# Patient Record
Sex: Male | Born: 1960 | Race: White | Hispanic: No | Marital: Married | Smoking: Former smoker
Health system: Southern US, Community
[De-identification: ages and names within clinical notes are randomized; demographics above are authoritative.]

## PROBLEM LIST (undated history)

## (undated) DIAGNOSIS — E785 Hyperlipidemia, unspecified: Secondary | ICD-10-CM

## (undated) DIAGNOSIS — Z86718 Personal history of other venous thrombosis and embolism: Secondary | ICD-10-CM

## (undated) DIAGNOSIS — I2699 Other pulmonary embolism without acute cor pulmonale: Secondary | ICD-10-CM

## (undated) DIAGNOSIS — R0600 Dyspnea, unspecified: Secondary | ICD-10-CM

## (undated) DIAGNOSIS — R0609 Other forms of dyspnea: Secondary | ICD-10-CM

## (undated) HISTORY — DX: Other forms of dyspnea: R06.09

## (undated) HISTORY — PX: TONSILLECTOMY AND ADENOIDECTOMY: SUR1326

## (undated) HISTORY — DX: Dyspnea, unspecified: R06.00

## (undated) HISTORY — PX: APPENDECTOMY: SHX54

---

## 2009-03-28 DIAGNOSIS — I2699 Other pulmonary embolism without acute cor pulmonale: Secondary | ICD-10-CM

## 2009-03-28 HISTORY — DX: Other pulmonary embolism without acute cor pulmonale: I26.99

## 2011-09-12 ENCOUNTER — Emergency Department: Payer: Self-pay | Admitting: *Deleted

## 2011-09-12 LAB — HCG, QUANTITATIVE, PREGNANCY: Beta Hcg, Quant.: <1 m[IU]/mL — ABNORMAL LOW

## 2012-02-28 DIAGNOSIS — F524 Premature ejaculation: Secondary | ICD-10-CM | POA: Insufficient documentation

## 2012-02-28 DIAGNOSIS — N529 Male erectile dysfunction, unspecified: Secondary | ICD-10-CM | POA: Insufficient documentation

## 2012-02-28 DIAGNOSIS — E7800 Pure hypercholesterolemia, unspecified: Secondary | ICD-10-CM | POA: Insufficient documentation

## 2012-02-28 DIAGNOSIS — Z86711 Personal history of pulmonary embolism: Secondary | ICD-10-CM | POA: Insufficient documentation

## 2012-03-07 DIAGNOSIS — Z720 Tobacco use: Secondary | ICD-10-CM | POA: Insufficient documentation

## 2012-03-07 DIAGNOSIS — M549 Dorsalgia, unspecified: Secondary | ICD-10-CM | POA: Insufficient documentation

## 2012-03-07 DIAGNOSIS — M79673 Pain in unspecified foot: Secondary | ICD-10-CM | POA: Insufficient documentation

## 2013-01-23 DIAGNOSIS — K635 Polyp of colon: Secondary | ICD-10-CM | POA: Insufficient documentation

## 2015-11-01 ENCOUNTER — Encounter: Payer: Self-pay | Admitting: Emergency Medicine

## 2015-11-01 ENCOUNTER — Emergency Department: Payer: BLUE CROSS/BLUE SHIELD

## 2015-11-01 ENCOUNTER — Emergency Department
Admission: EM | Admit: 2015-11-01 | Discharge: 2015-11-01 | Disposition: A | Discharge status: Home / Self Care | Payer: BLUE CROSS/BLUE SHIELD | Attending: Emergency Medicine | Admitting: Emergency Medicine

## 2015-11-01 DIAGNOSIS — W010XXA Fall on same level from slipping, tripping and stumbling without subsequent striking against object, initial encounter: Secondary | ICD-10-CM | POA: Diagnosis not present

## 2015-11-01 DIAGNOSIS — Y9259 Other trade areas as the place of occurrence of the external cause: Secondary | ICD-10-CM | POA: Insufficient documentation

## 2015-11-01 DIAGNOSIS — Y9301 Activity, walking, marching and hiking: Secondary | ICD-10-CM | POA: Diagnosis not present

## 2015-11-01 DIAGNOSIS — S99912A Unspecified injury of left ankle, initial encounter: Secondary | ICD-10-CM | POA: Diagnosis present

## 2015-11-01 DIAGNOSIS — S8265XA Nondisplaced fracture of lateral malleolus of left fibula, initial encounter for closed fracture: Secondary | ICD-10-CM | POA: Diagnosis not present

## 2015-11-01 DIAGNOSIS — S8262XA Displaced fracture of lateral malleolus of left fibula, initial encounter for closed fracture: Secondary | ICD-10-CM

## 2015-11-01 DIAGNOSIS — Y999 Unspecified external cause status: Secondary | ICD-10-CM | POA: Diagnosis not present

## 2015-11-01 MED ORDER — OXYCODONE-ACETAMINOPHEN 5-325 MG PO TABS
1.0000 | ORAL_TABLET | Freq: Once | ORAL | Status: AC
  Administered 2015-11-01: 1 via ORAL
  Filled 2015-11-01: qty 1

## 2015-11-01 MED ORDER — OXYCODONE-ACETAMINOPHEN 5-325 MG PO TABS: 1.0000 | ORAL_TABLET | ORAL | 0 refills | Status: DC | PRN

## 2015-11-01 NOTE — Discharge Instructions (Signed)
Ice and elevate your ankle. Use crutches for walking.  Leave splint on until seen by specialist.   Percocet as needed for pain as directed. Call Dr. Poggi/Dr. Troxler's office for an appointment.

## 2015-11-01 NOTE — ED Provider Notes (Signed)
[facility] Emergency Department Provider Note  ____________________________________________   First MD Initiated Contact with Patient 11/01/15 1633     (approximate)  I have reviewed the triage vital signs and the nursing notes.   HISTORY  Chief Complaint Ankle Injury   HPI Frank Rivera is a 55 y.o. male is here with complaint of left ankle pain. Patient states that he was out in his crotch when his right foot twisted on some cardboard causing him to fall injuring his left ankle. Patient denies any previous ankle fractures. Patient has not taken any over-the-counter medication prior to his arrival in the emergency room. He denies any head injury or loss of consciousness. There is been no nausea or vomiting.Range of motion increases patient's pain. Currently he rates his pain as 10 over 10. He denies any other injuries.   History reviewed. No pertinent past medical history.  There are no active problems to display for this patient.   History reviewed. No pertinent surgical history.  Prior to Admission medications   Medication Sig Start Date End Date Taking? Authorizing Provider  oxyCODONE-acetaminophen (PERCOCET) 5-325 MG tablet Take 1-2 tablets by mouth every 4 (four) hours as needed for severe pain. 11/01/15   Tommi Rumpshonda L Doreatha Offer, PA-C    Allergies Review of patient's allergies indicates no known allergies.  No family history on file.  Social History Social History  Substance Use Topics  . Smoking status: Never Smoker  . Smokeless tobacco: Never Used  . Alcohol use Not on file    Review of Systems Constitutional: No fever/chills Cardiovascular: Denies chest pain. Respiratory: Denies shortness of breath. Gastrointestinal:   No nausea, no vomiting.  Musculoskeletal: Negative for back pain. Positive left ankle pain. Skin: Negative for rash. Neurological: Negative for headaches, focal weakness or numbness.  10-point ROS otherwise  negative.  ____________________________________________   PHYSICAL EXAM:  VITAL SIGNS: ED Triage Vitals  Enc Vitals Group     BP 11/01/15 1626 (!) 150/63     Pulse --      Resp 11/01/15 1626 16     Temp 11/01/15 1626 97.8 F (36.6 C)     Temp Source 11/01/15 1626 Oral     SpO2 11/01/15 1626 97 %     Weight 11/01/15 1626 240 lb (108.9 kg)     Height 11/01/15 1626 5\' 9"  (1.753 m)     Head Circumference --      Peak Flow --      Pain Score 11/01/15 1624 10     Pain Loc --      Pain Edu? --      Excl. in GC? --     Constitutional: Alert and oriented. Well appearing and in no acute distress. Eyes: Conjunctivae are normal. PERRL. EOMI. Head: Atraumatic. Nose: No congestion/rhinnorhea. Neck: No stridor.   Cardiovascular: Normal rate, regular rhythm. Grossly normal heart sounds.  Good peripheral circulation. Respiratory: Normal respiratory effort.  No retractions. Lungs CTAB. Musculoskeletal: Examination of the left ankle there is moderate tenderness on palpation of the lateral aspect with soft tissue edema present. Range of motion is restricted secondary to discomfort. There is no injury to the skin, no ecchymosis, no abrasions, no erythema. Skin is warm and dry and pulses positive. Motor sensory function intact. Neurologic:  Normal speech and language. No gross focal neurologic deficits are appreciated. No gait instability. Skin:  Skin is warm, dry and intact. As above with muscle skeletal. Psychiatric: Mood and affect are normal. Speech and  behavior are normal.  ____________________________________________   LABS (all labs ordered are listed, but only abnormal results are displayed)  Labs Reviewed - No data to display RADIOLOGY  Left ankle x-ray per radiologist Nondisplaced lateral malleolus fracture. I, Tommi Rumps, personally viewed and evaluated these images (plain radiographs) as part of my medical decision making, as well as reviewing the written report by the  radiologist. ____________________________________________   PROCEDURES  Procedure(s) performed: None  Procedures  Critical Care performed: No  ____________________________________________   INITIAL IMPRESSION / ASSESSMENT AND PLAN / ED COURSE  Pertinent labs & imaging results that were available during my care of the patient were reviewed by me and considered in my medical decision making (see chart for details).    Clinical Course   Patient was given Percocet for pain while in the emergency room. Patient was placed in a posterior OCL splint along with a stirrup splint and crutches. Patient is to keep the extremity elevated to decrease swelling. He will continue his Percocet as needed for pain. He also has seen Dr. Orland Jarred in the past for various reasons and will call him/Poggi for further treatment of his fractured ankle.  ____________________________________________   FINAL CLINICAL IMPRESSION(S) / ED DIAGNOSES  Final diagnoses:  Ankle fracture, lateral malleolus, closed, left, initial encounter      NEW MEDICATIONS STARTED DURING THIS VISIT:  New Prescriptions   OXYCODONE-ACETAMINOPHEN (PERCOCET) 5-325 MG TABLET    Take 1-2 tablets by mouth every 4 (four) hours as needed for severe pain.     Note:  This document was prepared using Dragon voice recognition software and may include unintentional dictation errors.    Tommi Rumps, PA-C 11/01/15 1810    Phineas Semen, MD 11/09/15 [phone]

## 2015-11-01 NOTE — ED Triage Notes (Signed)
Slipped while walking in garage, injured left ankle

## 2015-11-17 ENCOUNTER — Encounter: Payer: Self-pay | Admitting: Emergency Medicine

## 2015-11-17 ENCOUNTER — Emergency Department: Payer: BLUE CROSS/BLUE SHIELD

## 2015-11-17 ENCOUNTER — Inpatient Hospital Stay
Admission: EM | Admit: 2015-11-17 | Discharge: 2015-11-23 | DRG: 175 | Disposition: A | Discharge status: Home / Self Care | Payer: BLUE CROSS/BLUE SHIELD | Attending: Internal Medicine | Admitting: Internal Medicine

## 2015-11-17 DIAGNOSIS — Z79899 Other long term (current) drug therapy: Secondary | ICD-10-CM | POA: Diagnosis not present

## 2015-11-17 DIAGNOSIS — S82892A Other fracture of left lower leg, initial encounter for closed fracture: Secondary | ICD-10-CM | POA: Diagnosis present

## 2015-11-17 DIAGNOSIS — X58XXXA Exposure to other specified factors, initial encounter: Secondary | ICD-10-CM | POA: Diagnosis present

## 2015-11-17 DIAGNOSIS — E785 Hyperlipidemia, unspecified: Secondary | ICD-10-CM | POA: Diagnosis present

## 2015-11-17 DIAGNOSIS — I248 Other forms of acute ischemic heart disease: Secondary | ICD-10-CM | POA: Diagnosis present

## 2015-11-17 DIAGNOSIS — Z86711 Personal history of pulmonary embolism: Secondary | ICD-10-CM

## 2015-11-17 DIAGNOSIS — R000 Tachycardia, unspecified: Secondary | ICD-10-CM | POA: Diagnosis present

## 2015-11-17 DIAGNOSIS — Z882 Allergy status to sulfonamides status: Secondary | ICD-10-CM | POA: Diagnosis not present

## 2015-11-17 DIAGNOSIS — I82402 Acute embolism and thrombosis of unspecified deep veins of left lower extremity: Secondary | ICD-10-CM | POA: Diagnosis present

## 2015-11-17 DIAGNOSIS — R0602 Shortness of breath: Secondary | ICD-10-CM | POA: Diagnosis present

## 2015-11-17 DIAGNOSIS — J9601 Acute respiratory failure with hypoxia: Secondary | ICD-10-CM | POA: Diagnosis present

## 2015-11-17 DIAGNOSIS — D72829 Elevated white blood cell count, unspecified: Secondary | ICD-10-CM | POA: Diagnosis present

## 2015-11-17 DIAGNOSIS — M7989 Other specified soft tissue disorders: Secondary | ICD-10-CM | POA: Diagnosis present

## 2015-11-17 DIAGNOSIS — F17210 Nicotine dependence, cigarettes, uncomplicated: Secondary | ICD-10-CM | POA: Diagnosis present

## 2015-11-17 DIAGNOSIS — I2699 Other pulmonary embolism without acute cor pulmonale: Principal | ICD-10-CM | POA: Diagnosis present

## 2015-11-17 DIAGNOSIS — R609 Edema, unspecified: Secondary | ICD-10-CM

## 2015-11-17 DIAGNOSIS — R9389 Abnormal findings on diagnostic imaging of other specified body structures: Secondary | ICD-10-CM

## 2015-11-17 HISTORY — DX: Other pulmonary embolism without acute cor pulmonale: I26.99

## 2015-11-17 LAB — PROTIME-INR
INR: 1.01
Prothrombin Time: 13.3 seconds (ref 11.4–15.2)

## 2015-11-17 LAB — BRAIN NATRIURETIC PEPTIDE: B Natriuretic Peptide: 27 pg/mL (ref 0.0–100.0)

## 2015-11-17 LAB — CBC
HCT: 39.5 % — ABNORMAL LOW (ref 40.0–52.0)
Hemoglobin: 14 g/dL (ref 13.0–18.0)
MCH: 33.5 pg (ref 26.0–34.0)
MCHC: 35.5 g/dL (ref 32.0–36.0)
MCV: 94.4 fL (ref 80.0–100.0)
PLATELETS: 270 10*3/uL (ref 150–440)
RBC: 4.18 MIL/uL — ABNORMAL LOW (ref 4.40–5.90)
RDW: 12.2 % (ref 11.5–14.5)
WBC: 8.6 10*3/uL (ref 3.8–10.6)

## 2015-11-17 LAB — BASIC METABOLIC PANEL
Anion gap: 10 (ref 5–15)
BUN: 13 mg/dL (ref 6–20)
CALCIUM: 9.3 mg/dL (ref 8.9–10.3)
CO2: 27 mmol/L (ref 22–32)
CREATININE: 0.73 mg/dL (ref 0.61–1.24)
Chloride: 103 mmol/L (ref 101–111)
GFR calc Af Amer: >60 mL/min (ref >60)
GFR calc non Af Amer: >60 mL/min (ref >60)
GLUCOSE: 122 mg/dL — AB (ref 65–99)
Potassium: 3.5 mmol/L (ref 3.5–5.1)
Sodium: 140 mmol/L (ref 135–145)

## 2015-11-17 LAB — TROPONIN I

## 2015-11-17 MED ORDER — ACETAMINOPHEN 325 MG PO TABS
650.0000 mg | ORAL_TABLET | Freq: Four times a day (QID) | ORAL | Status: DC | PRN
Start: 2015-11-17 — End: 2015-11-23
  Administered 2015-11-18: 650 mg via ORAL
  Filled 2015-11-17: qty 2

## 2015-11-17 MED ORDER — ACETAMINOPHEN 650 MG RE SUPP: 650.0000 mg | Freq: Four times a day (QID) | RECTAL | Status: DC | PRN

## 2015-11-17 MED ORDER — ONDANSETRON HCL 4 MG PO TABS: 4.0000 mg | ORAL_TABLET | Freq: Four times a day (QID) | ORAL | Status: DC | PRN

## 2015-11-17 MED ORDER — SODIUM CHLORIDE 0.9% FLUSH
3.0000 mL | Freq: Two times a day (BID) | INTRAVENOUS | Status: DC
  Administered 2015-11-17 – 2015-11-23 (×10): 3 mL via INTRAVENOUS

## 2015-11-17 MED ORDER — IOPAMIDOL (ISOVUE-370) INJECTION 76%
75.0000 mL | Freq: Once | INTRAVENOUS | Status: AC | PRN
  Administered 2015-11-17: 75 mL via INTRAVENOUS

## 2015-11-17 MED ORDER — ONDANSETRON HCL 4 MG/2ML IJ SOLN: 4.0000 mg | Freq: Four times a day (QID) | INTRAMUSCULAR | Status: DC | PRN

## 2015-11-17 MED ORDER — IPRATROPIUM-ALBUTEROL 0.5-2.5 (3) MG/3ML IN SOLN
3.0000 mL | Freq: Once | RESPIRATORY_TRACT | Status: AC
  Administered 2015-11-17: 3 mL via RESPIRATORY_TRACT
  Filled 2015-11-17: qty 3

## 2015-11-17 MED ORDER — FENTANYL CITRATE (PF) 100 MCG/2ML IJ SOLN
100.0000 ug | INTRAMUSCULAR | Status: DC | PRN
  Administered 2015-11-17 (×3): 100 ug via INTRAVENOUS
  Filled 2015-11-17 (×3): qty 2

## 2015-11-17 MED ORDER — HEPARIN (PORCINE) IN NACL 100-0.45 UNIT/ML-% IJ SOLN
2500.0000 [IU]/h | INTRAMUSCULAR | Status: DC
  Administered 2015-11-17: 1600 [IU]/h via INTRAVENOUS
  Administered 2015-11-18: 1950 [IU]/h via INTRAVENOUS
  Administered 2015-11-19: 2150 [IU]/h via INTRAVENOUS
  Administered 2015-11-19 – 2015-11-23 (×9): 2500 [IU]/h via INTRAVENOUS
  Filled 2015-11-17 (×18): qty 250

## 2015-11-17 MED ORDER — HEPARIN SODIUM (PORCINE) 5000 UNIT/ML IJ SOLN: 4000.0000 [IU] | Freq: Once | INTRAMUSCULAR | Status: DC

## 2015-11-17 MED ORDER — SIMVASTATIN 40 MG PO TABS
40.0000 mg | ORAL_TABLET | Freq: Every day | ORAL | Status: DC
  Administered 2015-11-17 – 2015-11-23 (×7): 40 mg via ORAL
  Filled 2015-11-17 (×7): qty 1

## 2015-11-17 MED ORDER — HEPARIN BOLUS VIA INFUSION
5000.0000 [IU] | Freq: Once | INTRAVENOUS | Status: AC
  Administered 2015-11-17: 5000 [IU] via INTRAVENOUS
  Filled 2015-11-17: qty 5000

## 2015-11-17 MED ORDER — SODIUM CHLORIDE 0.9 % IV BOLUS (SEPSIS)
1000.0000 mL | Freq: Once | INTRAVENOUS | Status: AC
  Administered 2015-11-17: 1000 mL via INTRAVENOUS

## 2015-11-17 MED ORDER — OXYCODONE HCL 5 MG PO TABS
5.0000 mg | ORAL_TABLET | ORAL | Status: DC | PRN
  Administered 2015-11-17 – 2015-11-23 (×29): 5 mg via ORAL
  Filled 2015-11-17 (×30): qty 1

## 2015-11-17 NOTE — ED Notes (Signed)
Patient transported to CT 

## 2015-11-17 NOTE — Progress Notes (Addendum)
ANTICOAGULATION CONSULT NOTE - Initial Consult  Pharmacy Consult for heparin  Indication: pulmonary embolus  Allergies  Allergen Reactions  . Sulfa Antibiotics Other (See Comments)    Patient Measurements: Height: 5\' 9"  (175.3 cm) Weight: 240 lb (108.9 kg) IBW/kg (Calculated) : 70.7 Heparin Dosing Weight: 94.5 kg  Vital Signs: Temp: 98.2 F (36.8 C) (08/22 1655) Temp Source: Oral (08/22 1655) BP: 130/71 (08/22 1727) Pulse Rate: 101 (08/22 1815)  Labs:  Recent Labs  11/17/15 1657 11/17/15 1736  HGB 14.0  --   HCT 39.5*  --   PLT 270  --   LABPROT  --  13.3  INR  --  1.01  CREATININE 0.73  --   TROPONINI <0.03  --     Estimated Creatinine Clearance: 129.9 mL/min (by C-G formula based on SCr of 0.8 mg/dL).   Medical History: Past Medical History:  Diagnosis Date  . Pulmonary embolism (HCC) 2011    Medications:  Scheduled:  . heparin  5,000 Units Intravenous Once  . sodium chloride flush  3 mL Intravenous Q12H    Assessment: 55 yo male who presents to the ED with chest pain and SOB. Not any any previous anticoagulants prior to admission.   Goal of Therapy:  Heparin level 0.3-0.7 units/ml Monitor platelets by anticoagulation protocol: Yes   Plan:  Give 5000 units bolus x 1 Start heparin infusion at 1600 units/hr Check anti-Xa level in 6 hours and daily while on heparin Continue to monitor H&H and platelets  Delsa BernKelly m Fuhrmann 11/17/2015,7:06 PM

## 2015-11-17 NOTE — H&P (Signed)
[facility]   PATIENT NAME: Frank Rivera    MR#:  [id number]  DATE OF BIRTH:  11/11/1960   DATE OF ADMISSION:  11/17/2015  PRIMARY CARE PHYSICIAN: No PCP Per Patient   REQUESTING/REFERRING PHYSICIAN: Roxan Hockeyobinson  CHIEF COMPLAINT:   Chief Complaint  Patient presents with  . Chest Pain  . Shortness of Breath    HISTORY OF PRESENT ILLNESS:  Frank BoastGary Rivera  is a 55 y.o. male with a known history of Prior provoked pulmonary embolism back in 2001 who is presenting with chest pain shortness of breath. Recently suffered an ankle fracture a few weeks ago he has essentially been immobile since mainly using a wheelchair. He is now complaining of 2-3 day duration of chest pain shortness of breath. Chest pain originally right sided pleuritic in nature also worse with movement sharp in quality and intensity 7/10. Associated shortness of breath mainly with activity. Present Hospital further workup and evaluation.  PAST MEDICAL HISTORY:   Past Medical History:  Diagnosis Date  . Pulmonary emboli (HCC)   . Pulmonary embolism (HCC) 2011    PAST SURGICAL HISTORY:  History reviewed. No pertinent surgical history.  SOCIAL HISTORY:   Social History  Substance Use Topics  . Smoking status: Current Every Day Smoker    Types: Cigarettes  . Smokeless tobacco: Never Used  . Alcohol use No    FAMILY HISTORY:   Family History  Problem Relation Age of Onset  . Diabetes Neg Hx     DRUG ALLERGIES:   Allergies  Allergen Reactions  . Sulfa Antibiotics Other (See Comments)    REVIEW OF SYSTEMS:  REVIEW OF SYSTEMS:  CONSTITUTIONAL: Denies fevers, chills, fatigue, weakness.  EYES: Denies blurred vision, double vision, or eye pain.  EARS, NOSE, THROAT: Denies tinnitus, ear pain, hearing loss.  RESPIRATORY: denies cough, Positive shortness of breath, denies wheezing  CARDIOVASCULAR: Positive chest pain, denies palpitations, edema.  GASTROINTESTINAL: Denies  nausea, vomiting, diarrhea, abdominal pain.  GENITOURINARY: Denies dysuria, hematuria.  ENDOCRINE: Denies nocturia or thyroid problems. HEMATOLOGIC AND LYMPHATIC: Denies easy bruising or bleeding.  SKIN: Denies rash or lesions.  MUSCULOSKELETAL: Denies pain in neck, back, shoulder, knees, hips, or further arthritic symptoms.  NEUROLOGIC: Denies paralysis, paresthesias.  PSYCHIATRIC: Denies anxiety or depressive symptoms. Otherwise full review of systems performed by me is negative.   MEDICATIONS AT HOME:   Prior to Admission medications   Medication Sig Start Date End Date Taking? Authorizing Provider  ibuprofen (ADVIL,MOTRIN) 200 MG tablet Take 200 mg by mouth every 6 (six) hours as needed.   Yes Historical Provider, MD  simvastatin (ZOCOR) 40 MG tablet Take 40 mg by mouth daily.   Yes Historical Provider, MD  oxyCODONE-acetaminophen (PERCOCET) 5-325 MG tablet Take 1-2 tablets by mouth every 4 (four) hours as needed for severe pain. Patient not taking: Reported on 11/17/2015 11/01/15   Tommi Rumpshonda L Summers, PA-C      VITAL SIGNS:  Blood pressure (!) 155/77, pulse (!) 117, temperature 98.2 F (36.8 C), temperature source Oral, resp. rate (!) 32, height 5\' 9"  (1.753 m), weight 108.9 kg (240 lb), SpO2 92 %.  PHYSICAL EXAMINATION:  VITAL SIGNS: Vitals:   11/17/15 1912 11/17/15 1913  BP: (!) 155/77   Pulse: (!) 116 (!) 117  Resp: (!) 32 (!) 32  Temp:     GENERAL:55 y.o.male currently in no acute distress.  HEAD: Normocephalic, atraumatic.  EYES: Pupils equal, round, reactive to light. Extraocular muscles intact. No  scleral icterus.  MOUTH: Moist mucosal membrane. Dentition intact. No abscess noted.  EAR, NOSE, THROAT: Clear without exudates. No external lesions.  NECK: Supple. No thyromegaly. No nodules. No JVD.  PULMONARY: Clear to ascultation, without wheeze rails or rhonci. No use of accessory muscles, Good respiratory effort. good air entry bilaterally CHEST: Nontender to  palpation.  CARDIOVASCULAR: S1 and S2. Tachycardic No murmurs, rubs, or gallops. No edema. Pedal pulses 2+ bilaterally.  GASTROINTESTINAL: Soft, nontender, nondistended. No masses. Positive bowel sounds. No hepatosplenomegaly.  MUSCULOSKELETAL: No swelling, clubbing, or edema. Range of motion full in all extremities.  NEUROLOGIC: Cranial nerves II through XII are intact. No gross focal neurological deficits. Sensation intact. Reflexes intact.  SKIN: No ulceration, lesions, rashes, or cyanosis. Skin warm and dry. Turgor intact.  PSYCHIATRIC: Mood, affect within normal limits. The patient is awake, alert and oriented x 3. Insight, judgment intact.    LABORATORY PANEL:   CBC  Recent Labs Lab 11/17/15 1657  WBC 8.6  HGB 14.0  HCT 39.5*  PLT 270   ------------------------------------------------------------------------------------------------------------------  Chemistries   Recent Labs Lab 11/17/15 1657  NA 140  K 3.5  CL 103  CO2 27  GLUCOSE 122*  BUN 13  CREATININE 0.73  CALCIUM 9.3   ------------------------------------------------------------------------------------------------------------------  Cardiac Enzymes  Recent Labs Lab 11/17/15 1657  TROPONINI <0.03   ------------------------------------------------------------------------------------------------------------------  RADIOLOGY:  Dg Chest 2 View  Result Date: 11/17/2015 CLINICAL DATA:  Worsening shortness of breath and left-sided chest pain. EXAM: CHEST  2 VIEW COMPARISON:  None. FINDINGS: Cardiomediastinal silhouette is normal. Mediastinal contours appear intact. There is no evidence of pneumothorax. Ill-defined focal asymmetry seen in the right lung apex. Linear opacities are also noted in the left mid lung field. Osseous structures are without acute abnormality. Soft tissues are grossly normal. IMPRESSION: Ill-defined opacity in the right lung apex, which may represent pleural parenchymal scarring, focal  consolidation, loculated pleural effusion or pulmonary mass. Linear opacities in the left midlung may represent scarring versus atelectasis. If patient presents with findings suggestive of acute pneumonia, follow-up after empiric treatment is recommended. Electronically Signed   By: Ted Mcalpineobrinka  Dimitrova M.D.   On: 11/17/2015 17:52   Ct Angio Chest Pe W And/or Wo Contrast  Result Date: 11/17/2015 CLINICAL DATA:  Chest pain starting 3 hours ago. EXAM: CT ANGIOGRAPHY CHEST WITH CONTRAST TECHNIQUE: Multidetector CT imaging of the chest was performed using the standard protocol during bolus administration of intravenous contrast. Multiplanar CT image reconstructions and MIPs were obtained to evaluate the vascular anatomy. CONTRAST:  75 cc Isovue 370 intravenously. COMPARISON:  Chest radiograph 11/17/2015 FINDINGS: Mediastinum/Lymph Nodes: There are bilateral central pulmonary emboli with heavy clot burden. A near occlusive large pulmonary embolus is seen in the left main pulmonary artery extending to all lobar branches with occlusive pulmonary emboli within the left lower and lingular lobar and segmental branches. There are slightly more peripheral nonocclusive pulmonary emboli on the right involving the takeoff of all lobar branches. There is near occlusive pulmonary embolus within a segmental branch in the right lower lobe. No evidence of right heart strain. Lungs/Pleura: There is a cavitary subpleural mass in the lateral aspect of the right upper lobe measuring 4.2 x 2.2 x 2.1 cm. Peripheral pleural based patchy areas of airspace consolidation are seen in the left lower lobe and lingula, and in the lateral aspect of the left upper lobe. Upper abdomen: No acute findings. Musculoskeletal: No chest wall mass or suspicious bone lesions identified. Review of the MIP images confirms  the above findings. IMPRESSION: Bilateral pulmonary emboli with having clot burden. No evidence of right heart strain. Cavitary subpleural  mass in the lateral aspect of the right upper lobe which may represent area of lung necrosis due to hypoperfusion, lung abscess or cavitary primary lung malignancy. Areas of patchy airspace consolidation in the periphery of the left lung may represent sequela of hypoperfusion. Critical Value/emergent results were called by telephone at the time of interpretation on 11/17/2015 at 7:02 pm to Dr. Willy Eddy , who verbally acknowledged these results. Electronically Signed   By: Ted Mcalpine M.D.   On: 11/17/2015 19:06    EKG:   Orders placed or performed during the hospital encounter of 11/17/15  . EKG 12-Lead  . EKG 12-Lead  . ED EKG within 10 minutes  . ED EKG within 10 minutes    IMPRESSION AND PLAN:   55 year old Caucasian gentleman recent ankle fracture presenting with chest pain shortness of breath  1. Provoked pulmonary embolism: Started on therapeutic heparin patient is symptomatic min both breathing and heart rate, plan on transitioning to oral anticoagulation the morning if he is asymptomatic then discharge excellent   2. Hyperlipidemia unspeficied: zocor  All the records are reviewed and case discussed with ED provider. Management plans discussed with the patient, family and they are in agreement.  CODE STATUS: full  TOTAL TIME TAKING CARE OF THIS PATIENT: 33 minutes.    Cheryl Stabenow,  Mardi Mainland.D on 11/17/2015 at 7:33 PM  Between 7am to 6pm - Pager - [phone]  After 6pm: House Pager: - [phone]  [facility]  Office  [phone]  CC: Primary care physician; No PCP Per Patient

## 2015-11-17 NOTE — ED Provider Notes (Signed)
[facility] Emergency Department Provider Note    First MD Initiated Contact with Patient 11/17/15 1726     (approximate)  I have reviewed the triage vital signs and the nursing notes.   HISTORY  Chief Complaint Chest Pain and Shortness of Breath    HPI Frank Rivera is a 55 y.o. male who presents with diffuse chest pain that is pleuritic in nature. Rates it currently as a 10 out of 10 and patient is unable to take a deep breath. Describes the pain as pleuritic and radiating from the right side to the left when taking a deep breath. Denies any cough. States he's had a night fevers for the past several nights. Patient was recently seen at beginning of this month for a left ankle fracture and has been in a immobilizer. Since then. Patient also has a history of pulmonary embolism in 2011 is not on any anticoagulation at this time. Denies any abdominal pain, nausea or vomiting. He is a one pack per day smoker.   Past Medical History:  Diagnosis Date  . Pulmonary embolism [facility]) 2011    Patient Active Problem List   Diagnosis Date Noted  . Acute pulmonary embolism (HCC) 11/17/2015    History reviewed. No pertinent surgical history.  Prior to Admission medications   Medication Sig Start Date End Date Taking? Authorizing Provider  oxyCODONE-acetaminophen (PERCOCET) 5-325 MG tablet Take 1-2 tablets by mouth every 4 (four) hours as needed for severe pain. 11/01/15   Tommi Rumps, PA-C    Allergies Sulfa antibiotics  No family h/o bleeding disorders  Social History Social History  Substance Use Topics  . Smoking status: Current Every Day Smoker    Types: Cigarettes  . Smokeless tobacco: Never Used  . Alcohol use No    Review of Systems Patient denies headaches, rhinorrhea, blurry vision, numbness, shortness of breath, chest pain, edema, cough, abdominal pain, nausea, vomiting, diarrhea, dysuria, fevers, rashes or hallucinations unless otherwise  stated above in HPI. ____________________________________________   PHYSICAL EXAM:  VITAL SIGNS: Vitals:   11/17/15 1740 11/17/15 1815  BP:    Pulse:  (!) 101  Resp: (!) 23   Temp:      Constitutional: Alert and oriented. Mild respiratory distress Eyes: Conjunctivae are normal. PERRL. EOMI. Head: Atraumatic. Nose: No congestion/rhinnorhea. Mouth/Throat: Mucous membranes are moist.  Oropharynx non-erythematous. Neck: No stridor. Painless ROM. No cervical spine tenderness to palpation Hematological/Lymphatic/Immunilogical: No cervical lymphadenopathy. Cardiovascular: Normal rate, regular rhythm. Grossly normal heart sounds.  Good peripheral circulation. Respiratory: Normal respiratory effort.  No retractions. Coarse bibasilar breath sounds. Gastrointestinal: Soft and nontender. No distention. No abdominal bruits. No CVA tenderness. Genitourinary:  Musculoskeletal: LLE in ortho boot, SILT distally, brisk cap refill.  No joint effusions. Neurologic:  Normal speech and language. No gross focal neurologic deficits are appreciated. No gait instability. Skin:  Skin is warm, dry and intact. No rash noted. Psychiatric: Mood and affect are normal. Speech and behavior are normal.  ____________________________________________   LABS (all labs ordered are listed, but only abnormal results are displayed)  Results for orders placed or performed during the hospital encounter of 11/17/15 (from the past 24 hour(s))  Basic metabolic panel     Status: Abnormal   Collection Time: 11/17/15  4:57 PM  Result Value Ref Range   Sodium 140 135 - 145 mmol/L   Potassium 3.5 3.5 - 5.1 mmol/L   Chloride 103 101 - 111 mmol/L   CO2 27 22 - 32  mmol/L   Glucose, Bld 122 (H) 65 - 99 mg/dL   BUN 13 6 - 20 mg/dL   Creatinine, Ser 0.73 0.61 - 1.24 mg/dL   Calcium 9.3 8.9 - 81.110.34.09 mg/dL   GFR calc non Af Amer >60 >60 mL/min   GFR calc Af Amer >60 >60 mL/min   Anion gap 10 5 - 15  CBC     Status: Abnormal    Collection Time: 11/17/15  4:57 PM  Result Value Ref Range   WBC 8.6 3.8 - 10.6 K/uL   RBC 4.18 (L) 4.40 - 5.90 MIL/uL   Hemoglobin 14.0 13.0 - 18.0 g/dL   HCT 91.439.5 (L) 78.240.0 - 95.652.0 %   MCV 94.4 80.0 - 100.0 fL   MCH 33.5 26.0 - 34.0 pg   MCHC 35.5 32.0 - 36.0 g/dL   RDW 21.312.2 08.611.5 - 57.814.5 %   Platelets 270 150 - 440 K/uL  Troponin I     Status: None   Collection Time: 11/17/15  4:57 PM  Result Value Ref Range   Troponin I <0.03 <0.03 ng/mL  Brain natriuretic peptide     Status: None   Collection Time: 11/17/15  4:57 PM  Result Value Ref Range   B Natriuretic Peptide 27.0 0.0 - 100.0 pg/mL  Protime-INR     Status: None   Collection Time: 11/17/15  5:36 PM  Result Value Ref Range   Prothrombin Time 13.3 11.4 - 15.2 seconds   INR 1.01    ____________________________________________  EKG My review and personal interpretation at Time: 16:54   Indication: SOB  Rate: 120  Rhythm: SInus Axis: normal Other: no acute ischemic changes. ____________________________________________  RADIOLOGY CXR my read shows no evidence of cardiomegaly, patchy infiltrate in RLL. CT chest with evidence of acute PE. ____________________________________________   PROCEDURES  Procedure(s) performed: none    Critical Care performed: yes CRITICAL CARE Performed by: Willy EddyPatrick Arnice Vanepps   Total critical care time: 45 minutes  Critical care time was exclusive of separately billable procedures and treating other patients.  Critical care was necessary to treat or prevent imminent or life-threatening deterioration.  Critical care was time spent personally by me on the following activities: development of treatment plan with patient and/or surrogate as well as nursing, discussions with consultants, evaluation of patient's response to treatment, examination of patient, obtaining history from patient or surrogate, ordering and performing treatments and interventions, ordering and review of laboratory  studies, ordering and review of radiographic studies, pulse oximetry and re-evaluation of patient's condition.  ____________________________________________   INITIAL IMPRESSION / ASSESSMENT AND PLAN / ED COURSE  Pertinent labs & imaging results that were available during my care of the patient were reviewed by me and considered in my medical decision making (see chart for details).  DDX: Pe, acs, pna, bronchitis, anemia, costochondritis  Gatha MayerGary A Navarrete is a 55 y.o. who presents to the ED with chief complaint of shortness of breath as well as pleuritic chest pain. Patient arrives afebrile but is tachycardic into The ER. No overt hypoxia. Patient does have a history of PE place him at high rate recurrent PE particularly given his recent immobilization of his left lower extremity not on anticoagulation. No infectious process is considered patient will need CTA of his chest to further characterize any pulmonary embolism. We'll provide patient with albuterol inhalers as well as IV pain medication and IV fluids for resuscitation.  Clinical Course  Comment By Time  I reviewed the CT imaging and does show  that he has an acute pulmonary embolism with extensive clot burden but is not a saddle embolism. We will start patient on heparin bolus and infusion. There is also evidence of a loculated infiltrate in the right apical lung possibly mass versus pneumonia. I favor mass based on his extensive smoking history and recurrent pulmonary embolism. Willy EddyPatrick Madalin Hughart, MD 08/22 1851  There is also evidence of probable cavitary lesion in the right apical lung concerning for pulmonary infarct versus a cavitary lung mass.  I spoke with Dr. Clint GuyHower who agrees to admit patient for further eval and management.  Patient acute hypoxic respiratory failure requiring supplemental O2.  No hypotension at this time.  Trop and BNP normal.  Have discussed with the patient and available family all diagnostics and treatments performed  thus far and all questions were answered to the best of my ability. The patient demonstrates understanding and agreement with plan.  Willy EddyPatrick Geena Weinhold, MD 08/22 1903     ____________________________________________   FINAL CLINICAL IMPRESSION(S) / ED DIAGNOSES  Final diagnoses:  Other acute pulmonary embolism without acute cor pulmonale (HCC)  Shortness of breath  Tachycardia  Abnormal CT scan, chest  Acute respiratory failure with hypoxia (HCC)      NEW MEDICATIONS STARTED DURING THIS VISIT:  New Prescriptions   No medications on file     Note:  This document was prepared using Dragon voice recognition software and may include unintentional dictation errors.    Willy EddyPatrick Izora Benn, MD 11/17/15 Windell [id number]

## 2015-11-17 NOTE — ED Triage Notes (Signed)
Pt reports chest pain started about 3 hours PTA, reports left sided and states "it just hurts". Pt also short of breath, hyperventilating in triage.

## 2015-11-18 ENCOUNTER — Inpatient Hospital Stay
Admit: 2015-11-18 | Discharge: 2015-11-18 | Disposition: A | Discharge status: Home / Self Care | Payer: BLUE CROSS/BLUE SHIELD | Attending: Internal Medicine | Admitting: Internal Medicine

## 2015-11-18 ENCOUNTER — Inpatient Hospital Stay: Payer: BLUE CROSS/BLUE SHIELD

## 2015-11-18 LAB — HEPARIN LEVEL (UNFRACTIONATED)
Heparin Unfractionated: 0.15 IU/mL — ABNORMAL LOW (ref 0.30–0.70)
Heparin Unfractionated: 0.39 IU/mL (ref 0.30–0.70)
Heparin Unfractionated: 0.32 IU/mL (ref 0.30–0.70)

## 2015-11-18 LAB — ECHOCARDIOGRAM COMPLETE
HEIGHTINCHES: 69 in
WEIGHTICAEL: 3840 [oz_av]

## 2015-11-18 LAB — COMPREHENSIVE METABOLIC PANEL
ALBUMIN: 3.5 g/dL (ref 3.5–5.0)
ALT: 27 U/L (ref 17–63)
ANION GAP: 9 (ref 5–15)
AST: 17 U/L (ref 15–41)
Alkaline Phosphatase: 90 U/L (ref 38–126)
BUN: 11 mg/dL (ref 6–20)
CHLORIDE: 100 mmol/L — AB (ref 101–111)
CO2: 23 mmol/L (ref 22–32)
Calcium: 9 mg/dL (ref 8.9–10.3)
Creatinine, Ser: 0.78 mg/dL (ref 0.61–1.24)
GFR calc Af Amer: >60 mL/min (ref >60)
GFR calc non Af Amer: >60 mL/min (ref >60)
GLUCOSE: 107 mg/dL — AB (ref 65–99)
POTASSIUM: 4.2 mmol/L (ref 3.5–5.1)
SODIUM: 132 mmol/L — AB (ref 135–145)
TOTAL PROTEIN: 7.4 g/dL (ref 6.5–8.1)
Total Bilirubin: 1.7 mg/dL — ABNORMAL HIGH (ref 0.3–1.2)

## 2015-11-18 LAB — CBC
HEMATOCRIT: 36.6 % — AB (ref 40.0–52.0)
Hemoglobin: 12.9 g/dL — ABNORMAL LOW (ref 13.0–18.0)
MCH: 33.1 pg (ref 26.0–34.0)
MCHC: 35.1 g/dL (ref 32.0–36.0)
MCV: 94.2 fL (ref 80.0–100.0)
Platelets: 257 10*3/uL (ref 150–440)
RBC: 3.89 MIL/uL — ABNORMAL LOW (ref 4.40–5.90)
RDW: 12.1 % (ref 11.5–14.5)
WBC: 11.6 10*3/uL — AB (ref 3.8–10.6)

## 2015-11-18 LAB — SEDIMENTATION RATE: Sed Rate: 74 mm/hr — ABNORMAL HIGH (ref 0–20)

## 2015-11-18 LAB — TROPONIN I
TROPONIN I: 0.05 ng/mL — AB (ref <0.03)
Troponin I: 0.06 ng/mL (ref <0.03)

## 2015-11-18 LAB — APTT: aPTT: 42 seconds — ABNORMAL HIGH (ref 24–36)

## 2015-11-18 LAB — MRSA PCR SCREENING: MRSA by PCR: NEGATIVE

## 2015-11-18 MED ORDER — VANCOMYCIN HCL IN DEXTROSE 1-5 GM/200ML-% IV SOLN
1000.0000 mg | Freq: Once | INTRAVENOUS | Status: AC
  Administered 2015-11-18: 1000 mg via INTRAVENOUS
  Filled 2015-11-18: qty 200

## 2015-11-18 MED ORDER — TUBERCULIN PPD 5 UNIT/0.1ML ID SOLN
5.0000 [IU] | Freq: Once | INTRADERMAL | Status: AC
  Administered 2015-11-18: 5 [IU] via INTRADERMAL
  Filled 2015-11-18: qty 0.1

## 2015-11-18 MED ORDER — VANCOMYCIN HCL IN DEXTROSE 1-5 GM/200ML-% IV SOLN
1000.0000 mg | Freq: Three times a day (TID) | INTRAVENOUS | Status: DC
  Administered 2015-11-18 – 2015-11-19 (×3): 1000 mg via INTRAVENOUS
  Filled 2015-11-18 (×5): qty 200

## 2015-11-18 MED ORDER — PIPERACILLIN-TAZOBACTAM 3.375 G IVPB
3.3750 g | Freq: Three times a day (TID) | INTRAVENOUS | Status: DC
  Administered 2015-11-18 – 2015-11-23 (×15): 3.375 g via INTRAVENOUS
  Filled 2015-11-18 (×17): qty 50

## 2015-11-18 MED ORDER — HEPARIN BOLUS VIA INFUSION
2900.0000 [IU] | Freq: Once | INTRAVENOUS | Status: AC
  Administered 2015-11-18: 2900 [IU] via INTRAVENOUS
  Filled 2015-11-18: qty 2900

## 2015-11-18 NOTE — Progress Notes (Addendum)
[facility] [place] [facility]   PATIENT NAME: Frank BoastGary Rivera    MR#:  [id number]  DATE OF BIRTH:  12/09/1960  SUBJECTIVE:  CHIEF COMPLAINT:   Chief Complaint  Patient presents with  . Chest Pain  . Shortness of Breath  Patient is a 55 year old Caucasian male with past medical history significant for history of pulmonary embolism in 2001, who presents to the hospital with complaints of chest pain and shortness of breath. In emergency room, he underwent CT scan of the chest which revealed cavitating lesion in the right upper lobe, pulmonary emboli. Patient admits of fevers, few days ago, chills and sweating, no significant weight loss. Recent ankle fracture on the left with swelling of left lower extremity, no ultrasound was performed yet.  Review of Systems  Constitutional: Positive for chills, fever and malaise/fatigue. Negative for weight loss.  HENT: Negative for congestion.   Eyes: Negative for blurred vision and double vision.  Respiratory: Positive for cough and shortness of breath. Negative for sputum production and wheezing.   Cardiovascular: Positive for chest pain and leg swelling. Negative for palpitations, orthopnea and PND.  Gastrointestinal: Negative for abdominal pain, blood in stool, constipation, diarrhea, nausea and vomiting.  Genitourinary: Negative for dysuria, frequency, hematuria and urgency.  Musculoskeletal: Negative for falls.  Neurological: Negative for dizziness, tremors, focal weakness and headaches.  Endo/Heme/Allergies: Does not bruise/bleed easily.  Psychiatric/Behavioral: Negative for depression. The patient does not have insomnia.     VITAL SIGNS: Blood pressure 140/77, pulse (!) 112, temperature 99.7 F (37.6 C), temperature source Oral, resp. rate (!) 22, height 5\' 9"  (1.753 m), weight 108.9 kg (240 lb), SpO2 93 %.  PHYSICAL EXAMINATION:   GENERAL:  55 y.o.-year-old patient lying in the bed in mild distress, dyspneic, but  able to speak long sentences.  EYES: Pupils equal, round, reactive to light and accommodation. No scleral icterus. Extraocular muscles intact.  HEENT: Head atraumatic, normocephalic. Oropharynx and nasopharynx clear.  NECK:  Supple, no jugular venous distention. No thyroid enlargement, no tenderness.  LUNGS: Normal breath sounds on the left, decreased on the right, extensive crackles on the right side of the lungs, some dullness to percussion, no wheezing, few scattered rales, rhonchi and crepitations on the right posteriorly and anteriorly . Intermittent use of accessory muscles of respiration.  CARDIOVASCULAR: S1, S2 normal. No murmurs, rubs, or gallops.  ABDOMEN: Soft, nontender, nondistended. Bowel sounds present. No organomegaly or mass.  EXTREMITIES: No pedal edema, cyanosis, or clubbing.  NEUROLOGIC: Cranial nerves II through XII are intact. Muscle strength 5/5 in all extremities. Sensation intact. Gait not checked.  PSYCHIATRIC: The patient is alert and oriented x 3.  SKIN: No obvious rash, lesion, or ulcer.   ORDERS/RESULTS REVIEWED:   CBC  Recent Labs Lab 11/17/15 1657 11/18/15 0325  WBC 8.6 11.6*  HGB 14.0 12.9*  HCT 39.5* 36.6*  PLT 270 257  MCV 94.4 94.2  MCH 33.5 33.1  MCHC 35.5 35.1  RDW 12.2 12.1   ------------------------------------------------------------------------------------------------------------------  Chemistries   Recent Labs Lab 11/17/15 1657  NA 140  K 3.5  CL 103  CO2 27  GLUCOSE 122*  BUN 13  CREATININE 0.73  CALCIUM 9.3   ------------------------------------------------------------------------------------------------------------------ estimated creatinine clearance is 129.9 mL/min (by C-G formula based on SCr of 0.8 mg/dL). ------------------------------------------------------------------------------------------------------------------ No results for input(s): TSH, T4TOTAL, T3FREE, THYROIDAB in the last 72 hours.  Invalid input(s):  FREET3  Cardiac Enzymes  Recent Labs Lab 11/17/15 1657 11/18/15 0149 11/18/15 0732  TROPONINI <0.03 0.05* 0.06*   ------------------------------------------------------------------------------------------------------------------ Invalid input(s): POCBNP ---------------------------------------------------------------------------------------------------------------  RADIOLOGY: Dg Chest 2 View  Result Date: 11/17/2015 CLINICAL DATA:  Worsening shortness of breath and left-sided chest pain. EXAM: CHEST  2 VIEW COMPARISON:  None. FINDINGS: Cardiomediastinal silhouette is normal. Mediastinal contours appear intact. There is no evidence of pneumothorax. Ill-defined focal asymmetry seen in the right lung apex. Linear opacities are also noted in the left mid lung field. Osseous structures are without acute abnormality. Soft tissues are grossly normal. IMPRESSION: Ill-defined opacity in the right lung apex, which may represent pleural parenchymal scarring, focal consolidation, loculated pleural effusion or pulmonary mass. Linear opacities in the left midlung may represent scarring versus atelectasis. If patient presents with findings suggestive of acute pneumonia, follow-up after empiric treatment is recommended. Electronically Signed   By: Ted Mcalpine M.D.   On: 11/17/2015 17:52   Ct Angio Chest Pe W And/or Wo Contrast  Result Date: 11/17/2015 CLINICAL DATA:  Chest pain starting 3 hours ago. EXAM: CT ANGIOGRAPHY CHEST WITH CONTRAST TECHNIQUE: Multidetector CT imaging of the chest was performed using the standard protocol during bolus administration of intravenous contrast. Multiplanar CT image reconstructions and MIPs were obtained to evaluate the vascular anatomy. CONTRAST:  75 cc Isovue 370 intravenously. COMPARISON:  Chest radiograph 11/17/2015 FINDINGS: Mediastinum/Lymph Nodes: There are bilateral central pulmonary emboli with heavy clot burden. A near occlusive large pulmonary embolus is  seen in the left main pulmonary artery extending to all lobar branches with occlusive pulmonary emboli within the left lower and lingular lobar and segmental branches. There are slightly more peripheral nonocclusive pulmonary emboli on the right involving the takeoff of all lobar branches. There is near occlusive pulmonary embolus within a segmental branch in the right lower lobe. No evidence of right heart strain. Lungs/Pleura: There is a cavitary subpleural mass in the lateral aspect of the right upper lobe measuring 4.2 x 2.2 x 2.1 cm. Peripheral pleural based patchy areas of airspace consolidation are seen in the left lower lobe and lingula, and in the lateral aspect of the left upper lobe. Upper abdomen: No acute findings. Musculoskeletal: No chest wall mass or suspicious bone lesions identified. Review of the MIP images confirms the above findings. IMPRESSION: Bilateral pulmonary emboli with having clot burden. No evidence of right heart strain. Cavitary subpleural mass in the lateral aspect of the right upper lobe which may represent area of lung necrosis due to hypoperfusion, lung abscess or cavitary primary lung malignancy. Areas of patchy airspace consolidation in the periphery of the left lung may represent sequela of hypoperfusion. Critical Value/emergent results were called by telephone at the time of interpretation on 11/17/2015 at 7:02 pm to Dr. Willy Eddy , who verbally acknowledged these results. Electronically Signed   By: Ted Mcalpine M.D.   On: 11/17/2015 19:06    EKG:  Orders placed or performed during the hospital encounter of 11/17/15  . EKG 12-Lead  . EKG 12-Lead  . ED EKG within 10 minutes  . ED EKG within 10 minutes    ASSESSMENT AND PLAN:  Active Problems:   Acute pulmonary embolism (HCC) #1. Acute respiratory failure with hypoxia due to pulmonary embolism and cavitating lesion, questionable lung abscess, continue oxygen, wean off as tolerated #2. Pulmonary  embolism, continue heparin for now, change to Coumadin/or liquids/Xarelto when pulmonary evaluations completed #3 cavitating lesion of unclear etiology at this time, suspected pneumonia, abscess versus cancer, pulmonary is consulted, recommendations are pending. Isolated patient, get AFBs, PPD, initiate antibiotic therapy  with vancomycin and Zosyn, get sputum culture #4. Leukocytosis, follow with antibiotic therapy #5. Elevated troponin, likely demand ischemia, get echocardiogram #6 Left lower extremity swelling due to ankle fracture, get ultrasound of left lower extremity to rule out DVT, may need to have vascular surgery involved   Management plans discussed with the patient, family and they are in agreement.   DRUG ALLERGIES:  Allergies  Allergen Reactions  . Sulfa Antibiotics Other (See Comments)    CODE STATUS:     Code Status Orders        Start     Ordered   11/17/15 1907  Full code  Continuous     11/17/15 1907    Code Status History    Date Active Date Inactive Code Status Order ID Comments User Context   This patient has a current code status but no historical code status.      TOTAL CRITICAL CARE TIME DUE TO COMPLEXITY OF PROBLEMS TAKING CARE OF THIS PATIENT: 40 minutes.  Discussed with patient's family and patient himself extensively. All questions were answered  Patti Shorb M.D on 11/18/2015 at 12:33 PM  Between 7am to 6pm - Pager - [phone]  After 6pm go to [email or web address] - password [email or web address] [facility]  [facility]  Office  [phone]  CC: Primary care physician; No PCP Per Patient

## 2015-11-18 NOTE — Progress Notes (Signed)
Pt heart rate sustaining in the 140's-150's pt symptomatic with BP elevated at 150/80 which was an increase from 138/82 at 1923, pt diaphoretic, fever that won't break even with tylenol on board. Pt does have heparin infusing for bilateral PE's and a DVT. MD paged, Dr. Anne HahnWillis to put in orders for a stat EKG. Will continue to monitor and await results of EKG. Shirley FriarAlexis Miller, RN

## 2015-11-18 NOTE — Progress Notes (Signed)
*  PRELIMINARY RESULTS* Echocardiogram 2D Echocardiogram has been performed.  Frank Rivera, Frank Rivera 11/18/2015, 2:32 PM

## 2015-11-18 NOTE — Progress Notes (Signed)
ANTICOAGULATION CONSULT NOTE  Pharmacy Consult for heparin  Indication: pulmonary embolus  Allergies  Allergen Reactions  . Sulfa Antibiotics Other (See Comments)    Patient Measurements: Height: 5\' 9"  (175.3 cm) Weight: 240 lb (108.9 kg) IBW/kg (Calculated) : 70.7 Heparin Dosing Weight: 94.5 kg  Vital Signs: BP: 140/77 (08/23 0920) Pulse Rate: 112 (08/23 0920)  Labs:  Recent Labs  11/17/15 1657 11/17/15 1736 11/18/15 0149 11/18/15 0325 11/18/15 0732 11/18/15 1103 11/18/15 1804  HGB 14.0  --   --  12.9*  --   --   --   HCT 39.5*  --   --  36.6*  --   --   --   PLT 270  --   --  257  --   --   --   APTT  --   --  42*  --   --   --   --   LABPROT  --  13.3  --   --   --   --   --   INR  --  1.01  --   --   --   --   --   HEPARINUNFRC  --   --   --  0.15*  --  0.39 0.32  CREATININE 0.73  --   --   --   --   --   --   TROPONINI <0.03  --  0.05*  --  0.06*  --   --     Estimated Creatinine Clearance: 129.9 mL/min (by C-G formula based on SCr of 0.8 mg/dL).   Medical History: Past Medical History:  Diagnosis Date  . Pulmonary emboli (HCC)   . Pulmonary embolism (HCC) 2011    Medications:  Scheduled:  . piperacillin-tazobactam (ZOSYN)  IV  3.375 g Intravenous Q8H  . simvastatin  40 mg Oral Daily  . sodium chloride flush  3 mL Intravenous Q12H  . tuberculin  5 Units Intradermal Once  . vancomycin  1,000 mg Intravenous Q8H    Assessment: 55 yo male who presents to the ED with chest pain and SOB. Not any any previous anticoagulants prior to admission.   Goal of Therapy:  Heparin level 0.3-0.7 units/ml Monitor platelets by anticoagulation protocol: Yes   Plan:  Heparin in range. Will continue at current rate of 1950 units/hr and obtain daily heparin level/CBC.   Pharmacy will continue to monitor and adjust per consult.   Delsa BernKelly m Fuhrmann 11/18/2015,6:44 PM

## 2015-11-18 NOTE — Consult Note (Signed)
Pulmonary Critical Care  Initial Consult Note   Frank MayerGary A Mcneff WUJ:[id number] DOB: 05/12/1960 DOA: 11/17/2015  Referring physician: Percell Locusima Vaikute, MD PCP: No PCP Per Patient   Chief Complaint: Shortness of Breath  HPI: Frank Rivera is a 55 y.o. male with prior history of PE in 2001 HLD recurring boils presented to the ED with SOB and chest pain. Apparently patient was diagnosed with PE back in 2001 and was treated with anticoagulation. He has been doing well with only routine office visits the last of which was in 2016 for PCP and also was seen in early August for a fracture of his left fibula. He states that he has been having increased shortness of breath noted and also has had pleuritic chest pain. On presentation to the ED he had a CT scan which shows presence of PE and also now he has RUL cavitary lesion of concern for a necrotic pneumonia or possibly neoplasm. Patient has a LE DVT noted also on US doppler. Patient has been having fevers noted. He has had no hemoptysis noted. He has chest pain as already indicated. There is a history of smoking. He has no travel history he states he mainly stays at home and works out of his house. No foreign or domestic travel.   Review of Systems:  Constitutional:  No weight loss, night sweats, +Fevers, +chills, +fatigue.  HEENT:  No headaches, nasal congestion, post nasal drip,  Cardio-vascular:  +chest pain,no Orthopnea, PND, swelling in lower extremities  GI:  No heartburn, indigestion, abdominal pain, nausea, vomiting, diarrhea  Resp:  +shortness of breath with exertion and at rest. No coughing up of blood.No wheezing Skin:  no rash or lesions.  Musculoskeletal:  No joint pain or swelling.   Remainder ROS performed and is unremarkable other than noted in HPI  Past Medical History:  Diagnosis Date  . Pulmonary emboli (HCC)   . Pulmonary embolism (HCC) 2011   History reviewed. No pertinent surgical history. Social History:  reports that he has  been smoking Cigarettes.  He has never used smokeless tobacco. He reports that he does not drink alcohol. His drug history is not on file.  Allergies  Allergen Reactions  . Sulfa Antibiotics Other (See Comments)    Family History  Problem Relation Age of Onset  . Diabetes Neg Hx     Prior to Admission medications   Medication Sig Start Date End Date Taking? Authorizing Provider  ibuprofen (ADVIL,MOTRIN) 200 MG tablet Take 200 mg by mouth every 6 (six) hours as needed.   Yes Historical Provider, MD  simvastatin (ZOCOR) 40 MG tablet Take 40 mg by mouth daily.   Yes Historical Provider, MD  oxyCODONE-acetaminophen (PERCOCET) 5-325 MG tablet Take 1-2 tablets by mouth every 4 (four) hours as needed for severe pain. Patient not taking: Reported on 11/17/2015 11/01/15   Tommi Rumpshonda L Summers, PA-C   Physical Exam: Vitals:   11/17/15 2000 11/17/15 2049 11/18/15 0435 11/18/15 0920  BP: (!) 144/76 (!) 174/95 130/76 140/77  Pulse: (!) 111 (!) 116 (!) 105 (!) 112  Resp: (!) 32 (!) 40 20 (!) 22  Temp:  99.7 F (37.6 C) 99.7 F (37.6 C)   TempSrc:  Oral Oral   SpO2: 92% 100% 94% 93%  Weight:      Height:        Wt Readings from Last 3 Encounters:  11/17/15 108.9 kg (240 lb)  11/01/15 108.9 kg (240 lb)    General:  Appears calm and  comfortable Eyes: PERRL, normal lids, irises & conjunctiva ENT: grossly normal hearing, lips & tongue Neck: no LAD, masses or thyromegaly Cardiovascular: RRR, no m/r/g. No LE edema. Respiratory: CTA bilaterally, no w/r/r. shallow respiratory effort. Abdomen: soft, nontender Skin: no rash or induration seen on limited exam Musculoskeletal: grossly normal tone BUE/BLE Psychiatric: grossly normal mood and affect Neurologic: grossly non-focal.          Labs on Admission:  Basic Metabolic Panel:  Recent Labs Lab 11/17/15 1657  NA 140  K 3.5  CL 103  CO2 27  GLUCOSE 122*  BUN 13  CREATININE 0.73  CALCIUM 9.3   Liver Function Tests: No results for  input(s): AST, ALT, ALKPHOS, BILITOT, PROT, ALBUMIN in the last 168 hours. No results for input(s): LIPASE, AMYLASE in the last 168 hours. No results for input(s): AMMONIA in the last 168 hours. CBC:  Recent Labs Lab 11/17/15 1657 11/18/15 0325  WBC 8.6 11.6*  HGB 14.0 12.9*  HCT 39.5* 36.6*  MCV 94.4 94.2  PLT 270 257   Cardiac Enzymes:  Recent Labs Lab 11/17/15 1657 11/18/15 0149 11/18/15 0732  TROPONINI <0.03 0.05* 0.06*    BNP (last 3 results)  Recent Labs  11/17/15 1657  BNP 27.0    ProBNP (last 3 results) No results for input(s): PROBNP in the last 8760 hours.  CBG: No results for input(s): GLUCAP in the last 168 hours.  Radiological Exams on Admission: Dg Chest 2 View  Result Date: 11/17/2015 CLINICAL DATA:  Worsening shortness of breath and left-sided chest pain. EXAM: CHEST  2 VIEW COMPARISON:  None. FINDINGS: Cardiomediastinal silhouette is normal. Mediastinal contours appear intact. There is no evidence of pneumothorax. Ill-defined focal asymmetry seen in the right lung apex. Linear opacities are also noted in the left mid lung field. Osseous structures are without acute abnormality. Soft tissues are grossly normal. IMPRESSION: Ill-defined opacity in the right lung apex, which may represent pleural parenchymal scarring, focal consolidation, loculated pleural effusion or pulmonary mass. Linear opacities in the left midlung may represent scarring versus atelectasis. If patient presents with findings suggestive of acute pneumonia, follow-up after empiric treatment is recommended. Electronically Signed   By: Ted Mcalpine M.D.   On: 11/17/2015 17:52   Ct Angio Chest Pe W And/or Wo Contrast  Result Date: 11/17/2015 CLINICAL DATA:  Chest pain starting 3 hours ago. EXAM: CT ANGIOGRAPHY CHEST WITH CONTRAST TECHNIQUE: Multidetector CT imaging of the chest was performed using the standard protocol during bolus administration of intravenous contrast. Multiplanar CT  image reconstructions and MIPs were obtained to evaluate the vascular anatomy. CONTRAST:  75 cc Isovue 370 intravenously. COMPARISON:  Chest radiograph 11/17/2015 FINDINGS: Mediastinum/Lymph Nodes: There are bilateral central pulmonary emboli with heavy clot burden. A near occlusive large pulmonary embolus is seen in the left main pulmonary artery extending to all lobar branches with occlusive pulmonary emboli within the left lower and lingular lobar and segmental branches. There are slightly more peripheral nonocclusive pulmonary emboli on the right involving the takeoff of all lobar branches. There is near occlusive pulmonary embolus within a segmental branch in the right lower lobe. No evidence of right heart strain. Lungs/Pleura: There is a cavitary subpleural mass in the lateral aspect of the right upper lobe measuring 4.2 x 2.2 x 2.1 cm. Peripheral pleural based patchy areas of airspace consolidation are seen in the left lower lobe and lingula, and in the lateral aspect of the left upper lobe. Upper abdomen: No acute findings. Musculoskeletal: No chest  wall mass or suspicious bone lesions identified. Review of the MIP images confirms the above findings. IMPRESSION: Bilateral pulmonary emboli with having clot burden. No evidence of right heart strain. Cavitary subpleural mass in the lateral aspect of the right upper lobe which may represent area of lung necrosis due to hypoperfusion, lung abscess or cavitary primary lung malignancy. Areas of patchy airspace consolidation in the periphery of the left lung may represent sequela of hypoperfusion. Critical Value/emergent results were called by telephone at the time of interpretation on 11/17/2015 at 7:02 pm to Dr. Willy EddyPATRICK ROBINSON , who verbally acknowledged these results. Electronically Signed   By: Ted Mcalpineobrinka  Dimitrova M.D.   On: 11/17/2015 19:06       EKG: Independently reviewed.  Assessment/Plan Active Problems:   Acute pulmonary embolism  (HCC)   1. RUL Cavitary Lesion ?Mass vs Infectious process -I have no older to films to look and compare -at this time in the setting of fevers and elevated WBC I would treat as an infectious process however he will need a follow up in the office and imaging studies to demonstrate there is improvement or not -agree with current abx -I would emphasize the importance of this as there is a strong history of smoking -if there is no importance on follow up then he needs to have a tissue biopsy -agree with trying to obtain cultures -would also get a quantiferon  2. Pulmonary Embolism -related to trauma likely -continue with anticoagulation heparin at this time -would switch to oral therapy as planned  3. DVT -as above continue with anticoagulation  Code Status: full code DVT Prophylaxis:on heparin Family Communication: none Disposition Plan: .TBD  Time spent: 70min     I have personally obtained a history, examined the patient, evaluated laboratory and imaging results, formulated the assessment and plan and placed orders.  The Patient requires high complexity decision making for assessment and support.    Yevonne PaxSaadat A Nakia Koble, MD [facility]FCCP Pulmonary Critical Care Medicine Sleep Medicine

## 2015-11-18 NOTE — Care Management (Addendum)
Patient admitted with multiple bilateral pulmonary  embolus.  Patient has had a previous history of same.  patient states that he is going to be discharged home today.  Has pharmacy coverage on his insurance plan.  Patient fractured ankle about 2 weeks ago and says he was medically restricted non weight bearing and in a wheelchair the last 2 weeks.  Says he just received his boot to use with ambulation.  Presently he is on 02 and heparin drip.  There are concerns there may be a cavity/mass on lung.  he is being moved to a negative pressure room.  This CM will keep this patient on caseload.  Discussed during progression to anticipate the need to assess patient for home 02 at discharge.  At present he appears short of breath with conversation. Prior to this presentation, Independent in all adls, denies issues accessing medical care, obtaining medications or with transportation.  Pulmonary consult pending

## 2015-11-18 NOTE — Progress Notes (Signed)
Pharmacy Antibiotic Note  Gatha MayerGary A Dunlop is a 55 y.o. male admitted on 11/17/2015 with pneumonia.  Pharmacy has been consulted for Zosyn and Vancomycin  dosing.  Plan: T1/2: 6.7  Ke: 0.104  Vd: 60  Plan for Vancomcyin 1gm IV every 8 hours. Calculated trough at Css 14, but expect accumulation. Plan for trough prior to 5th dose.  Will start Zosyn 3.375 IV every 8 hours.   MRSA PCR negative as of  1559 on 8/23- Recommend DC of vancomycin .   Height: 5\' 9"  (175.3 cm) Weight: 240 lb (108.9 kg) IBW/kg (Calculated) : 70.7  Temp (24hrs), Avg:99.7 F (37.6 C), Min:99.7 F (37.6 C), Max:99.7 F (37.6 C)   Recent Labs Lab 11/17/15 1657 11/18/15 0325 11/18/15 1804  WBC 8.6 11.6*  --   CREATININE 0.73  --  0.78    Estimated Creatinine Clearance: 129.9 mL/min (by C-G formula based on SCr of 0.8 mg/dL).    Allergies  Allergen Reactions  . Sulfa Antibiotics Other (See Comments)    Antimicrobials this admission: 8/23 Vancomycin >>  8/23 Zosyn >>    Microbiology results: 8/23 MRSA PCR: negative   Thank you for allowing pharmacy to be a part of this patient's care.  Cher NakaiSheema Alayza Pieper, PharmD Clinical Pharmacist 11/18/2015 7:03 PM

## 2015-11-18 NOTE — Progress Notes (Signed)
Pt states he got really anxious because he could not get cool and was feeling really hot, accidentally pulled IV out, pt thinks he sweated fever out and believes that is why his heart rate got so high. Pt states he does not think he needs an EKG. Pt's heart rate is now back to 110/sinus tach, fever is down to 98.9, pt feels cooler and new IV started. Pt states he feels much better. MD paged again to see if he wants to continue with EKG. Dr. Anne HahnWillis states as long as pt feels better and is asymptomatic, then okay to cancel EKG, which Dr. Anne HahnWillis did cancel. But will continue to monitor pt. Shirley FriarAlexis Miller, RN

## 2015-11-18 NOTE — Progress Notes (Signed)
ANTICOAGULATION CONSULT NOTE - Initial Consult  Pharmacy Consult for heparin  Indication: pulmonary embolus  Allergies  Allergen Reactions  . Sulfa Antibiotics Other (See Comments)    Patient Measurements: Height: 5\' 9"  (175.3 cm) Weight: 240 lb (108.9 kg) IBW/kg (Calculated) : 70.7 Heparin Dosing Weight: 94.5 kg  Vital Signs: Temp: 99.7 F (37.6 C) (08/23 0435) Temp Source: Oral (08/23 0435) BP: 130/76 (08/23 0435) Pulse Rate: 105 (08/23 0435)  Labs:  Recent Labs  11/17/15 1657 11/17/15 1736 11/18/15 0149 11/18/15 0325  HGB 14.0  --   --  12.9*  HCT 39.5*  --   --  36.6*  PLT 270  --   --  257  APTT  --   --  42*  --   LABPROT  --  13.3  --   --   INR  --  1.01  --   --   HEPARINUNFRC  --   --   --  0.15*  CREATININE 0.73  --   --   --   TROPONINI <0.03  --  0.05*  --     Estimated Creatinine Clearance: 129.9 mL/min (by C-G formula based on SCr of 0.8 mg/dL).   Medical History: Past Medical History:  Diagnosis Date  . Pulmonary emboli (HCC)   . Pulmonary embolism (HCC) 2011    Medications:  Scheduled:  . simvastatin  40 mg Oral Daily  . sodium chloride flush  3 mL Intravenous Q12H    Assessment: 55 yo male who presents to the ED with chest pain and SOB. Not any any previous anticoagulants prior to admission.   Goal of Therapy:  Heparin level 0.3-0.7 units/ml Monitor platelets by anticoagulation protocol: Yes   Plan:  Give 5000 units bolus x 1 Start heparin infusion at 1600 units/hr Check anti-Xa level in 6 hours and daily while on heparin Continue to monitor H&H and platelets   8/23 03:30 heparin level 0.15. 2900 unit bolus and increase rate to 1950 units/hr. Recheck in 6 hours.  Deran Barro S 11/18/2015,5:11 AM

## 2015-11-18 NOTE — Progress Notes (Signed)
ANTICOAGULATION CONSULT NOTE  Pharmacy Consult for heparin  Indication: pulmonary embolus  Allergies  Allergen Reactions  . Sulfa Antibiotics Other (See Comments)    Patient Measurements: Height: 5\' 9"  (175.3 cm) Weight: 240 lb (108.9 kg) IBW/kg (Calculated) : 70.7 Heparin Dosing Weight: 94.5 kg  Vital Signs: Temp: 99.7 F (37.6 C) (08/23 0435) Temp Source: Oral (08/23 0435) BP: 140/77 (08/23 0920) Pulse Rate: 112 (08/23 0920)  Labs:  Recent Labs  11/17/15 1657 11/17/15 1736 11/18/15 0149 11/18/15 0325 11/18/15 0732 11/18/15 1103  HGB 14.0  --   --  12.9*  --   --   HCT 39.5*  --   --  36.6*  --   --   PLT 270  --   --  257  --   --   APTT  --   --  42*  --   --   --   LABPROT  --  13.3  --   --   --   --   INR  --  1.01  --   --   --   --   HEPARINUNFRC  --   --   --  0.15*  --  0.39  CREATININE 0.73  --   --   --   --   --   TROPONINI <0.03  --  0.05*  --  0.06*  --     Estimated Creatinine Clearance: 129.9 mL/min (by C-G formula based on SCr of 0.8 mg/dL).   Medical History: Past Medical History:  Diagnosis Date  . Pulmonary emboli (HCC)   . Pulmonary embolism (HCC) 2011    Medications:  Scheduled:  . piperacillin-tazobactam (ZOSYN)  IV  3.375 g Intravenous Q8H  . simvastatin  40 mg Oral Daily  . sodium chloride flush  3 mL Intravenous Q12H  . tuberculin  5 Units Intradermal Once  . vancomycin  1,000 mg Intravenous Q8H    Assessment: 55 yo male who presents to the ED with chest pain and SOB. Not any any previous anticoagulants prior to admission.   Goal of Therapy:  Heparin level 0.3-0.7 units/ml Monitor platelets by anticoagulation protocol: Yes   Plan:  Heparin in range. Will continue at current rate of 1950 units/hr and obtain confirmatory level at 1800.   Pharmacy will continue to monitor and adjust per consult.   Simpson,Michael L 11/18/2015,4:34 PM

## 2015-11-19 LAB — RHEUMATOID FACTOR: Rhuematoid fact SerPl-aCnc: 12.4 IU/mL (ref 0.0–13.9)

## 2015-11-19 LAB — VANCOMYCIN, TROUGH: VANCOMYCIN TR: 9 ug/mL — AB (ref 15–20)

## 2015-11-19 LAB — CBC
HCT: 37 % — ABNORMAL LOW (ref 40.0–52.0)
Hemoglobin: 12.9 g/dL — ABNORMAL LOW (ref 13.0–18.0)
MCH: 32.9 pg (ref 26.0–34.0)
MCHC: 34.9 g/dL (ref 32.0–36.0)
MCV: 94 fL (ref 80.0–100.0)
PLATELETS: 262 10*3/uL (ref 150–440)
RBC: 3.93 MIL/uL — ABNORMAL LOW (ref 4.40–5.90)
RDW: 12.1 % (ref 11.5–14.5)
WBC: 17.1 10*3/uL — ABNORMAL HIGH (ref 3.8–10.6)

## 2015-11-19 LAB — ANA COMPREHENSIVE PANEL
ENA SM Ab Ser-aCnc: <0.2 AI (ref 0.0–0.9)
Ribonucleic Protein: <0.2 AI (ref 0.0–0.9)
SSA (Ro) (ENA) Antibody, IgG: <0.2 AI (ref 0.0–0.9)
Scleroderma (Scl-70) (ENA) Antibody, IgG: <0.2 AI (ref 0.0–0.9)

## 2015-11-19 LAB — EXPECTORATED SPUTUM ASSESSMENT W REFEX TO RESP CULTURE: SPECIAL REQUESTS: NORMAL

## 2015-11-19 LAB — HEPARIN LEVEL (UNFRACTIONATED)
HEPARIN UNFRACTIONATED: 0.24 [IU]/mL — AB (ref 0.30–0.70)
HEPARIN UNFRACTIONATED: 0.3 [IU]/mL (ref 0.30–0.70)
Heparin Unfractionated: 0.14 IU/mL — ABNORMAL LOW (ref 0.30–0.70)

## 2015-11-19 LAB — ANCA TITERS: Atypical P-ANCA titer: <1:20 {titer}

## 2015-11-19 LAB — EXPECTORATED SPUTUM ASSESSMENT W GRAM STAIN, RFLX TO RESP C

## 2015-11-19 MED ORDER — ZOLPIDEM TARTRATE 5 MG PO TABS
5.0000 mg | ORAL_TABLET | Freq: Every evening | ORAL | Status: DC | PRN
  Administered 2015-11-19 – 2015-11-22 (×4): 5 mg via ORAL
  Filled 2015-11-19 (×4): qty 1

## 2015-11-19 MED ORDER — HEPARIN BOLUS VIA INFUSION
1500.0000 [IU] | Freq: Once | INTRAVENOUS | Status: AC
  Administered 2015-11-19: 1500 [IU] via INTRAVENOUS
  Filled 2015-11-19: qty 1500

## 2015-11-19 MED ORDER — VANCOMYCIN HCL IN DEXTROSE 1-5 GM/200ML-% IV SOLN
1000.0000 mg | Freq: Three times a day (TID) | INTRAVENOUS | Status: DC
  Filled 2015-11-19 (×2): qty 200

## 2015-11-19 MED ORDER — VANCOMYCIN HCL 10 G IV SOLR
1250.0000 mg | Freq: Three times a day (TID) | INTRAVENOUS | Status: DC
  Administered 2015-11-20 (×2): 1250 mg via INTRAVENOUS
  Filled 2015-11-19 (×6): qty 1250

## 2015-11-19 MED ORDER — VANCOMYCIN HCL IN DEXTROSE 1-5 GM/200ML-% IV SOLN
1000.0000 mg | Freq: Three times a day (TID) | INTRAVENOUS | Status: DC
Start: 2015-11-19 — End: 2015-11-19
  Administered 2015-11-19: 1000 mg via INTRAVENOUS
  Filled 2015-11-19 (×3): qty 200

## 2015-11-19 MED ORDER — HEPARIN NICU/PEDS BOLUS VIA INFUSION: 3000.0000 [IU] | Freq: Once | INTRAVENOUS | Status: DC

## 2015-11-19 MED ORDER — HEPARIN BOLUS VIA INFUSION
3000.0000 [IU] | Freq: Once | INTRAVENOUS | Status: AC
  Administered 2015-11-19: 3000 [IU] via INTRAVENOUS
  Filled 2015-11-19: qty 3000

## 2015-11-19 NOTE — Progress Notes (Signed)
Pharmacy Antibiotic Note  Frank Rivera is a 55 y.o. male admitted on 11/17/2015 with pneumonia.  Pharmacy has been consulted for vancomycin/Zosyn dosing. Patient admitted with PE.   Plan: Vancomycin 1g IV q8hr for goal trough of 15-20. Obtain trough prior 1700 dose on 8/24. MRSA screen negative; ID consult pending.   Zosyn EI 3.375g IV Q8hr.   Height: 5\' 9"  (175.3 cm) Weight: 240 lb (108.9 kg) IBW/kg (Calculated) : 70.7  Temp (24hrs), Avg:100 F (37.8 C), Min:98.8 F (37.1 C), Max:101.9 F (38.8 C)   Recent Labs Lab 11/17/15 1657 11/18/15 0325 11/18/15 1804 11/19/15 0540  WBC 8.6 11.6*  --  17.1*  CREATININE 0.73  --  0.78  --     Estimated Creatinine Clearance: 129.9 mL/min (by C-G formula based on SCr of 0.8 mg/dL).     Antimicrobials this admission: Vancomycin 8/24 >>  Zosyn 8/24 >>   Dose adjustments this admission: N/A  Microbiology results: 8/24 BCx: pending  8/24 Sputum: pending  8/23 MRSA PCR: negative   Pharmacy will continue to monitor and adjust per consult.    Juliano Mceachin L 11/19/2015 4:33 PM

## 2015-11-19 NOTE — Progress Notes (Signed)
ANTICOAGULATION CONSULT NOTE  Pharmacy Consult for heparin  Indication: pulmonary embolus  Allergies  Allergen Reactions  . Sulfa Antibiotics Other (See Comments)    Patient Measurements: Height: 5\' 9"  (175.3 cm) Weight: 240 lb (108.9 kg) IBW/kg (Calculated) : 70.7 Heparin Dosing Weight: 94.5 kg  Vital Signs: Temp: 99.3 F (37.4 C) (08/24 0544) Temp Source: Oral (08/24 0544) BP: 152/85 (08/24 0508) Pulse Rate: 116 (08/24 0508)  Labs:  Recent Labs  11/17/15 1657 11/17/15 1736 11/18/15 0149  11/18/15 0325 11/18/15 0732 11/18/15 1103 11/18/15 1804 11/19/15 0540  HGB 14.0  --   --   --  12.9*  --   --   --  12.9*  HCT 39.5*  --   --   --  36.6*  --   --   --  37.0*  PLT 270  --   --   --  257  --   --   --  262  APTT  --   --  42*  --   --   --   --   --   --   LABPROT  --  13.3  --   --   --   --   --   --   --   INR  --  1.01  --   --   --   --   --   --   --   HEPARINUNFRC  --   --   --   < > 0.15*  --  0.39 0.32 0.24*  CREATININE 0.73  --   --   --   --   --   --  0.78  --   TROPONINI <0.03  --  0.05*  --   --  0.06*  --   --   --   < > = values in this interval not displayed.  Estimated Creatinine Clearance: 129.9 mL/min (by C-G formula based on SCr of 0.8 mg/dL).   Medical History: Past Medical History:  Diagnosis Date  . Pulmonary emboli (HCC)   . Pulmonary embolism (HCC) 2011    Medications:  Scheduled:  . heparin  1,500 Units Intravenous Once  . piperacillin-tazobactam (ZOSYN)  IV  3.375 g Intravenous Q8H  . simvastatin  40 mg Oral Daily  . sodium chloride flush  3 mL Intravenous Q12H  . tuberculin  5 Units Intradermal Once  . vancomycin  1,000 mg Intravenous Q8H    Assessment: 55 yo male who presents to the ED with chest pain and SOB. Not any any previous anticoagulants prior to admission.   Goal of Therapy:  Heparin level 0.3-0.7 units/ml Monitor platelets by anticoagulation protocol: Yes   Plan:  Heparin in range. Will continue at  current rate of 1950 units/hr and obtain daily heparin level/CBC.   8/24 05:30 heparin level 0.24. 1500 unit bolus and increase rate to 2150 units/hr. Recheck in 6 hours.  Pharmacy will continue to monitor and adjust per consult.   Miasha Emmons S 11/19/2015,7:01 AM

## 2015-11-19 NOTE — Progress Notes (Signed)
Pharmacy Antibiotic Note  Frank Rivera is a 55 y.o. male admitted on 11/17/2015 with pneumonia.  Pharmacy has been consulted for vancomycin/Zosyn dosing. Patient admitted with PE.   Plan: Vancomycin trough 9, will transition patient to vancomycin 1250mg  IV q8hr for goal trough of 15-20. MRSA screen negative; ID consult pending.   Zosyn EI 3.375g IV Q8hr.   Height: 5\' 9"  (175.3 cm) Weight: 240 lb (108.9 kg) IBW/kg (Calculated) : 70.7  Temp (24hrs), Avg:99.8 F (37.7 C), Min:98.7 F (37.1 C), Max:101.9 F (38.8 C)   Recent Labs Lab 11/17/15 1657 11/18/15 0325 11/18/15 1804 11/19/15 0540 11/19/15 1634  WBC 8.6 11.6*  --  17.1*  --   CREATININE 0.73  --  0.78  --   --   VANCOTROUGH  --   --   --   --  9*    Estimated Creatinine Clearance: 129.9 mL/min (by C-G formula based on SCr of 0.8 mg/dL).     Antimicrobials this admission: Vancomycin 8/24 >>  Zosyn 8/24 >>   Dose adjustments this admission: 8/24 vancomycin transitioned from 1000mg  Q8hr to 1250mg  Q8hr.  Microbiology results: 8/24 BCx: pending  8/24 Sputum: pending  8/23 MRSA PCR: negative   Pharmacy will continue to monitor and adjust per consult.    Simpson,Michael L 11/19/2015 9:05 PM

## 2015-11-19 NOTE — Progress Notes (Signed)
ANTICOAGULATION CONSULT NOTE  Pharmacy Consult for heparin  Indication: pulmonary embolus  Allergies  Allergen Reactions  . Sulfa Antibiotics Other (See Comments)    Patient Measurements: Height: 5\' 9"  (175.3 cm) Weight: 240 lb (108.9 kg) IBW/kg (Calculated) : 70.7 Heparin Dosing Weight: 94.5 kg  Vital Signs: Temp: 98.8 F (37.1 C) (08/24 1049) Temp Source: Oral (08/24 1049) BP: 143/74 (08/24 1049) Pulse Rate: 111 (08/24 1049)  Labs:  Recent Labs  11/17/15 1657 11/17/15 1736 11/18/15 0149 11/18/15 0325 11/18/15 0732  11/18/15 1804 11/19/15 0540 11/19/15 1332  HGB 14.0  --   --  12.9*  --   --   --  12.9*  --   HCT 39.5*  --   --  36.6*  --   --   --  37.0*  --   PLT 270  --   --  257  --   --   --  262  --   APTT  --   --  42*  --   --   --   --   --   --   LABPROT  --  13.3  --   --   --   --   --   --   --   INR  --  1.01  --   --   --   --   --   --   --   HEPARINUNFRC  --   --   --  0.15*  --   < > 0.32 0.24* 0.14*  CREATININE 0.73  --   --   --   --   --  0.78  --   --   TROPONINI <0.03  --  0.05*  --  0.06*  --   --   --   --   < > = values in this interval not displayed.  Estimated Creatinine Clearance: 129.9 mL/min (by C-G formula based on SCr of 0.8 mg/dL).   Medical History: Past Medical History:  Diagnosis Date  . Pulmonary emboli (HCC)   . Pulmonary embolism (HCC) 2011    Medications:  Scheduled:  . heparin  3,000 Units Intravenous Once  . piperacillin-tazobactam (ZOSYN)  IV  3.375 g Intravenous Q8H  . simvastatin  40 mg Oral Daily  . sodium chloride flush  3 mL Intravenous Q12H  . tuberculin  5 Units Intradermal Once  . vancomycin  1,000 mg Intravenous Q8H    Assessment: Pharmacy consulted for heparin drip management for 55 yo male admitted with PE.   Goal of Therapy:  Heparin level 0.3-0.7 units/ml Monitor platelets by anticoagulation protocol: Yes   Plan:  Will bolus heparin 3000 units x 1 and increase rate to 2500units/hr. Will  obtain follow-up Anti-Xa level at 2030.    Pharmacy will continue to monitor and adjust per consult.   Simpson,Michael L 11/19/2015,4:31 PM

## 2015-11-19 NOTE — Consult Note (Signed)
[facility] Vascular Consult Note  MRN : [id number]  Frank Rivera is a 55 y.o. ([birth year]) male who presents with chief complaint of  Chief Complaint  Patient presents with  . Chest Pain  . Shortness of Breath   History of Present Illness:  Frank Rivera  is a 55 year old male with a known history of prior provoked pulmonary embolism back in 2001 who is presenting to [facility] complaining of  chest pain and shortness off breath. The patient endorses a recent history of an ankle fracture a few weeks ago. He states he has been essentially immobile since mainly using a wheelchair.   He presented to [facility] ED on 11/17/15 complaining of 2-3 days of chest pain and  shortness of breath. Chest pain originally right sided, pleuritic in nature worse with movement and describes it sharp in quality. Associated shortness of breath mainly with activity.   Workup notable for CT scan of the chest which revealed cavitation lesion in the right upper lobe and pulmonary emboli. Left lower extremity venous duplex Extensive left calf and femoral-popliteal DVT as above  Patient denies any left lower extremity pain. States his breathing has improved since his admission.  Vascular surgery consulted by Dr. Elisabeth Pigeon for possible IVC filter placement.   Current Facility-Administered Medications  Medication Dose Route Frequency Provider Last Rate Last Dose  . acetaminophen (TYLENOL) tablet 650 mg  650 mg Oral Q6H PRN Wyatt Haste, MD   650 mg at 11/18/15 2131   Or  . acetaminophen (TYLENOL) suppository 650 mg  650 mg Rectal Q6H PRN Wyatt Haste, MD      . fentaNYL (SUBLIMAZE) injection 100 mcg  100 mcg Intravenous Q1H PRN Willy Eddy, MD   100 mcg at 11/17/15 2005  . heparin ADULT infusion 100 units/mL (25000 units/222mL sodium chloride 0.45%)  2,150 Units/hr Intravenous Continuous Wyatt Haste, MD 19.5 mL/hr at 11/18/15 1708 1,950 Units/hr at 11/18/15 1708  . heparin bolus via infusion 1,500  Units  1,500 Units Intravenous Once Wyatt Haste, MD      . ondansetron [facility]) tablet 4 mg  4 mg Oral Q6H PRN Wyatt Haste, MD       Or  . ondansetron [facility]) injection 4 mg  4 mg Intravenous Q6H PRN Wyatt Haste, MD      . oxyCODONE (Oxy IR/ROXICODONE) immediate release tablet 5 mg  5 mg Oral Q4H PRN Wyatt Haste, MD   5 mg at 11/19/15 0725  . piperacillin-tazobactam (ZOSYN) IVPB 3.375 g  3.375 g Intravenous Q8H Katharina Caper, MD   3.375 g at 11/19/15 0541  . simvastatin (ZOCOR) tablet 40 mg  40 mg Oral Daily Wyatt Haste, MD   40 mg at 11/18/15 7829  . sodium chloride flush (NS) 0.9 % injection 3 mL  3 mL Intravenous Q12H Wyatt Haste, MD   3 mL at 11/18/15 2135  . tuberculin injection 5 Units  5 Units Intradermal Once Katharina Caper, MD   5 Units at 11/18/15 1220  . vancomycin (VANCOCIN) IVPB 1000 mg/200 mL premix  1,000 mg Intravenous Q8H Katharina Caper, MD   1,000 mg at 11/19/15 0158    Past Medical History:  Diagnosis Date  . Pulmonary emboli (HCC)   . Pulmonary embolism (HCC) 2011    History reviewed. No pertinent surgical history.  Social History Social History  Substance Use Topics  . Smoking status: Current Every Day Smoker    Types: Cigarettes  .  Smokeless tobacco: Never Used  . Alcohol use No    Family History Family History  Problem Relation Age of Onset  . Diabetes Neg Hx   Denies family history of clotting / bleeding disorder, PAD or renal disease.   Allergies  Allergen Reactions  . Sulfa Antibiotics Other (See Comments)   REVIEW OF SYSTEMS (Negative unless checked)  Constitutional: [] Weight loss  [] Fever  [] Chills Cardiac: [x] Chest pain   [x] Chest pressure   [] Palpitations   [] Shortness of breath when laying flat   [] Shortness of breath at rest   [x] Shortness of breath with exertion. Vascular:  [] Pain in legs with walking   [] Pain in legs at rest   [] Pain in legs when laying flat   [] Claudication   [] Pain in feet when walking  [] Pain in feet at rest   [] Pain in feet when laying flat   [x] History of DVT   [] Phlebitis   [x] Swelling in legs   [] Varicose veins   [] Non-healing ulcers Pulmonary:   [] Uses home oxygen   [] Productive cough   [] Hemoptysis   [] Wheeze  [] COPD   [] Asthma Neurologic:  [] Dizziness  [] Blackouts   [] Seizures   [] History of stroke   [] History of TIA  [] Aphasia   [] Temporary blindness   [] Dysphagia   [] Weakness or numbness in arms   [] Weakness or numbness in legs Musculoskeletal:  [] Arthritis   [] Joint swelling   [] Joint pain   [] Low back pain Hematologic:  [] Easy bruising  [] Easy bleeding   [] Hypercoagulable state   [] Anemic  [] Hepatitis Gastrointestinal:  [] Blood in stool   [] Vomiting blood  [] Gastroesophageal reflux/heartburn   [] Difficulty swallowing. Genitourinary:  [] Chronic kidney disease   [] Difficult urination  [] Frequent urination  [] Burning with urination   [] Blood in urine Skin:  [] Rashes   [] Ulcers   [] Wounds Psychological:  [] History of anxiety   []  History of major depression.  Physical Examination  Vitals:   11/18/15 2248 11/19/15 0508 11/19/15 0544 11/19/15 0800  BP:  (!) 152/85  (!) 165/77  Pulse:  (!) 116  (!) 128  Resp:  20  (!) 26  Temp: 98.9 F (37.2 C) (!) 101.9 F (38.8 C) 99.3 F (37.4 C) (!) 100.6 F (38.1 C)  TempSrc: Oral Oral Oral Oral  SpO2:  96%  94%  Weight:      Height:       Body mass index is 35.44 kg/m. Gen:  WD/WN, NAD Head: [place]/AT, No temporalis wasting. Prominent temp pulse not noted. Ear/Nose/Throat: Hearing grossly intact, nares w/o erythema or drainage, oropharynx w/o Erythema/Exudate Eyes: PERRLA, EOMI.  Neck: Supple, no nuchal rigidity.  No bruit or JVD.  Pulmonary:  Good air movement, clear to auscultation bilaterally.  Cardiac: RRR, normal S1, S2, no Murmurs, rubs or gallops. Vascular:  Vessel Right Left  Radial Palpable Palpable  Ulnar Palpable Palpable  Brachial Palpable Palpable  Carotid Palpable, without bruit Palpable, without bruit  Aorta Not palpable N/A   Femoral Palpable Palpable  Popliteal Palpable Palpable  PT Palpable Palpable  DP Palpable Palpable   Left Lower Extremity: Minimal swelling when compared to right lower extremity. Minimally tender to palpation. Minimal pain with dorsiflexion. No acute compromise to extremity.   Gastrointestinal: soft, non-tender/non-distended. No guarding/reflex. No masses, surgical incisions, or scars. Musculoskeletal: M/S 5/5 throughout.  Extremities without ischemic changes.  No deformity or atrophy. No edema. Neurologic: CN 2-12 intact. Pain and light touch intact in extremities.  Symmetrical.  Speech is fluent. Motor exam as listed above. Psychiatric: Judgment intact, Mood &  affect appropriate for pt's clinical situation. Dermatologic: No rashes or ulcers noted.  No cellulitis or open wounds. Lymph : No Cervical, Axillary, or Inguinal lymphadenopathy.  CBC Lab Results  Component Value Date   WBC 17.1 (H) 11/19/2015   HGB 12.9 (L) 11/19/2015   HCT 37.0 (L) 11/19/2015   MCV 94.0 11/19/2015   PLT 262 11/19/2015    BMET    Component Value Date/Time   NA 132 (L) 11/18/2015 1804   K 4.2 11/18/2015 1804   CL 100 (L) 11/18/2015 1804   CO2 23 11/18/2015 1804   GLUCOSE 107 (H) 11/18/2015 1804   BUN 11 11/18/2015 1804   CREATININE 0.78 11/18/2015 1804   CALCIUM 9.0 11/18/2015 1804   GFRNONAA >60 11/18/2015 1804   GFRAA >60 11/18/2015 1804   Estimated Creatinine Clearance: 129.9 mL/min (by C-G formula based on SCr of 0.8 mg/dL).  COAG Lab Results  Component Value Date   INR 1.01 11/17/2015    Radiology Dg Chest 2 View  Result Date: 11/17/2015 CLINICAL DATA:  Worsening shortness of breath and left-sided chest pain. EXAM: CHEST  2 VIEW COMPARISON:  None. FINDINGS: Cardiomediastinal silhouette is normal. Mediastinal contours appear intact. There is no evidence of pneumothorax. Ill-defined focal asymmetry seen in the right lung apex. Linear opacities are also noted in the left mid lung field.  Osseous structures are without acute abnormality. Soft tissues are grossly normal. IMPRESSION: Ill-defined opacity in the right lung apex, which may represent pleural parenchymal scarring, focal consolidation, loculated pleural effusion or pulmonary mass. Linear opacities in the left midlung may represent scarring versus atelectasis. If patient presents with findings suggestive of acute pneumonia, follow-up after empiric treatment is recommended. Electronically Signed   By: Ted Mcalpine M.D.   On: 11/17/2015 17:52   Dg Ankle Complete Left  Result Date: 11/01/2015 CLINICAL DATA:  Fall in the garage today. Pain and swelling located laterally. Initial encounter. EXAM: LEFT ANKLE COMPLETE - 3+ VIEW COMPARISON:  None FINDINGS: There is mild soft tissue swelling about the lateral aspect of the ankle. There is a nondisplaced transverse fracture through the lateral malleoli [name]. The distal tibia appears intact. There is no dislocation. Spurring is noted at the medial malleoli S and at the posterior and plantar aspects of the calcaneus. Bone mineralization appears normal. IMPRESSION: Nondisplaced lateral malleolus fracture. Electronically Signed   By: Sebastian Ache M.D.   On: 11/01/2015 17:00   Ct Angio Chest Pe W And/or Wo Contrast  Result Date: 11/17/2015 CLINICAL DATA:  Chest pain starting 3 hours ago. EXAM: CT ANGIOGRAPHY CHEST WITH CONTRAST TECHNIQUE: Multidetector CT imaging of the chest was performed using the standard protocol during bolus administration of intravenous contrast. Multiplanar CT image reconstructions and MIPs were obtained to evaluate the vascular anatomy. CONTRAST:  75 cc Isovue 370 intravenously. COMPARISON:  Chest radiograph 11/17/2015 FINDINGS: Mediastinum/Lymph Nodes: There are bilateral central pulmonary emboli with heavy clot burden. A near occlusive large pulmonary embolus is seen in the left main pulmonary artery extending to all lobar branches with occlusive pulmonary emboli  within the left lower and lingular lobar and segmental branches. There are slightly more peripheral nonocclusive pulmonary emboli on the right involving the takeoff of all lobar branches. There is near occlusive pulmonary embolus within a segmental branch in the right lower lobe. No evidence of right heart strain. Lungs/Pleura: There is a cavitary subpleural mass in the lateral aspect of the right upper lobe measuring 4.2 x 2.2 x 2.1 cm. Peripheral pleural based patchy  areas of airspace consolidation are seen in the left lower lobe and lingula, and in the lateral aspect of the left upper lobe. Upper abdomen: No acute findings. Musculoskeletal: No chest wall mass or suspicious bone lesions identified. Review of the MIP images confirms the above findings. IMPRESSION: Bilateral pulmonary emboli with having clot burden. No evidence of right heart strain. Cavitary subpleural mass in the lateral aspect of the right upper lobe which may represent area of lung necrosis due to hypoperfusion, lung abscess or cavitary primary lung malignancy. Areas of patchy airspace consolidation in the periphery of the left lung may represent sequela of hypoperfusion. Critical Value/emergent results were called by telephone at the time of interpretation on 11/17/2015 at 7:02 pm to Dr. Willy Eddy , who verbally acknowledged these results. Electronically Signed   By: Ted Mcalpine M.D.   On: 11/17/2015 19:06   US Venous Img Lower Unilateral Left  Result Date: 11/18/2015 CLINICAL DATA:  Swelling x2 weeks post ankle fracture. EXAM: LEFT LOWER EXTREMITY VENOUS DOPPLER ULTRASOUND TECHNIQUE: Gray-scale sonography with compression, as well as color and duplex ultrasound, were performed to evaluate the deep venous system from the level of the common femoral vein through the popliteal and proximal calf veins. COMPARISON:  None FINDINGS: Occlusive thrombus in posterior tibial and peroneal veins extending through the popliteal vein, with  resultant incompressibility. No significant color Doppler signal is evident. There is incompletely occlusive noncompressible thrombus in the femoral and common femoral veins, with some persistent color signal noted on color Doppler. Survey views of the contralateral common femoral vein are unremarkable. IMPRESSION: 1. Extensive left calf and femoral-popliteal DVT as above. These results will be called to the ordering clinician or representative by the Radiologist Assistant, and communication documented in the PACS or zVision Dashboard. Electronically Signed   By: Corlis Leak M.D.   On: 11/18/2015 16:33   Assessment/Plan 55 year old male with no significant medical history aside from hyperlipidemia and tobacco use with left lower extremity DVT and PE - stable 1) DVT / PE: Patient was not on anticoagulation as an outpatient - he did not fail oral therapy. Recommend Eliquis when transitioned to PO anticoagulation. DVT with subsequent PE most likely from trauma and immobility of ankle fracture. Asymptomatic left lower extremity DVT with improved breathing this AM - no indication for IVC filter placement as patient is not in respiratory distress.  2) Tobacco Cessation: spent >10 minutes the absolute need to stop using tobacco and its detrimental effect on the body especially relating to his respiratory status and PAD.  3) Cavity workup as per primary team. 4) Discussed with Dr. Weldon Inches, PA-C  11/19/2015 8:30 AM

## 2015-11-19 NOTE — Progress Notes (Signed)
Sputum induction with half normal saline,productive cough,sample sent to lab

## 2015-11-19 NOTE — Progress Notes (Addendum)
ANTICOAGULATION CONSULT NOTE  Pharmacy Consult for heparin  Indication: pulmonary embolus  Allergies  Allergen Reactions  . Sulfa Antibiotics Other (See Comments)    Patient Measurements: Height: 5\' 9"  (175.3 cm) Weight: 240 lb (108.9 kg) IBW/kg (Calculated) : 70.7 Heparin Dosing Weight: 94.5 kg  Vital Signs: Temp: 98.7 F (37.1 C) (08/24 1921) Temp Source: Oral (08/24 1921) BP: 125/66 (08/24 1921) Pulse Rate: 114 (08/24 1921)  Labs:  Recent Labs  11/17/15 1657 11/17/15 1736 11/18/15 0149 11/18/15 0325 11/18/15 0732  11/18/15 1804 11/19/15 0540 11/19/15 1332 11/19/15 2051  HGB 14.0  --   --  12.9*  --   --   --  12.9*  --   --   HCT 39.5*  --   --  36.6*  --   --   --  37.0*  --   --   PLT 270  --   --  257  --   --   --  262  --   --   APTT  --   --  42*  --   --   --   --   --   --   --   LABPROT  --  13.3  --   --   --   --   --   --   --   --   INR  --  1.01  --   --   --   --   --   --   --   --   HEPARINUNFRC  --   --   --  0.15*  --   < > 0.32 0.24* 0.14* 0.30  CREATININE 0.73  --   --   --   --   --  0.78  --   --   --   TROPONINI <0.03  --  0.05*  --  0.06*  --   --   --   --   --   < > = values in this interval not displayed.  Estimated Creatinine Clearance: 129.9 mL/min (by C-G formula based on SCr of 0.8 mg/dL).   Medical History: Past Medical History:  Diagnosis Date  . Pulmonary emboli (HCC)   . Pulmonary embolism (HCC) 2011    Medications:  Scheduled:  . piperacillin-tazobactam (ZOSYN)  IV  3.375 g Intravenous Q8H  . simvastatin  40 mg Oral Daily  . sodium chloride flush  3 mL Intravenous Q12H  . tuberculin  5 Units Intradermal Once  . [START ON 11/20/2015] vancomycin  1,250 mg Intravenous Q8H    Assessment: Pharmacy consulted for heparin drip management for 55 yo male admitted with PE.   Goal of Therapy:  Heparin level 0.3-0.7 units/ml Monitor platelets by anticoagulation protocol: Yes   Plan:  Will bolus heparin 3000 units x 1  and increase rate to 2500units/hr. Will obtain follow-up Anti-Xa level at 2030.    8/24 21:00 heparin level 0.30. Recheck level in 6 hours to confirm.  8/25 03:00 heparin level 0.35. Continue current regimen and recheck with tomorrow AM labs.  Pharmacy will continue to monitor and adjust per consult.   Jailon Schaible S 11/19/2015,11:00 PM

## 2015-11-19 NOTE — Care Management (Signed)
Vascular has consulted and at present, there is no indication for IVC filter.  There is mention of discharge on Eliquis- currently on heparin drip. Patient moved to negative pressure room due to a cavity appearing lesion on chest CT.  Being treated for infectious process but ruling out for TB.   Sputum cultures/AFB pending.  Quaniferon drawn.  It will take several days for this to result.

## 2015-11-20 LAB — BASIC METABOLIC PANEL
ANION GAP: 10 (ref 5–15)
BUN: 13 mg/dL (ref 6–20)
CHLORIDE: 99 mmol/L — AB (ref 101–111)
CO2: 25 mmol/L (ref 22–32)
Calcium: 8.7 mg/dL — ABNORMAL LOW (ref 8.9–10.3)
Creatinine, Ser: 0.69 mg/dL (ref 0.61–1.24)
GFR calc Af Amer: >60 mL/min (ref >60)
GLUCOSE: 121 mg/dL — AB (ref 65–99)
POTASSIUM: 4 mmol/L (ref 3.5–5.1)
SODIUM: 134 mmol/L — AB (ref 135–145)

## 2015-11-20 LAB — ACID FAST SMEAR (AFB): ACID FAST SMEAR - AFSCU2: NEGATIVE

## 2015-11-20 LAB — QUANTIFERON IN TUBE
QFT TB AG MINUS NIL VALUE: 0 IU/mL
QUANTIFERON MITOGEN VALUE: 0.54 IU/mL
QUANTIFERON NIL VALUE: 0.03 [IU]/mL
QUANTIFERON TB AG VALUE: 0.03 IU/mL
QUANTIFERON TB GOLD: NEGATIVE

## 2015-11-20 LAB — QUANTIFERON TB GOLD ASSAY (BLOOD)

## 2015-11-20 LAB — RAPID HIV SCREEN (HIV 1/2 AB+AG)
HIV 1/2 ANTIBODIES: NONREACTIVE
HIV-1 P24 Antigen - HIV24: NONREACTIVE

## 2015-11-20 LAB — ACID FAST SMEAR (AFB, MYCOBACTERIA)

## 2015-11-20 LAB — CBC
HCT: 33.8 % — ABNORMAL LOW (ref 40.0–52.0)
HEMOGLOBIN: 11.9 g/dL — AB (ref 13.0–18.0)
MCH: 33.2 pg (ref 26.0–34.0)
MCHC: 35.2 g/dL (ref 32.0–36.0)
MCV: 94.2 fL (ref 80.0–100.0)
Platelets: 277 10*3/uL (ref 150–440)
RBC: 3.59 MIL/uL — AB (ref 4.40–5.90)
RDW: 12.3 % (ref 11.5–14.5)
WBC: 15.1 10*3/uL — AB (ref 3.8–10.6)

## 2015-11-20 LAB — HEPARIN LEVEL (UNFRACTIONATED): HEPARIN UNFRACTIONATED: 0.35 [IU]/mL (ref 0.30–0.70)

## 2015-11-20 MED ORDER — IPRATROPIUM-ALBUTEROL 0.5-2.5 (3) MG/3ML IN SOLN
3.0000 mL | RESPIRATORY_TRACT | Status: DC
  Administered 2015-11-20 – 2015-11-21 (×3): 3 mL via RESPIRATORY_TRACT
  Filled 2015-11-20 (×4): qty 3

## 2015-11-20 NOTE — Progress Notes (Signed)
[facility] [place] [facility]   PATIENT NAME: Frank Rivera    MR#:  [id number]  DATE OF BIRTH:  1960-10-31  SUBJECTIVE:  CHIEF COMPLAINT:   Chief Complaint  Patient presents with  . Chest Pain  . Shortness of Breath  Patient is a 55 year old Caucasian male with past medical history significant for history of pulmonary embolism in 2001, who presents to the hospital with complaints of chest pain and shortness of breath. In emergency room, he underwent CT scan of the chest which revealed cavitating lesion in the right upper lobe, pulmonary emboli. Patient admits of fevers, few days ago, chills and sweating, no significant weight loss. Recent ankle fracture on the left with swelling of left lower extremity, showing a DVT on Doppler. Pt having fever 11/18/15 night. And WBC count is also going up. Continue to have c/o chest pain.  Review of Systems  Constitutional: Positive for chills, fever and malaise/fatigue. Negative for weight loss.  HENT: Negative for congestion.   Eyes: Negative for blurred vision and double vision.  Respiratory: Positive for cough and shortness of breath. Negative for sputum production and wheezing.   Cardiovascular: Positive for chest pain and leg swelling. Negative for palpitations, orthopnea and PND.  Gastrointestinal: Negative for abdominal pain, blood in stool, constipation, diarrhea, nausea and vomiting.  Genitourinary: Negative for dysuria, frequency, hematuria and urgency.  Musculoskeletal: Negative for falls.  Neurological: Negative for dizziness, tremors, focal weakness and headaches.  Endo/Heme/Allergies: Does not bruise/bleed easily.  Psychiatric/Behavioral: Negative for depression. The patient does not have insomnia.     VITAL SIGNS: Blood pressure (!) 146/85, pulse (!) 110, temperature 98.7 F (37.1 C), temperature source Oral, resp. rate 20, height 5\' 9"  (1.753 m), weight 108.9 kg (240 lb), SpO2 98 %.  PHYSICAL  EXAMINATION:   GENERAL:  55 y.o.-year-old patient lying in the bed in mild distress, dyspneic, but able to speak long sentences.  EYES: Pupils equal, round, reactive to light and accommodation. No scleral icterus. Extraocular muscles intact.  HEENT: Head atraumatic, normocephalic. Oropharynx and nasopharynx clear.  NECK:  Supple, no jugular venous distention. No thyroid enlargement, no tenderness.  LUNGS: Normal breath sounds on the left, decreased on the right, extensive crackles on the right side of the lungs, some dullness to percussion, no wheezing, few scattered rales, rhonchi and crepitations on the right posteriorly and anteriorly . Intermittent use of accessory muscles of respiration.  CARDIOVASCULAR: S1, S2 normal. No murmurs, rubs, or gallops.  ABDOMEN: Soft, nontender, nondistended. Bowel sounds present. No organomegaly or mass.  EXTREMITIES: No pedal edema, cyanosis, or clubbing.  NEUROLOGIC: Cranial nerves II through XII are intact. Muscle strength 5/5 in all extremities. Sensation intact. Gait not checked.  PSYCHIATRIC: The patient is alert and oriented x 3.  SKIN: No obvious rash, lesion, or ulcer.   ORDERS/RESULTS REVIEWED:   CBC  Recent Labs Lab 11/17/15 1657 11/18/15 0325 11/19/15 0540 11/20/15 0334  WBC 8.6 11.6* 17.1* 15.1*  HGB 14.0 12.9* 12.9* 11.9*  HCT 39.5* 36.6* 37.0* 33.8*  PLT 270 257 262 277  MCV 94.4 94.2 94.0 94.2  MCH 33.5 33.1 32.9 33.2  MCHC 35.5 35.1 34.9 35.2  RDW 12.2 12.1 12.1 12.3   ------------------------------------------------------------------------------------------------------------------  Chemistries   Recent Labs Lab 11/17/15 1657 11/18/15 1804 11/20/15 0334  NA 140 132* 134*  K 3.5 4.2 4.0  CL 103 100* 99*  CO2 27 23 25   GLUCOSE 122* 107* 121*  BUN 13 11 13   CREATININE 0.73  0.78 0.69  CALCIUM 9.3 9.0 8.7*  AST  --  17  --   ALT  --  27  --   ALKPHOS  --  90  --   BILITOT  --  1.7*  --     ------------------------------------------------------------------------------------------------------------------ estimated creatinine clearance is 129.9 mL/min (by C-G formula based on SCr of 0.8 mg/dL). ------------------------------------------------------------------------------------------------------------------ No results for input(s): TSH, T4TOTAL, T3FREE, THYROIDAB in the last 72 hours.  Invalid input(s): FREET3  Cardiac Enzymes  Recent Labs Lab 11/17/15 1657 11/18/15 0149 11/18/15 0732  TROPONINI <0.03 0.05* 0.06*   ------------------------------------------------------------------------------------------------------------------ Invalid input(s): POCBNP ---------------------------------------------------------------------------------------------------------------  RADIOLOGY: [place] Venous Img Lower Unilateral Left  Result Date: 11/18/2015 CLINICAL DATA:  Swelling x2 weeks post ankle fracture. EXAM: LEFT LOWER EXTREMITY VENOUS DOPPLER ULTRASOUND TECHNIQUE: Gray-scale sonography with compression, as well as color and duplex ultrasound, were performed to evaluate the deep venous system from the level of the common femoral vein through the popliteal and proximal calf veins. COMPARISON:  None FINDINGS: Occlusive thrombus in posterior tibial and peroneal veins extending through the popliteal vein, with resultant incompressibility. No significant color Doppler signal is evident. There is incompletely occlusive noncompressible thrombus in the femoral and common femoral veins, with some persistent color signal noted on color Doppler. Survey views of the contralateral common femoral vein are unremarkable. IMPRESSION: 1. Extensive left calf and femoral-popliteal DVT as above. These results will be called to the ordering clinician or representative by the Radiologist Assistant, and communication documented in the PACS or zVision Dashboard. Electronically Signed   By: Corlis Leak  Hassell M.D.   On:  11/18/2015 16:33    EKG:  Orders placed or performed during the hospital encounter of 11/17/15  . EKG 12-Lead  . EKG 12-Lead  . ED EKG within 10 minutes  . ED EKG within 10 minutes    ASSESSMENT AND PLAN:  Active Problems:   Acute pulmonary embolism (HCC) #1. Acute respiratory failure with hypoxia due to pulmonary embolism and cavitating lesion, questionable lung abscess, continue oxygen, wean off as tolerated   Appreciated Pulm consult- Advised to continue same plan and follow in office.  #2. Pulmonary embolism, continue heparin for now, change to Coumadin/or liquids/Xarelto when pulmonary evaluations completed   As pt still may need bronch- so will continue heparin drip in hospital.  #3 cavitating lesion of unclear etiology at this time, suspected pneumonia, abscess versus cancer, pulmonary is consulted, recommendations are reviewed. Isolated patient, get AFBs, PPD, on antibiotic therapy with vancomycin and Zosyn, get sputum culture, Will get ID also as pt continue to have fever and rising WBCs.  #4. Leukocytosis, follow with antibiotic therapy #5. Elevated troponin, likely demand ischemia, normal echocardiogram  #6 Left lower extremity swelling due to ankle fracture,  ultrasound of left lower extremity shows DVT,   Appreciated input of vascular surgery, as this is DVT and PE due to and accident and sedentary event, no further intervention needed.  Management plans discussed with the patient, family and they are in agreement.   DRUG ALLERGIES:  Allergies  Allergen Reactions  . Sulfa Antibiotics Other (See Comments)    CODE STATUS:  full    Code Status Orders        Start     Ordered   11/17/15 1907  Full code  Continuous     11/17/15 1907    Code Status History    Date Active Date Inactive Code Status Order ID Comments User Context   This patient has a current code status  but no historical code status.      TOTAL CRITICAL CARE TIME DUE TO COMPLEXITY OF  PROBLEMS TAKING CARE OF THIS PATIENT: 40 minutes.  Discussed with patient's family and patient himself extensively. All questions were answered  Altamese Dilling M.D on 11/20/2015 at 8:42 AM  Between 7am to 6pm - Pager - [phone]  After 6pm go to [email or web address] - password [email or web address] [facility]  [facility]  Office  [phone]  CC: Primary care physician; No PCP Per Patient

## 2015-11-20 NOTE — Progress Notes (Signed)
ANTICOAGULATION CONSULT NOTE  Pharmacy Consult for heparin  Indication: pulmonary embolus  Allergies  Allergen Reactions  . Sulfa Antibiotics Other (See Comments)    Patient Measurements: Height: 5\' 9"  (175.3 cm) Weight: 240 lb (108.9 kg) IBW/kg (Calculated) : 70.7 Heparin Dosing Weight: 94.5 kg  Vital Signs: Temp: 98.1 F (36.7 C) (08/25 1206) Temp Source: Oral (08/25 1206) BP: 111/67 (08/25 1206) Pulse Rate: 111 (08/25 1206)  Labs:  Recent Labs  11/17/15 1657 11/17/15 1736 11/18/15 0149 11/18/15 0325 11/18/15 0732  11/18/15 1804 11/19/15 0540 11/19/15 1332 11/19/15 2051 11/20/15 0334  HGB 14.0  --   --  12.9*  --   --   --  12.9*  --   --  11.9*  HCT 39.5*  --   --  36.6*  --   --   --  37.0*  --   --  33.8*  PLT 270  --   --  257  --   --   --  262  --   --  277  APTT  --   --  42*  --   --   --   --   --   --   --   --   LABPROT  --  13.3  --   --   --   --   --   --   --   --   --   INR  --  1.01  --   --   --   --   --   --   --   --   --   HEPARINUNFRC  --   --   --  0.15*  --   < > 0.32 0.24* 0.14* 0.30 0.35  CREATININE 0.73  --   --   --   --   --  0.78  --   --   --  0.69  TROPONINI <0.03  --  0.05*  --  0.06*  --   --   --   --   --   --   < > = values in this interval not displayed.  Estimated Creatinine Clearance: 129.9 mL/min (by C-G formula based on SCr of 0.8 mg/dL).   Medical History: Past Medical History:  Diagnosis Date  . Pulmonary emboli (HCC)   . Pulmonary embolism (HCC) 2011    Medications:  Scheduled:  . ipratropium-albuterol  3 mL Nebulization Q4H  . piperacillin-tazobactam (ZOSYN)  IV  3.375 g Intravenous Q8H  . simvastatin  40 mg Oral Daily  . sodium chloride flush  3 mL Intravenous Q12H  . vancomycin  1,250 mg Intravenous Q8H    Assessment: Pharmacy consulted for heparin drip management for 55 yo male admitted with PE. Patient currently ordered heparin 2500 units/hr.   Goal of Therapy:  Heparin level 0.3-0.7  units/ml Monitor platelets by anticoagulation protocol: Yes   Plan:  Will obtain follow-up Anti-Xa level with am labs.   Pharmacy will continue to monitor and adjust per consult.   Evetta Renner L 11/20/2015,2:06 PM

## 2015-11-20 NOTE — Progress Notes (Signed)
Patient alert and oriented, remains on heparin drip at 25 unit, iv abt infusing , contact precaution on place.

## 2015-11-20 NOTE — Progress Notes (Signed)
Pharmacy Antibiotic Note  Frank Rivera is a 55 y.o. male admitted on 11/17/2015 with pneumonia.  Pharmacy has been consulted for vancomycin/Zosyn dosing. Patient admitted with PE.   Plan: Will continue vancomycin 1250mg  IV q8hr for goal trough of 15-20. MRSA screen negative; ID consult pending. Will obtain follow-up vanc trough with afternoon dose on 8/25.    Zosyn EI 3.375g IV Q8hr.   Height: 5\' 9"  (175.3 cm) Weight: 240 lb (108.9 kg) IBW/kg (Calculated) : 70.7  Temp (24hrs), Avg:98.6 F (37 C), Min:98.1 F (36.7 C), Max:98.8 F (37.1 C)   Recent Labs Lab 11/17/15 1657 11/18/15 0325 11/18/15 1804 11/19/15 0540 11/19/15 1634 11/20/15 0334  WBC 8.6 11.6*  --  17.1*  --  15.1*  CREATININE 0.73  --  0.78  --   --  0.69  VANCOTROUGH  --   --   --   --  9*  --     Estimated Creatinine Clearance: 129.9 mL/min (by C-G formula based on SCr of 0.8 mg/dL).     Antimicrobials this admission: Vancomycin 8/24 >>  Zosyn 8/24 >>   Dose adjustments this admission: 8/24 vancomycin transitioned from 1000mg  Q8hr to 1250mg  Q8hr.  Microbiology results: 8/24 BCx: no growth < 24 hours 8/24 Sputum: pending  8/23 MRSA PCR: negative   Pharmacy will continue to monitor and adjust per consult.    Simpson,Michael L 11/20/2015 2:07 PM

## 2015-11-20 NOTE — Consult Note (Signed)
[facility] Infectious Disease     Reason for Consult: Cavitary lung lesion    Referring Physician: Carlynn Spry Date of Admission:  11/17/2015   Active Problems:   Acute pulmonary embolism ([place])   HPI: Frank Rivera is a 55 y.o. male admitted 8/22 with SOB and chest ain. He has hx prior PE.  He was noted on CT to have a PE and RUL cavitary lesion.  LE Doppler shows DVT.  He has had fevers to 101.9 and wbc was 11 at admission.  He had fractured tib fib 2 weeks ago and since then has been downhill.  He was given pain meds and thinks they may have been drowsy from then but does not recall any aspiration events. Currently still dyspneic but not much cough, no hemoptysis.  Past Medical History:  Diagnosis Date  . Pulmonary emboli ([place])   . Pulmonary embolism ([place]) 2011   History reviewed. No pertinent surgical history. Social History  Substance Use Topics  . Smoking status: Current Every Day Smoker    Types: Cigarettes  . Smokeless tobacco: Never Used  . Alcohol use No   Family History  Problem Relation Age of Onset  . Diabetes Neg Hx     Allergies:  Allergies  Allergen Reactions  . Sulfa Antibiotics Other (See Comments)    Current antibiotics: Antibiotics Given (last 72 hours)    Date/Time Action Medication Dose Rate   11/18/15 1233 Given   vancomycin (VANCOCIN) IVPB 1000 mg/200 mL premix 1,000 mg 200 mL/hr   11/18/15 1237 Given   piperacillin-tazobactam (ZOSYN) IVPB 3.375 g 3.375 g 12.5 mL/hr   11/18/15 1851 Given   vancomycin (VANCOCIN) IVPB 1000 mg/200 mL premix 1,000 mg 200 mL/hr   11/18/15 2131 Given   piperacillin-tazobactam (ZOSYN) IVPB 3.375 g 3.375 g 12.5 mL/hr   11/19/15 0158 Given   vancomycin (VANCOCIN) IVPB 1000 mg/200 mL premix 1,000 mg 200 mL/hr   11/19/15 0541 Given   piperacillin-tazobactam (ZOSYN) IVPB 3.375 g 3.375 g 12.5 mL/hr   11/19/15 1010 Given   vancomycin (VANCOCIN) IVPB 1000 mg/200 mL premix 1,000 mg 200 mL/hr   11/19/15 1500 Given    piperacillin-tazobactam (ZOSYN) IVPB 3.375 g 3.375 g 12.5 mL/hr   11/19/15 1848 Given   vancomycin (VANCOCIN) IVPB 1000 mg/200 mL premix 1,000 mg 200 mL/hr   11/19/15 2120 Given   piperacillin-tazobactam (ZOSYN) IVPB 3.375 g 3.375 g 12.5 mL/hr   11/20/15 0238 Given   vancomycin (VANCOCIN) 1,250 mg in sodium chloride 0.9 % 250 mL IVPB 1,250 mg 166.7 mL/hr   11/20/15 0545 Given   piperacillin-tazobactam (ZOSYN) IVPB 3.375 g 3.375 g 12.5 mL/hr   11/20/15 1100 Given   vancomycin (VANCOCIN) 1,250 mg in sodium chloride 0.9 % 250 mL IVPB 1,250 mg 166.7 mL/hr      MEDICATIONS: . piperacillin-tazobactam (ZOSYN)  IV  3.375 g Intravenous Q8H  . simvastatin  40 mg Oral Daily  . sodium chloride flush  3 mL Intravenous Q12H  . vancomycin  1,250 mg Intravenous Q8H    Review of Systems - 11 systems reviewed and negative per HPI   OBJECTIVE: Temp:  [98.1 F (36.7 C)-98.8 F (37.1 C)] 98.1 F (36.7 C) (08/25 1206) Pulse Rate:  [108-114] 111 (08/25 1206) Resp:  [18-24] 18 (08/25 1206) BP: (111-146)/(66-85) 111/67 (08/25 1206) SpO2:  [96 %-99 %] 99 % (08/25 1206) Physical Exam  Constitutional: He is oriented to person, place, and time. He appears well-developed and well-nourished. Mildly dyspneic HENT: anicteric, perrla  Mouth/Throat: Oropharynx is clear and moist. No oropharyngeal exudate.  Cardiovascular: Normal rate, regular rhythm and normal heart sounds. Pulmonary/Chest: mild rhonchi Abdominal: Soft. Bowel sounds are normal. He exhibits no distension. There is no tenderness.  Lymphadenopathy:  He has no cervical adenopathy.  Neurological: He is alert and oriented to person, place, and time.  Skin: Skin is warm and dry. No rash noted. No erythema.  Psychiatric: He has a normal mood and affect. His behavior is normal.   LABS: Results for orders placed or performed during the hospital encounter of 11/17/15 (from the past 48 hour(s))  Heparin level (unfractionated)     Status: None    Collection Time: 11/18/15  6:04 PM  Result Value Ref Range   Heparin Unfractionated 0.32 0.30 - 0.70 IU/mL    Comment:        IF HEPARIN RESULTS ARE BELOW EXPECTED VALUES, AND PATIENT DOSAGE HAS BEEN CONFIRMED, SUGGEST FOLLOW UP TESTING OF ANTITHROMBIN III LEVELS.   Comprehensive metabolic panel     Status: Abnormal   Collection Time: 11/18/15  6:04 PM  Result Value Ref Range   Sodium 132 (L) 135 - 145 mmol/L   Potassium 4.2 3.5 - 5.1 mmol/L   Chloride 100 (L) 101 - 111 mmol/L   CO2 23 22 - 32 mmol/L   Glucose, Bld 107 (H) 65 - 99 mg/dL   BUN 11 6 - 20 mg/dL   Creatinine, Ser 0.78 0.61 - 1.24 mg/dL   Calcium 9.0 8.9 - 10.3 mg/dL   Total Protein 7.4 6.5 - 8.1 g/dL   Albumin 3.5 3.5 - 5.0 g/dL   AST 17 15 - 41 U/L   ALT 27 17 - 63 U/L   Alkaline Phosphatase 90 38 - 126 U/L   Total Bilirubin 1.7 (H) 0.3 - 1.2 mg/dL   GFR calc non Af Amer >60 >60 mL/min   GFR calc Af Amer >60 >60 mL/min    Comment: (NOTE) The eGFR has been calculated using the CKD EPI equation. This calculation has not been validated in all clinical situations. eGFR's persistently <60 mL/min signify possible Chronic Kidney Disease.    Anion gap 9 5 - 15  ANA Comprehensive Panel     Status: None   Collection Time: 11/18/15  6:04 PM  Result Value Ref Range   ds DNA Ab <1 0 - 9 IU/mL    Comment: (NOTE)                                   Negative      <5                                   Equivocal  5 - 9                                   Positive      >9    Ribonucleic Protein <0.2 0.0 - 0.9 AI   ENA SM Ab Ser-aCnc <0.2 0.0 - 0.9 AI   Scleroderma (Scl-70) (ENA) Antibody, IgG <0.2 0.0 - 0.9 AI   SSA (Ro) (ENA) Antibody, IgG <0.2 0.0 - 0.9 AI   SSB (La) (ENA) Antibody, IgG <0.2 0.0 - 0.9 AI   Chromatin Ab SerPl-aCnc <0.2 0.0 - 0.9 AI  Anti JO-1 <0.2 0.0 - 0.9 AI   Centromere Ab Screen <0.2 0.0 - 0.9 AI   See below: Comment     Comment: (NOTE) Autoantibody                       Disease  Association ------------------------------------------------------------                        Condition                  Frequency ---------------------   ------------------------   --------- Antinuclear Antibody,    SLE, mixed connective Direct (ANA-D)           tissue diseases ---------------------   ------------------------   --------- dsDNA                    SLE                        40 - 60% ---------------------   ------------------------   --------- Chromatin                Drug induced SLE                90%                         SLE                        48 - 97% ---------------------   ------------------------   --------- SSA (Ro)                 SLE                        25 - 35%                         Sjogren's Syndrome         40 - 70%                         Neonatal Lupus                 100% ---------------------   ------------------------   --------- SSB (La)                 SLE                              10%                         Sjogren's Syndrome              30% ---------------------   -----------------------    --------- Sm (anti-Smith)          SLE                        15 - 30% ---------------------   -----------------------    --------- RNP                      Mixed Connective Tissue  Disease                         95% (U1 nRNP,                SLE                        30 - 50% anti-ribonucleoprotein)  Polymyositis and/or                         Dermatomyositis                 20% ---------------------   ------------------------   --------- Scl-70 (antiDNA          Scleroderma (diffuse)      20 - 35% topoisomerase)           Crest                           13% ---------------------   ------------------------   --------- Jo-1                     Polymyositis and/or                         Dermatomyositis            20 - 40% ---------------------   ------------------------   --------- Centromere B             Scleroderma  -  Crest                         variant                         80% Performed At: [facility] [place], [place] [id number] Lindon Romp MD OF:[id number]   ANCA titers     Status: None   Collection Time: 11/18/15  6:04 PM  Result Value Ref Range   C-ANCA <1:20 Neg:<1:20 titer   P-ANCA <1:20 Neg:<1:20 titer    Comment: (NOTE) The presence of positive fluorescence exhibiting P-ANCA or C-ANCA patterns alone is not specific for the diagnosis of Wegener's Granulomatosis (WG) or microscopic polyangiitis. Decisions about treatment should not be based solely on ANCA IFA results.  The International ANCA Group Consensus recommends follow up testing of positive sera with both PR-3 and MPO-ANCA enzyme immunoassays. As many as 5% serum samples are positive only by EIA. Ref. AM J Clin Pathol 1999;111:507-513.    Atypical P-ANCA titer <1:20 Neg:<1:20 titer    Comment: (NOTE) The atypical pANCA pattern has been observed in a significant percentage of patients with ulcerative colitis, primary sclerosing cholangitis and autoimmune hepatitis. Performed At: [facility] [place], [place] [id number] Lindon Romp MD [id number]   Sedimentation rate     Status: Abnormal   Collection Time: 11/18/15  6:04 PM  Result Value Ref Range   Sed Rate 74 (H) 0 - 20 mm/hr  Rheumatoid factor     Status: None   Collection Time: 11/18/15  6:04 PM  Result Value Ref Range   Rhuematoid fact SerPl-aCnc 12.4 0.0 - 13.9 IU/mL    Comment: (NOTE) Performed At: [facility] [place], [place] [id number] Lindon Romp MD [id number]   CBC  Status: Abnormal   Collection Time: 11/19/15  5:40 AM  Result Value Ref Range   WBC 17.1 (H) 3.8 - 10.6 K/uL   RBC 3.93 (L) 4.40 - 5.90 MIL/uL   Hemoglobin 12.9 (L) 13.0 - 18.0 g/dL   HCT 37.0 (L) 40.0 - 52.0 %   MCV 94.0 80.0 - 100.0 fL   MCH 32.9 26.0 - 34.0 pg   MCHC 34.9 32.0 - 36.0 g/dL    RDW 12.1 11.5 - 14.5 %   Platelets 262 150 - 440 K/uL  Heparin level (unfractionated)     Status: Abnormal   Collection Time: 11/19/15  5:40 AM  Result Value Ref Range   Heparin Unfractionated 0.24 (L) 0.30 - 0.70 IU/mL    Comment:        IF HEPARIN RESULTS ARE BELOW EXPECTED VALUES, AND PATIENT DOSAGE HAS BEEN CONFIRMED, SUGGEST FOLLOW UP TESTING OF ANTITHROMBIN III LEVELS.   Culture, expectorated sputum-assessment     Status: None (Preliminary result)   Collection Time: 11/19/15  8:53 AM  Result Value Ref Range   Specimen Description EXPECTORATED SPUTUM    Special Requests EXPECTORATED SPUTUM    Sputum evaluation      Sputum specimen not acceptable for testing.  Please recollect.   NOTIFIED SUSAN PRESTON ON 11/19/15 AT 1140 FOR RECOLLECT QSD    Report Status PENDING   CULTURE, BLOOD (ROUTINE X 2) w Reflex to ID Panel     Status: None (Preliminary result)   Collection Time: 11/19/15  1:30 PM  Result Value Ref Range   Specimen Description BLOOD  LEFT HAND     Special Requests      BOTTLES DRAWN AEROBIC AND ANAEROBIC  ANA 10ML AER 11ML   Culture NO GROWTH < 24 HOURS    Report Status PENDING   CULTURE, BLOOD (ROUTINE X 2) w Reflex to ID Panel     Status: None (Preliminary result)   Collection Time: 11/19/15  1:31 PM  Result Value Ref Range   Specimen Description BLOOD  LEFT HAND    Special Requests      BOTTLES DRAWN AEROBIC AND ANAEROBIC  ANA 10ML AER 13ML   Culture NO GROWTH < 24 HOURS    Report Status PENDING   Heparin level (unfractionated)     Status: Abnormal   Collection Time: 11/19/15  1:32 PM  Result Value Ref Range   Heparin Unfractionated 0.14 (L) 0.30 - 0.70 IU/mL    Comment:        IF HEPARIN RESULTS ARE BELOW EXPECTED VALUES, AND PATIENT DOSAGE HAS BEEN CONFIRMED, SUGGEST FOLLOW UP TESTING OF ANTITHROMBIN III LEVELS.   Culture, expectorated sputum-assessment     Status: None   Collection Time: 11/19/15  2:30 PM  Result Value Ref Range   Specimen  Description INDUCED SPUTUM    Special Requests Normal    Sputum evaluation THIS SPECIMEN IS ACCEPTABLE FOR SPUTUM CULTURE    Report Status 11/19/2015 FINAL   Culture, respiratory (NON-Expectorated)     Status: None (Preliminary result)   Collection Time: 11/19/15  2:30 PM  Result Value Ref Range   Specimen Description INDUCED SPUTUM    Special Requests Normal Reflexed from [id number]    Gram Stain      FEW WBC PRESENT, PREDOMINANTLY PMN MODERATE SQUAMOUS EPITHELIAL CELLS PRESENT ABUNDANT GRAM NEGATIVE RODS ABUNDANT GRAM POSITIVE COCCI IN PAIRS FEW GRAM POSITIVE RODS RARE YEAST Performed at [facility]    Culture PENDING    Report Status PENDING  Vancomycin, trough     Status: Abnormal   Collection Time: 11/19/15  4:34 PM  Result Value Ref Range   Vancomycin Tr 9 (L) 15 - 20 ug/mL  Heparin level (unfractionated)     Status: None   Collection Time: 11/19/15  8:51 PM  Result Value Ref Range   Heparin Unfractionated 0.30 0.30 - 0.70 IU/mL    Comment:        IF HEPARIN RESULTS ARE BELOW EXPECTED VALUES, AND PATIENT DOSAGE HAS BEEN CONFIRMED, SUGGEST FOLLOW UP TESTING OF ANTITHROMBIN III LEVELS.   CBC     Status: Abnormal   Collection Time: 11/20/15  3:34 AM  Result Value Ref Range   WBC 15.1 (H) 3.8 - 10.6 K/uL   RBC 3.59 (L) 4.40 - 5.90 MIL/uL   Hemoglobin 11.9 (L) 13.0 - 18.0 g/dL   HCT 33.8 (L) 40.0 - 52.0 %   MCV 94.2 80.0 - 100.0 fL   MCH 33.2 26.0 - 34.0 pg   MCHC 35.2 32.0 - 36.0 g/dL   RDW 12.3 11.5 - 14.5 %   Platelets 277 150 - 440 K/uL  Basic metabolic panel     Status: Abnormal   Collection Time: 11/20/15  3:34 AM  Result Value Ref Range   Sodium 134 (L) 135 - 145 mmol/L   Potassium 4.0 3.5 - 5.1 mmol/L   Chloride 99 (L) 101 - 111 mmol/L   CO2 25 22 - 32 mmol/L   Glucose, Bld 121 (H) 65 - 99 mg/dL   BUN 13 6 - 20 mg/dL   Creatinine, Ser 0.69 0.61 - 1.24 mg/dL   Calcium 8.7 (L) 8.9 - 10.3 mg/dL   GFR calc non Af Amer >60 >60 mL/min   GFR calc Af  Amer >60 >60 mL/min    Comment: (NOTE) The eGFR has been calculated using the CKD EPI equation. This calculation has not been validated in all clinical situations. eGFR's persistently <60 mL/min signify possible Chronic Kidney Disease.    Anion gap 10 5 - 15  Heparin level (unfractionated)     Status: None   Collection Time: 11/20/15  3:34 AM  Result Value Ref Range   Heparin Unfractionated 0.35 0.30 - 0.70 IU/mL    Comment:        IF HEPARIN RESULTS ARE BELOW EXPECTED VALUES, AND PATIENT DOSAGE HAS BEEN CONFIRMED, SUGGEST FOLLOW UP TESTING OF ANTITHROMBIN III LEVELS.    No components found for: ESR, C REACTIVE PROTEIN MICRO: Recent Results (from the past 720 hour(s))  MRSA PCR Screening     Status: None   Collection Time: 11/18/15  1:31 PM  Result Value Ref Range Status   MRSA by PCR NEGATIVE NEGATIVE Final    Comment:        The GeneXpert MRSA Assay (FDA approved for NASAL specimens only), is one component of a comprehensive MRSA colonization surveillance program. It is not intended to diagnose MRSA infection nor to guide or monitor treatment for MRSA infections.   Culture, expectorated sputum-assessment     Status: None (Preliminary result)   Collection Time: 11/19/15  8:53 AM  Result Value Ref Range Status   Specimen Description EXPECTORATED SPUTUM  Final   Special Requests EXPECTORATED SPUTUM  Final   Sputum evaluation   Final    Sputum specimen not acceptable for testing.  Please recollect.   NOTIFIED SUSAN PRESTON ON 11/19/15 AT 1140 FOR RECOLLECT QSD    Report Status PENDING  Incomplete  CULTURE, BLOOD (ROUTINE X 2)  w Reflex to ID Panel     Status: None (Preliminary result)   Collection Time: 11/19/15  1:30 PM  Result Value Ref Range Status   Specimen Description BLOOD  LEFT HAND   Final   Special Requests   Final    BOTTLES DRAWN AEROBIC AND ANAEROBIC  ANA 10ML AER 11ML   Culture NO GROWTH < 24 HOURS  Final   Report Status PENDING  Incomplete  CULTURE,  BLOOD (ROUTINE X 2) w Reflex to ID Panel     Status: None (Preliminary result)   Collection Time: 11/19/15  1:31 PM  Result Value Ref Range Status   Specimen Description BLOOD  LEFT HAND  Final   Special Requests   Final    BOTTLES DRAWN AEROBIC AND ANAEROBIC  ANA 10ML AER 13ML   Culture NO GROWTH < 24 HOURS  Final   Report Status PENDING  Incomplete  Culture, expectorated sputum-assessment     Status: None   Collection Time: 11/19/15  2:30 PM  Result Value Ref Range Status   Specimen Description INDUCED SPUTUM  Final   Special Requests Normal  Final   Sputum evaluation THIS SPECIMEN IS ACCEPTABLE FOR SPUTUM CULTURE  Final   Report Status 11/19/2015 FINAL  Final  Culture, respiratory (NON-Expectorated)     Status: None (Preliminary result)   Collection Time: 11/19/15  2:30 PM  Result Value Ref Range Status   Specimen Description INDUCED SPUTUM  Final   Special Requests Normal Reflexed from [id number]  Final   Gram Stain   Final    FEW WBC PRESENT, PREDOMINANTLY PMN MODERATE SQUAMOUS EPITHELIAL CELLS PRESENT ABUNDANT GRAM NEGATIVE RODS ABUNDANT GRAM POSITIVE COCCI IN PAIRS FEW GRAM POSITIVE RODS RARE YEAST Performed at [facility]    Culture PENDING  Incomplete   Report Status PENDING  Incomplete    IMAGING: Dg Chest 2 View  Result Date: 11/17/2015 CLINICAL DATA:  Worsening shortness of breath and left-sided chest pain. EXAM: CHEST  2 VIEW COMPARISON:  None. FINDINGS: Cardiomediastinal silhouette is normal. Mediastinal contours appear intact. There is no evidence of pneumothorax. Ill-defined focal asymmetry seen in the right lung apex. Linear opacities are also noted in the left mid lung field. Osseous structures are without acute abnormality. Soft tissues are grossly normal. IMPRESSION: Ill-defined opacity in the right lung apex, which may represent pleural parenchymal scarring, focal consolidation, loculated pleural effusion or pulmonary mass. Linear opacities in the left  midlung may represent scarring versus atelectasis. If patient presents with findings suggestive of acute pneumonia, follow-up after empiric treatment is recommended. Electronically Signed   By: Fidela Salisbury M.D.   On: 11/17/2015 17:52   Dg Ankle Complete Left  Result Date: 11/01/2015 CLINICAL DATA:  Fall in the garage today. Pain and swelling located laterally. Initial encounter. EXAM: LEFT ANKLE COMPLETE - 3+ VIEW COMPARISON:  None FINDINGS: There is mild soft tissue swelling about the lateral aspect of the ankle. There is a nondisplaced transverse fracture through the lateral malleoli [name]. The distal tibia appears intact. There is no dislocation. Spurring is noted at the medial malleoli S and at the posterior and plantar aspects of the calcaneus. Bone mineralization appears normal. IMPRESSION: Nondisplaced lateral malleolus fracture. Electronically Signed   By: Logan Bores M.D.   On: 11/01/2015 17:00   Ct Angio Chest Pe W And/or Wo Contrast  Result Date: 11/17/2015 CLINICAL DATA:  Chest pain starting 3 hours ago. EXAM: CT ANGIOGRAPHY CHEST WITH CONTRAST TECHNIQUE: Multidetector CT imaging of the  chest was performed using the standard protocol during bolus administration of intravenous contrast. Multiplanar CT image reconstructions and MIPs were obtained to evaluate the vascular anatomy. CONTRAST:  75 cc Isovue 370 intravenously. COMPARISON:  Chest radiograph 11/17/2015 FINDINGS: Mediastinum/Lymph Nodes: There are bilateral central pulmonary emboli with heavy clot burden. A near occlusive large pulmonary embolus is seen in the left main pulmonary artery extending to all lobar branches with occlusive pulmonary emboli within the left lower and lingular lobar and segmental branches. There are slightly more peripheral nonocclusive pulmonary emboli on the right involving the takeoff of all lobar branches. There is near occlusive pulmonary embolus within a segmental branch in the right lower lobe. No  evidence of right heart strain. Lungs/Pleura: There is a cavitary subpleural mass in the lateral aspect of the right upper lobe measuring 4.2 x 2.2 x 2.1 cm. Peripheral pleural based patchy areas of airspace consolidation are seen in the left lower lobe and lingula, and in the lateral aspect of the left upper lobe. Upper abdomen: No acute findings. Musculoskeletal: No chest wall mass or suspicious bone lesions identified. Review of the MIP images confirms the above findings. IMPRESSION: Bilateral pulmonary emboli with having clot burden. No evidence of right heart strain. Cavitary subpleural mass in the lateral aspect of the right upper lobe which may represent area of lung necrosis due to hypoperfusion, lung abscess or cavitary primary lung malignancy. Areas of patchy airspace consolidation in the periphery of the left lung may represent sequela of hypoperfusion. Critical Value/emergent results were called by telephone at the time of interpretation on 11/17/2015 at 7:02 pm to Dr. Merlyn Lot , who verbally acknowledged these results. Electronically Signed   By: Fidela Salisbury M.D.   On: 11/17/2015 19:06   US Venous Img Lower Unilateral Left  Result Date: 11/18/2015 CLINICAL DATA:  Swelling x2 weeks post ankle fracture. EXAM: LEFT LOWER EXTREMITY VENOUS DOPPLER ULTRASOUND TECHNIQUE: Gray-scale sonography with compression, as well as color and duplex ultrasound, were performed to evaluate the deep venous system from the level of the common femoral vein through the popliteal and proximal calf veins. COMPARISON:  None FINDINGS: Occlusive thrombus in posterior tibial and peroneal veins extending through the popliteal vein, with resultant incompressibility. No significant color Doppler signal is evident. There is incompletely occlusive noncompressible thrombus in the femoral and common femoral veins, with some persistent color signal noted on color Doppler. Survey views of the contralateral common femoral  vein are unremarkable. IMPRESSION: 1. Extensive left calf and femoral-popliteal DVT as above. These results will be called to the ordering clinician or representative by the Radiologist Assistant, and communication documented in the PACS or zVision Dashboard. Electronically Signed   By: Lucrezia Europe M.D.   On: 11/18/2015 16:33    Assessment:   TYNER CODNER is a 55 y.o. male admitted with PE and RUL cavitary lesion as well as fevers, leukocytosis.  He is now anticoagulated, has seen Dr Chancy Milroy who recommends follow up imaging and treatment with antibiotics. QFG is pending and AFB and routine cultures pending.   He has not had a prodrome illness prior and was on some sedating meds so I suspect possible aspiration. Could be malignancy since a smoker with a blood clot so will need close follow up.  Blood clot seems associated with his recent leg fracture  Recommendations Continue zosyn Can stop vanco since PCR neg  If stable and culture neg can dc on oral augmentin for total 14 days and I can see in  follow up if needed. He should fu with Dr Humphrey Rolls for repeat imaging.  Thank you very much for allowing me to participate in the care of this patient. Please call with questions.   Cheral Marker. Ola Spurr, MD

## 2015-11-20 NOTE — Progress Notes (Signed)
[facility] - [place] at [facility]   PATIENT NAME: Frank Rivera    MR#:  [id number]  DATE OF BIRTH:  1960/04/30  SUBJECTIVE: he still continue to feel SOB, and asking"when can I go home?"  CHIEF COMPLAINT:   Chief Complaint  Patient presents with  . Chest Pain  . Shortness of Breath  Patient is a 55 year old Caucasian male with past medical history significant for history of pulmonary embolism in 2001, who presents to the hospital with complaints of chest pain and shortness of breath. In emergency room, he underwent CT scan of the chest which revealed cavitating lesion in the right upper lobe, pulmonary emboli. Patient admits of fevers, few days ago, chills and sweating, no significant weight loss. Recent ankle fracture on the left with swelling of left lower extremity, showing a DVT on Doppler. Pt having fever 11/18/15 night. And WBC count is also going up. Continue to have c/o chest pain.  No fever last 24 hours.  Review of Systems  Constitutional: Positive for chills, fever and malaise/fatigue. Negative for weight loss.  HENT: Negative for congestion.   Eyes: Negative for blurred vision and double vision.  Respiratory: Positive for cough and shortness of breath. Negative for sputum production and wheezing.   Cardiovascular: Positive for chest pain and leg swelling. Negative for palpitations, orthopnea and PND.  Gastrointestinal: Negative for abdominal pain, blood in stool, constipation, diarrhea, nausea and vomiting.  Genitourinary: Negative for dysuria, frequency, hematuria and urgency.  Musculoskeletal: Negative for falls.  Neurological: Negative for dizziness, tremors, focal weakness and headaches.  Endo/Heme/Allergies: Does not bruise/bleed easily.  Psychiatric/Behavioral: Negative for depression. The patient does not have insomnia.     VITAL SIGNS: Blood pressure 111/67, pulse (!) 111, temperature 98.1 F (36.7 C), temperature source Oral, resp. rate  18, height 5\' 9"  (1.753 m), weight 108.9 kg (240 lb), SpO2 99 %.  PHYSICAL EXAMINATION:   GENERAL:  55 y.o.-year-old patient lying in the bed in mild distress, dyspneic, but able to speak long sentences.  EYES: Pupils equal, round, reactive to light and accommodation. No scleral icterus. Extraocular muscles intact.  HEENT: Head atraumatic, normocephalic. Oropharynx and nasopharynx clear.  NECK:  Supple, no jugular venous distention. No thyroid enlargement, no tenderness.  LUNGS: Normal breath sounds on the left, decreased on the right, extensive crackles on the right side of the lungs, some dullness to percussion, no wheezing, few scattered rales, rhonchi and crepitations on the right posteriorly and anteriorly . Intermittent use of accessory muscles of respiration. Require supplemental oxygen. CARDIOVASCULAR: S1, S2 normal. No murmurs, rubs, or gallops.  ABDOMEN: Soft, nontender, nondistended. Bowel sounds present. No organomegaly or mass.  EXTREMITIES: No pedal edema, cyanosis, or clubbing.  NEUROLOGIC: Cranial nerves II through XII are intact. Muscle strength 5/5 in all extremities. Sensation intact. Gait not checked.  PSYCHIATRIC: The patient is alert and oriented x 3.  SKIN: No obvious rash, lesion, or ulcer.   ORDERS/RESULTS REVIEWED:   CBC  Recent Labs Lab 11/17/15 1657 11/18/15 0325 11/19/15 0540 11/20/15 0334  WBC 8.6 11.6* 17.1* 15.1*  HGB 14.0 12.9* 12.9* 11.9*  HCT 39.5* 36.6* 37.0* 33.8*  PLT 270 257 262 277  MCV 94.4 94.2 94.0 94.2  MCH 33.5 33.1 32.9 33.2  MCHC 35.5 35.1 34.9 35.2  RDW 12.2 12.1 12.1 12.3   ------------------------------------------------------------------------------------------------------------------  Chemistries   Recent Labs Lab 11/17/15 1657 11/18/15 1804 11/20/15 0334  NA 140 132* 134*  K 3.5 4.2 4.0  CL 103 100*  99*  CO2 27 23 25   GLUCOSE 122* 107* 121*  BUN 13 11 13   CREATININE 0.73 0.78 0.69  CALCIUM 9.3 9.0 8.7*  AST  --   17  --   ALT  --  27  --   ALKPHOS  --  90  --   BILITOT  --  1.7*  --    ------------------------------------------------------------------------------------------------------------------ estimated creatinine clearance is 129.9 mL/min (by C-G formula based on SCr of 0.8 mg/dL). ------------------------------------------------------------------------------------------------------------------ No results for input(s): TSH, T4TOTAL, T3FREE, THYROIDAB in the last 72 hours.  Invalid input(s): FREET3  Cardiac Enzymes  Recent Labs Lab 11/17/15 1657 11/18/15 0149 11/18/15 0732  TROPONINI <0.03 0.05* 0.06*   ------------------------------------------------------------------------------------------------------------------ Invalid input(s): POCBNP ---------------------------------------------------------------------------------------------------------------  RADIOLOGY: No results found.  EKG:  Orders placed or performed during the hospital encounter of 11/17/15  . EKG 12-Lead  . EKG 12-Lead  . ED EKG within 10 minutes  . ED EKG within 10 minutes    ASSESSMENT AND PLAN:  Active Problems:   Acute pulmonary embolism (HCC) #1. Acute respiratory failure with hypoxia due to pulmonary embolism and cavitating lesion, questionable lung abscess, continue oxygen, wean off as tolerated   Appreciated Pulm consult- Advised to continue same plan and follow in office.   Pt have massive PE with a large DVT clot, he appears in using accesory respi muscles still.    I called vascular again to re-consider decision about IVC filter.  #2. Pulmonary embolism, continue heparin for now, change to Coumadin/or liquids/Xarelto when pulmonary evaluations completed   As pt still may need bronch- so will continue heparin drip in hospital.   May switch to oral on d/c.  #3 cavitating lesion of unclear etiology at this time, suspected pneumonia, abscess versus cancer, pulmonary is consulted, recommendations are  reviewed. Isolated patient, get AFBs, PPD, on antibiotic therapy with vancomycin and Zosyn, get sputum culture, Appreciated help by ID.   Stop vanc.   2 more AFB sputum to send today.  #4. Leukocytosis, follow with antibiotic therapy #5. Elevated troponin, likely demand ischemia, normal echocardiogram  #6 Left lower extremity swelling due to ankle fracture,  ultrasound of left lower extremity shows DVT,   Appreciated input of vascular surgery, as this is DVT and PE due to and accident and sedentary event, no further intervention needed.  Management plans discussed with the patient, family and they are in agreement.   DRUG ALLERGIES:  Allergies  Allergen Reactions  . Sulfa Antibiotics Other (See Comments)    CODE STATUS:  full    Code Status Orders        Start     Ordered   11/17/15 1907  Full code  Continuous     11/17/15 1907    Code Status History    Date Active Date Inactive Code Status Order ID Comments User Context   This patient has a current code status but no historical code status.      TOTAL CRITICAL CARE TIME DUE TO COMPLEXITY OF PROBLEMS TAKING CARE OF THIS PATIENT: 40 minutes.  Discussed with patient's family and patient himself extensively. All questions were answered  Altamese DillingVACHHANI, Isaiah Torok M.D on 11/20/2015 at 6:02 PM  Between 7am to 6pm - Pager - [phone]  After 6pm go to [email or web address] - password [email or web address] [facility]  [facility]  Office  [phone]  CC: Primary care physician; No PCP Per Patient

## 2015-11-21 LAB — BASIC METABOLIC PANEL
ANION GAP: 9 (ref 5–15)
BUN: 14 mg/dL (ref 6–20)
CO2: 29 mmol/L (ref 22–32)
Calcium: 8.6 mg/dL — ABNORMAL LOW (ref 8.9–10.3)
Chloride: 97 mmol/L — ABNORMAL LOW (ref 101–111)
Creatinine, Ser: 0.76 mg/dL (ref 0.61–1.24)
GFR calc Af Amer: >60 mL/min (ref >60)
Glucose, Bld: 122 mg/dL — ABNORMAL HIGH (ref 65–99)
POTASSIUM: 3.6 mmol/L (ref 3.5–5.1)
SODIUM: 135 mmol/L (ref 135–145)

## 2015-11-21 LAB — CBC
HCT: 33.5 % — ABNORMAL LOW (ref 40.0–52.0)
HEMOGLOBIN: 11.7 g/dL — AB (ref 13.0–18.0)
MCH: 33 pg (ref 26.0–34.0)
MCHC: 34.9 g/dL (ref 32.0–36.0)
MCV: 94.7 fL (ref 80.0–100.0)
Platelets: 313 10*3/uL (ref 150–440)
RBC: 3.54 MIL/uL — AB (ref 4.40–5.90)
RDW: 12.2 % (ref 11.5–14.5)
WBC: 12.4 10*3/uL — AB (ref 3.8–10.6)

## 2015-11-21 LAB — ACID FAST SMEAR (AFB)

## 2015-11-21 LAB — HEPARIN LEVEL (UNFRACTIONATED): HEPARIN UNFRACTIONATED: 0.36 [IU]/mL (ref 0.30–0.70)

## 2015-11-21 LAB — ACID FAST SMEAR (AFB, MYCOBACTERIA): Acid Fast Smear: NEGATIVE

## 2015-11-21 MED ORDER — IPRATROPIUM-ALBUTEROL 0.5-2.5 (3) MG/3ML IN SOLN: 3.0000 mL | RESPIRATORY_TRACT | Status: DC | PRN

## 2015-11-21 NOTE — Progress Notes (Addendum)
[facility] - [place] at [facility]   PATIENT NAME: Frank Rivera    MR#:  [id number]  DATE OF BIRTH:  1960-08-14  SUBJECTIVE: he still continue to feel SOB, and asking"when can I go home?"  CHIEF COMPLAINT:   Chief Complaint  Patient presents with  . Chest Pain  . Shortness of Breath  Patient is a 55 year old Caucasian male with past medical history significant for history of pulmonary embolism in 2001, who presents to the hospital with complaints of chest pain and shortness of breath. In emergency room, he underwent CT scan of the chest which revealed cavitating lesion in the right upper lobe, pulmonary emboli. Patient admits of fevers, few days ago, chills and sweating, no significant weight loss. Recent ankle fracture on the left with swelling of left lower extremity, showing a DVT on Doppler. Pt having fever 11/18/15 night. And WBC count is also going up.   Chest pain on left side while breathing. On O2 [place] 3 L.  Review of Systems  Constitutional: Positive for malaise/fatigue. Negative for chills, fever and weight loss.  HENT: Negative for congestion.   Eyes: Negative for blurred vision and double vision.  Respiratory: Positive for cough and shortness of breath. Negative for sputum production and wheezing.   Cardiovascular: Positive for chest pain and leg swelling. Negative for palpitations, orthopnea and PND.  Gastrointestinal: Negative for abdominal pain, blood in stool, constipation, diarrhea, nausea and vomiting.  Genitourinary: Negative for dysuria, frequency, hematuria and urgency.  Musculoskeletal: Negative for falls.  Neurological: Negative for dizziness, tremors, focal weakness and headaches.  Endo/Heme/Allergies: Does not bruise/bleed easily.  Psychiatric/Behavioral: Negative for depression. The patient does not have insomnia.     VITAL SIGNS: Blood pressure 118/73, pulse 100, temperature 98.2 F (36.8 C), temperature source Oral, resp. rate 18,  height 5\' 9"  (1.753 m), weight 240 lb (108.9 kg), SpO2 100 %.  PHYSICAL EXAMINATION:   GENERAL:  55 y.o.-year-old patient lying in the bed in mild distress, dyspneic, but able to speak long sentences.  EYES: Pupils equal, round, reactive to light and accommodation. No scleral icterus. Extraocular muscles intact.  HEENT: Head atraumatic, normocephalic. Oropharynx and nasopharynx clear.  NECK:  Supple, no jugular venous distention. No thyroid enlargement, no tenderness.  LUNGS: Normal breath sounds on the left, decreased on the right, extensive crackles on the right side of the lungs, some dullness to percussion, no wheezing, few scattered rales, rhonchi and crepitations on the right posteriorly and anteriorly . Intermittent use of accessory muscles of respiration. Require supplemental oxygen. CARDIOVASCULAR: S1, S2 normal. No murmurs, rubs, or gallops.  ABDOMEN: Soft, nontender, nondistended. Bowel sounds present. No organomegaly or mass.  EXTREMITIES: No pedal edema, cyanosis, or clubbing.  NEUROLOGIC: Cranial nerves II through XII are intact. Muscle strength 5/5 in all extremities. Sensation intact. Gait not checked.  PSYCHIATRIC: The patient is alert and oriented x 3.  SKIN: No obvious rash, lesion, or ulcer.   ORDERS/RESULTS REVIEWED:   CBC  Recent Labs Lab 11/17/15 1657 11/18/15 0325 11/19/15 0540 11/20/15 0334 11/21/15 0709  WBC 8.6 11.6* 17.1* 15.1* 12.4*  HGB 14.0 12.9* 12.9* 11.9* 11.7*  HCT 39.5* 36.6* 37.0* 33.8* 33.5*  PLT 270 257 262 277 313  MCV 94.4 94.2 94.0 94.2 94.7  MCH 33.5 33.1 32.9 33.2 33.0  MCHC 35.5 35.1 34.9 35.2 34.9  RDW 12.2 12.1 12.1 12.3 12.2   ------------------------------------------------------------------------------------------------------------------  Chemistries   Recent Labs Lab 11/17/15 1657 11/18/15 1804 11/20/15 0334 11/21/15 7829  NA 140 132* 134* 135  K 3.5 4.2 4.0 3.6  CL 103 100* 99* 97*  CO2 27 23 25 29   GLUCOSE 122*  107* 121* 122*  BUN 13 11 13 14   CREATININE 0.73 0.78 0.69 0.76  CALCIUM 9.3 9.0 8.7* 8.6*  AST  --  17  --   --   ALT  --  27  --   --   ALKPHOS  --  90  --   --   BILITOT  --  1.7*  --   --    ------------------------------------------------------------------------------------------------------------------ estimated creatinine clearance is 129.9 mL/min (by C-G formula based on SCr of 0.8 mg/dL). ------------------------------------------------------------------------------------------------------------------ No results for input(s): TSH, T4TOTAL, T3FREE, THYROIDAB in the last 72 hours.  Invalid input(s): FREET3  Cardiac Enzymes  Recent Labs Lab 11/17/15 1657 11/18/15 0149 11/18/15 0732  TROPONINI <0.03 0.05* 0.06*   ------------------------------------------------------------------------------------------------------------------ Invalid input(s): POCBNP ---------------------------------------------------------------------------------------------------------------  RADIOLOGY: No results found.  EKG:  Orders placed or performed during the hospital encounter of 11/17/15  . EKG 12-Lead  . EKG 12-Lead  . ED EKG within 10 minutes  . ED EKG within 10 minutes    ASSESSMENT AND PLAN:  Active Problems:   Acute pulmonary embolism (HCC) #1. Acute respiratory failure with hypoxia due to pulmonary embolism and cavitating lesion, questionable lung abscess, continue oxygen, wean off as tolerated   Appreciated Pulm consult- Advised to continue same plan and follow in office.   Pt have massive PE with a large DVT clot, he appears in using accesory respi muscles still. Per vascular surgeon reevaluation, no indication of IVC filter this time.  #2. Pulmonary embolism, continue heparin for now, change to eliquis upon discharge.  As pt still may need bronch- so will continue heparin drip in hospital.  #3 cavitating lesion of unclear etiology at this time, suspected pneumonia, abscess  versus cancer, pulmonary is consulted, recommendations are reviewed. Isolated patient, follow-up AFBs, PPD, on antibiotic therapy with vancomycin and Zosyn, get sputum culture, Appreciated help by ID.   Stopped vanc.  #4. Leukocytosis, improving. #5. Elevated troponin, likely demand ischemia, normal echocardiogram  #6 Left lower extremity swelling due to ankle fracture,  ultrasound of left lower extremity shows DVT,   Appreciated input of vascular surgery, as this is DVT and PE due to and accident and sedentary event, no further intervention needed.  * tobacco abuse. Smoking cessation was counseled for 3-4 min.  Management plans discussed with the patient, his wife,  and they are in agreement.   DRUG ALLERGIES:  Allergies  Allergen Reactions  . Sulfa Antibiotics Other (See Comments)    CODE STATUS:  full    Code Status Orders        Start     Ordered   11/17/15 1907  Full code  Continuous     11/17/15 1907    Code Status History    Date Active Date Inactive Code Status Order ID Comments User Context   This patient has a current code status but no historical code status.      TOTAL CRITICAL CARE TIME DUE TO COMPLEXITY OF PROBLEMS TAKING CARE OF THIS PATIENT: 45 minutes.   Shaune Pollackhen, Cierria Height M.D on 11/21/2015 at 3:11 PM  Between 7am to 6pm - Pager - [phone]  After 6pm go to [email or web address] - password [email or web address] [facility]  [facility]  Office  [phone]  CC: Primary care physician; No PCP Per Patient

## 2015-11-21 NOTE — Progress Notes (Signed)
ANTICOAGULATION CONSULT NOTE  Pharmacy Consult for heparin  Indication: pulmonary embolus  Allergies  Allergen Reactions  . Sulfa Antibiotics Other (See Comments)    Patient Measurements: Height: 5\' 9"  (175.3 cm) Weight: 240 lb (108.9 kg) IBW/kg (Calculated) : 70.7 Heparin Dosing Weight: 94.5 kg  Vital Signs: Temp: 99 F (37.2 C) (08/26 0442) Temp Source: Oral (08/26 0442) BP: 140/72 (08/26 0442) Pulse Rate: 112 (08/26 0442)  Labs:  Recent Labs  11/18/15 1804  11/19/15 0540  11/19/15 2051 11/20/15 0334 11/21/15 0709  HGB  --   < > 12.9*  --   --  11.9* 11.7*  HCT  --   --  37.0*  --   --  33.8* 33.5*  PLT  --   --  262  --   --  277 313  HEPARINUNFRC 0.32  --  0.24*  < > 0.30 0.35 0.36  CREATININE 0.78  --   --   --   --  0.69 0.76  < > = values in this interval not displayed.  Estimated Creatinine Clearance: 129.9 mL/min (by C-G formula based on SCr of 0.8 mg/dL).   Medical History: Past Medical History:  Diagnosis Date  . Pulmonary emboli (HCC)   . Pulmonary embolism (HCC) 2011    Medications:  Scheduled:  . ipratropium-albuterol  3 mL Nebulization Q4H  . piperacillin-tazobactam (ZOSYN)  IV  3.375 g Intravenous Q8H  . simvastatin  40 mg Oral Daily  . sodium chloride flush  3 mL Intravenous Q12H    Assessment: Pharmacy consulted for heparin drip management for 55 yo male admitted with PE. Patient currently ordered heparin 2500 units/hr.   Goal of Therapy:  Heparin level 0.3-0.7 units/ml Monitor platelets by anticoagulation protocol: Yes   Plan:  HL= 0.36.  Will continue to follow-up Heparin level with am labs.   Pharmacy will continue to monitor and adjust per consult.   Clevester Helzer A 11/21/2015,7:57 AM

## 2015-11-21 NOTE — Progress Notes (Signed)
Vascular Surgery  Asked to reevaluate patient for potential IVC filter placement.  Hemodynamically he has remained stable/unchanged. Resp rate 18-20's on 3 L [place]. HR 100-110s.  The patient states he feels better and that his breathing has improved although he still has some dyspnea and plueretic chest pain with coughing.  Patient is tolerating anticoagulation-Heparin.  At this time there is still no recommendation or requirement for IVC filter placement as he has not failed anticoagulation or become hemodynamically unstable.  Continue current medical management with anticoagulation, ABX, pulmonary toilet and await TB results.

## 2015-11-22 LAB — HEPARIN LEVEL (UNFRACTIONATED): Heparin Unfractionated: 0.47 IU/mL (ref 0.30–0.70)

## 2015-11-22 LAB — CULTURE, RESPIRATORY W GRAM STAIN: Special Requests: NORMAL

## 2015-11-22 LAB — CBC
HEMATOCRIT: 36.7 % — AB (ref 40.0–52.0)
HEMOGLOBIN: 12.8 g/dL — AB (ref 13.0–18.0)
MCH: 32.9 pg (ref 26.0–34.0)
MCHC: 34.8 g/dL (ref 32.0–36.0)
MCV: 94.7 fL (ref 80.0–100.0)
Platelets: 325 10*3/uL (ref 150–440)
RBC: 3.88 MIL/uL — AB (ref 4.40–5.90)
RDW: 12.4 % (ref 11.5–14.5)
WBC: 9.1 10*3/uL (ref 3.8–10.6)

## 2015-11-22 LAB — CULTURE, RESPIRATORY: CULTURE: NORMAL

## 2015-11-22 LAB — MAGNESIUM: Magnesium: 2.3 mg/dL (ref 1.7–2.4)

## 2015-11-22 MED ORDER — BISACODYL 5 MG PO TBEC
5.0000 mg | DELAYED_RELEASE_TABLET | Freq: Every day | ORAL | Status: DC | PRN
  Filled 2015-11-22: qty 1

## 2015-11-22 MED ORDER — DOCUSATE SODIUM 100 MG PO CAPS
100.0000 mg | ORAL_CAPSULE | Freq: Two times a day (BID) | ORAL | Status: DC
  Administered 2015-11-22 – 2015-11-23 (×3): 100 mg via ORAL
  Filled 2015-11-22 (×3): qty 1

## 2015-11-22 NOTE — Progress Notes (Signed)
[facility] - [place] at [facility]   PATIENT NAME: Frank Rivera    MR#:  [id number]  DATE OF BIRTH:  1960/09/29  SUBJECTIVE: better SOB, has chest pain while deep breathing. CHIEF COMPLAINT:   Chief Complaint  Patient presents with  . Chest Pain  . Shortness of Breath  Patient is a 55 year old Caucasian male with past medical history significant for history of pulmonary embolism in 2001, who presents to the hospital with complaints of chest pain and shortness of breath. In emergency room, he underwent CT scan of the chest which revealed cavitating lesion in the right upper lobe, pulmonary emboli. Patient admits of fevers, few days ago, chills and sweating, no significant weight loss. Recent ankle fracture on the left with swelling of left lower extremity, showing a DVT on Doppler. Pt having fever 11/18/15 night. And WBC count is also going up.    better SOB, has left side chest pain while deep breathing. On O2 [place] 3 L.  Review of Systems  Constitutional: Positive for malaise/fatigue. Negative for chills, fever and weight loss.  HENT: Negative for congestion.   Eyes: Negative for blurred vision and double vision.  Respiratory: Positive for cough and shortness of breath. Negative for hemoptysis, sputum production and wheezing.   Cardiovascular: Positive for chest pain and leg swelling. Negative for palpitations, orthopnea and PND.  Gastrointestinal: Negative for abdominal pain, blood in stool, constipation, diarrhea, nausea and vomiting.  Genitourinary: Negative for dysuria, frequency, hematuria and urgency.  Musculoskeletal: Negative for falls.  Neurological: Negative for dizziness, tremors, focal weakness and headaches.  Endo/Heme/Allergies: Does not bruise/bleed easily.  Psychiatric/Behavioral: Negative for depression. The patient does not have insomnia.     VITAL SIGNS: Blood pressure 134/72, pulse (!) 105, temperature 98.6 F (37 C), temperature source Oral,  resp. rate (!) 22, height 5\' 9"  (1.753 m), weight 240 lb (108.9 kg), SpO2 98 %.  PHYSICAL EXAMINATION:   GENERAL:  55 y.o.-year-old patient lying in the bed in mild distress, dyspneic, but able to speak long sentences.  EYES: Pupils equal, round, reactive to light and accommodation. No scleral icterus. Extraocular muscles intact.  HEENT: Head atraumatic, normocephalic. Oropharynx and nasopharynx clear.  NECK:  Supple, no jugular venous distention. No thyroid enlargement, no tenderness.  LUNGS: Normal breath sounds bilaterally, mild crackles on bases, no wheezing or rhonchi. no use of accessory muscles of respiration. Require supplemental oxygen.  CARDIOVASCULAR: S1, S2 normal. Mild tachycardia. No murmurs, rubs, or gallops.  ABDOMEN: Soft, nontender, nondistended. Bowel sounds present. No organomegaly or mass.  EXTREMITIES: No pedal edema, cyanosis, or clubbing. Left calf mild swelling, no tenderness. NEUROLOGIC: Cranial nerves II through XII are intact. Muscle strength 5/5 in all extremities. Sensation intact. Gait not checked.  PSYCHIATRIC: The patient is alert and oriented x 3.  SKIN: No obvious rash, lesion, or ulcer.   ORDERS/RESULTS REVIEWED:   CBC  Recent Labs Lab 11/18/15 0325 11/19/15 0540 11/20/15 0334 11/21/15 0709 11/22/15 0621  WBC 11.6* 17.1* 15.1* 12.4* 9.1  HGB 12.9* 12.9* 11.9* 11.7* 12.8*  HCT 36.6* 37.0* 33.8* 33.5* 36.7*  PLT 257 262 277 313 325  MCV 94.2 94.0 94.2 94.7 94.7  MCH 33.1 32.9 33.2 33.0 32.9  MCHC 35.1 34.9 35.2 34.9 34.8  RDW 12.1 12.1 12.3 12.2 12.4   ------------------------------------------------------------------------------------------------------------------  Chemistries   Recent Labs Lab 11/17/15 1657 11/18/15 1804 11/20/15 0334 11/21/15 0709 11/22/15 0621  NA 140 132* 134* 135  --   K 3.5 4.2 4.0 3.6  --  CL 103 100* 99* 97*  --   CO2 27 23 25 29   --   GLUCOSE 122* 107* 121* 122*  --   BUN 13 11 13 14   --   CREATININE  0.73 0.78 0.69 0.76  --   CALCIUM 9.3 9.0 8.7* 8.6*  --   MG  --   --   --   --  2.3  AST  --  17  --   --   --   ALT  --  27  --   --   --   ALKPHOS  --  90  --   --   --   BILITOT  --  1.7*  --   --   --    ------------------------------------------------------------------------------------------------------------------ estimated creatinine clearance is 129.9 mL/min (by C-G formula based on SCr of 0.8 mg/dL). ------------------------------------------------------------------------------------------------------------------ No results for input(s): TSH, T4TOTAL, T3FREE, THYROIDAB in the last 72 hours.  Invalid input(s): FREET3  Cardiac Enzymes  Recent Labs Lab 11/17/15 1657 11/18/15 0149 11/18/15 0732  TROPONINI <0.03 0.05* 0.06*   ------------------------------------------------------------------------------------------------------------------ Invalid input(s): POCBNP ---------------------------------------------------------------------------------------------------------------  RADIOLOGY: No results found.  EKG:  Orders placed or performed during the hospital encounter of 11/17/15  . EKG 12-Lead  . EKG 12-Lead  . ED EKG within 10 minutes  . ED EKG within 10 minutes    ASSESSMENT AND PLAN:  Active Problems:   Acute pulmonary embolism (HCC) #1. Acute respiratory failure with hypoxia due to pulmonary embolism and cavitating lesion, questionable lung abscess, continue oxygen, wean off as tolerated   Appreciated Pulm consult- Advised to continue same plan and follow in office.   Pt have massive PE with a large DVT clot, per vascular surgeon reevaluation, no indication of IVC filter this time.   #2. Pulmonary embolism, continue heparin for now, change to eliquis upon discharge.  As pt still may need bronch- so will continue heparin drip in hospital.  #3 cavitating lesion of unclear etiology at this time, suspected pneumonia, abscess versus cancer, pulmonary is consulted,  recommendations are reviewed. Isolated patient, follow-up AFBs, PPD, was on antibiotic therapy with vancomycin and Zosyn, get sputum culture, Appreciated help by ID. Vancomycin was discontinued, on zosyn only.   #4. Leukocytosis, improved. #5. Elevated troponin, likely demand ischemia, normal echocardiogram  #6 Left lower extremity swelling due to ankle fracture,  ultrasound of left lower extremity shows DVT,   Appreciated input of vascular surgery, as this is DVT and PE due to and accident and sedentary event, no further intervention needed.   * tobacco abuse. Smoking cessation was counseled for 3-4 min.  Management plans discussed with the patient, his wife,  and they are in agreement.   DRUG ALLERGIES:  Allergies  Allergen Reactions  . Sulfa Antibiotics Other (See Comments)    CODE STATUS:  full    Code Status Orders        Start     Ordered   11/17/15 1907  Full code  Continuous     11/17/15 1907    Code Status History    Date Active Date Inactive Code Status Order ID Comments User Context   This patient has a current code status but no historical code status.      TOTAL CRITICAL CARE TIME DUE TO COMPLEXITY OF PROBLEMS TAKING CARE OF THIS PATIENT: 45 minutes.   Shaune Pollack M.D on 11/22/2015 at 12:11 PM  Between 7am to 6pm - Pager - [phone]  After 6pm go to [email or web address] - password  [facility]  [facility]  Office  [phone]  CC: Primary care physician; No PCP Per Patient

## 2015-11-22 NOTE — Progress Notes (Signed)
ANTICOAGULATION CONSULT NOTE  Pharmacy Consult for heparin  Indication: pulmonary embolus  Allergies  Allergen Reactions  . Sulfa Antibiotics Other (See Comments)    Patient Measurements: Height: 5\' 9"  (175.3 cm) Weight: 240 lb (108.9 kg) IBW/kg (Calculated) : 70.7 Heparin Dosing Weight: 94.5 kg  Vital Signs: Temp: 98.4 F (36.9 C) (08/27 0613) Temp Source: Oral (08/27 60450613) BP: 141/66 (08/27 40980613) Pulse Rate: 106 (08/27 0613)  Labs:  Recent Labs  11/20/15 0334 11/21/15 0709 11/22/15 0621  HGB 11.9* 11.7* 12.8*  HCT 33.8* 33.5* 36.7*  PLT 277 313 325  HEPARINUNFRC 0.35 0.36 0.47  CREATININE 0.69 0.76  --     Estimated Creatinine Clearance: 129.9 mL/min (by C-G formula based on SCr of 0.8 mg/dL).   Medical History: Past Medical History:  Diagnosis Date  . Pulmonary emboli (HCC)   . Pulmonary embolism (HCC) 2011    Medications:  Scheduled:  . piperacillin-tazobactam (ZOSYN)  IV  3.375 g Intravenous Q8H  . simvastatin  40 mg Oral Daily  . sodium chloride flush  3 mL Intravenous Q12H    Assessment: Pharmacy consulted for heparin drip management for 55 yo male admitted with PE. Patient currently ordered heparin 2500 units/hr.   Goal of Therapy:  Heparin level 0.3-0.7 units/ml Monitor platelets by anticoagulation protocol: Yes   Plan:  HL= 0.47.  Will continue current drip at 2500 units/hr and follow-up Heparin level with am labs.   Pharmacy will continue to monitor and adjust per consult.   Britny Riel A 11/22/2015,8:00 AM

## 2015-11-23 LAB — HEPARIN LEVEL (UNFRACTIONATED): HEPARIN UNFRACTIONATED: 0.4 [IU]/mL (ref 0.30–0.70)

## 2015-11-23 LAB — CBC
HCT: 31 % — ABNORMAL LOW (ref 40.0–52.0)
Hemoglobin: 11.1 g/dL — ABNORMAL LOW (ref 13.0–18.0)
MCH: 33.3 pg (ref 26.0–34.0)
MCHC: 35.8 g/dL (ref 32.0–36.0)
MCV: 93.2 fL (ref 80.0–100.0)
PLATELETS: 298 10*3/uL (ref 150–440)
RBC: 3.33 MIL/uL — AB (ref 4.40–5.90)
RDW: 12.1 % (ref 11.5–14.5)
WBC: 8.9 10*3/uL (ref 3.8–10.6)

## 2015-11-23 LAB — EXPECTORATED SPUTUM ASSESSMENT W GRAM STAIN, RFLX TO RESP C

## 2015-11-23 LAB — EXPECTORATED SPUTUM ASSESSMENT W REFEX TO RESP CULTURE

## 2015-11-23 MED ORDER — ALBUTEROL SULFATE HFA 108 (90 BASE) MCG/ACT IN AERS: 1.0000 | INHALATION_SPRAY | Freq: Four times a day (QID) | RESPIRATORY_TRACT | 2 refills | Status: DC | PRN

## 2015-11-23 MED ORDER — APIXABAN 5 MG PO TABS: 5.0000 mg | ORAL_TABLET | Freq: Two times a day (BID) | ORAL | 2 refills | Status: DC

## 2015-11-23 MED ORDER — APIXABAN 5 MG PO TABS
10.0000 mg | ORAL_TABLET | Freq: Two times a day (BID) | ORAL | Status: DC
  Administered 2015-11-23: 10 mg via ORAL
  Filled 2015-11-23: qty 2

## 2015-11-23 MED ORDER — AMOXICILLIN-POT CLAVULANATE 875-125 MG PO TABS: 1.0000 | ORAL_TABLET | Freq: Two times a day (BID) | ORAL | 0 refills | Status: DC

## 2015-11-23 MED ORDER — APIXABAN 5 MG PO TABS: 10.0000 mg | ORAL_TABLET | Freq: Two times a day (BID) | ORAL | 0 refills | Status: DC

## 2015-11-23 MED ORDER — APIXABAN 5 MG PO TABS: 5.0000 mg | ORAL_TABLET | Freq: Two times a day (BID) | ORAL | Status: DC

## 2015-11-23 MED ORDER — AMOXICILLIN-POT CLAVULANATE 875-125 MG PO TABS
1.0000 | ORAL_TABLET | Freq: Two times a day (BID) | ORAL | Status: DC
  Administered 2015-11-23: 1 via ORAL
  Filled 2015-11-23: qty 1

## 2015-11-23 NOTE — Progress Notes (Signed)
ANTICOAGULATION CONSULT NOTE  Pharmacy Consult for heparin  Indication: pulmonary embolus  Allergies  Allergen Reactions  . Sulfa Antibiotics Other (See Comments)    Patient Measurements: Height: 5\' 9"  (175.3 cm) Weight: 240 lb (108.9 kg) IBW/kg (Calculated) : 70.7 Heparin Dosing Weight: 94.5 kg  Vital Signs: Temp: 98.4 F (36.9 C) (08/28 0338) Temp Source: Oral (08/28 0338) BP: 130/74 (08/28 0338) Pulse Rate: 99 (08/28 0338)  Labs:  Recent Labs  11/21/15 0709 11/22/15 0621 11/23/15 0550  HGB 11.7* 12.8* 11.1*  HCT 33.5* 36.7* 31.0*  PLT 313 325 298  HEPARINUNFRC 0.36 0.47 0.40  CREATININE 0.76  --   --     Estimated Creatinine Clearance: 129.9 mL/min (by C-G formula based on SCr of 0.8 mg/dL).   Medical History: Past Medical History:  Diagnosis Date  . Pulmonary emboli (HCC)   . Pulmonary embolism (HCC) 2011    Medications:  Scheduled:  . docusate sodium  100 mg Oral BID  . piperacillin-tazobactam (ZOSYN)  IV  3.375 g Intravenous Q8H  . simvastatin  40 mg Oral Daily  . sodium chloride flush  3 mL Intravenous Q12H    Assessment: Pharmacy consulted for heparin drip management for 55 yo male admitted with PE. Patient currently ordered heparin 2500 units/hr.   Goal of Therapy:  Heparin level 0.3-0.7 units/ml Monitor platelets by anticoagulation protocol: Yes   Plan:  HL= 0.47.  Will continue current drip at 2500 units/hr and follow-up Heparin level with am labs.   Pharmacy will continue to monitor and adjust per consult.   8/28 AM heparin level 0.40. Continue current regimen. Heparin level and CBC with tomorrow AM labs.  Gavin Faivre S 11/23/2015,6:56 AM

## 2015-11-23 NOTE — Discharge Instructions (Signed)
Heart healthy diet. Activity as tolerated. Smoking cessation. eliquis 10 mg po bid for 7 days, then 5 mg bid po. Watch for bleeding.

## 2015-11-23 NOTE — Care Management (Signed)
$  10 copay card for Eliquis given. No other discharge needs noted

## 2015-11-23 NOTE — Progress Notes (Signed)
Patient is being discharge in a stable condition, summary and f/u care given to both pt's and wife , verbalized understanding . Left with wife

## 2015-11-23 NOTE — Discharge Summary (Signed)
[facility] - [place] at [facility]   PATIENT NAME: Frank Rivera    MR#:  [id number]  DATE OF BIRTH:  May 10, 1960  DATE OF ADMISSION:  11/17/2015   ADMITTING PHYSICIAN: Wyatt Haste, MD  DATE OF DISCHARGE: 11/23/2015 PRIMARY CARE PHYSICIAN: No PCP Per Patient   ADMISSION DIAGNOSIS:  Shortness of breath [R06.02] Tachycardia [R00.0] Acute respiratory failure with hypoxia (HCC) [J96.01] Abnormal CT scan, chest [R93.8] Other acute pulmonary embolism without acute cor pulmonale (HCC) [I26.99] DISCHARGE DIAGNOSIS:  Active Problems:   Acute pulmonary embolism (HCC)  SECONDARY DIAGNOSIS:   Past Medical History:  Diagnosis Date  . Pulmonary emboli (HCC)   . Pulmonary embolism (HCC) 2011   HOSPITAL COURSE:  #1. Acute respiratory failure with hypoxia due to pulmonary embolism and cavitating lesion, questionable lung abscess, he was on oxygen,  Off O2 today, SAT 98%.   Appreciated Pulm consult- Advised to continue same plan and follow in office.   Pt have massive PE with a large DVT clot, per vascular surgeon reevaluation, no indication of IVC filter this time.   #2. Pulmonary embolism, he has been treated wtih heparin, changed to eliquis this am.  #3 cavitating lesion of unclear etiology at this time, suspected pneumonia, abscess versus cancer, pulmonary is consulted, recommendations are reviewed. Isolated patient, negative AFBs, PPD, was on antibiotic therapy with vancomycin and Zosyn, sputum culture: ABUNDANT GRAM NEGATIVE RODS  ABUNDANT GRAM POSITIVE COCCI IN PAIRS, Appreciated help by ID. Vancomycin was discontinued, on zosyn only. Change to oral augmentin for 10 more days.  #4. Leukocytosis, improved. #5. Elevated troponin, likely demand ischemia, normal echocardiogram  #6 Left lower extremity swelling due to ankle fracture,  ultrasound of left lower extremity shows DVT,   Appreciated input of vascular surgery, as this is DVT and PE due to and accident and  sedentary event, no further intervention needed.   * tobacco abuse. Smoking cessation was counseled for 3-4 min.  DISCHARGE CONDITIONS:  Stable, discharge to home today. CONSULTS OBTAINED:  Treatment Team:  Wyatt Haste, MD Yevonne Pax, MD Mick Sell, MD DRUG ALLERGIES:   Allergies  Allergen Reactions  . Sulfa Antibiotics Other (See Comments)   DISCHARGE MEDICATIONS:     Medication List    STOP taking these medications   ibuprofen 200 MG tablet Commonly known as:  ADVIL,MOTRIN   oxyCODONE-acetaminophen 5-325 MG tablet Commonly known as:  PERCOCET     TAKE these medications   albuterol 108 (90 Base) MCG/ACT inhaler Commonly known as:  PROVENTIL HFA;VENTOLIN HFA Inhale 1-2 puffs into the lungs every 6 (six) hours as needed for wheezing or shortness of breath.   amoxicillin-clavulanate 875-125 MG tablet Commonly known as:  AUGMENTIN Take 1 tablet by mouth every 12 (twelve) hours.   apixaban 5 MG Tabs tablet Commonly known as:  ELIQUIS Take 2 tablets (10 mg total) by mouth 2 (two) times daily.   apixaban 5 MG Tabs tablet Commonly known as:  ELIQUIS Take 1 tablet (5 mg total) by mouth 2 (two) times daily. Start taking on:  11/30/2015   simvastatin 40 MG tablet Commonly known as:  ZOCOR Take 40 mg by mouth daily.        DISCHARGE INSTRUCTIONS:   DIET:  Heart healthy diet DISCHARGE CONDITION:  Stable, discharge to home today. ACTIVITY:  As tolerated DISCHARGE LOCATION:    If you experience worsening of your admission symptoms, develop shortness of breath, life threatening emergency, suicidal or homicidal thoughts you must  seek medical attention immediately by calling 911 or calling your MD immediately  if symptoms less severe.  You Must read complete instructions/literature along with all the possible adverse reactions/side effects for all the Medicines you take and that have been prescribed to you. Take any new Medicines after you have  completely understood and accpet all the possible adverse reactions/side effects.   Please note  You were cared for by a hospitalist during your hospital stay. If you have any questions about your discharge medications or the care you received while you were in the hospital after you are discharged, you can call the unit and asked to speak with the hospitalist on call if the hospitalist that took care of you is not available. Once you are discharged, your primary care physician will handle any further medical issues. Please note that NO REFILLS for any discharge medications will be authorized once you are discharged, as it is imperative that you return to your primary care physician (or establish a relationship with a primary care physician if you do not have one) for your aftercare needs so that they can reassess your need for medications and monitor your lab values.    On the day of Discharge:  VITAL SIGNS:  Blood pressure 126/61, pulse 86, temperature 97.6 F (36.4 C), resp. rate 19, height 5\' 9"  (1.753 m), weight 240 lb (108.9 kg), SpO2 93 %. PHYSICAL EXAMINATION:  GENERAL:  55 y.o.-year-old patient lying in the bed with no acute distress. Obese. EYES: Pupils equal, round, reactive to light and accommodation. No scleral icterus. Extraocular muscles intact.  HEENT: Head atraumatic, normocephalic. Oropharynx and nasopharynx clear.  NECK:  Supple, no jugular venous distention. No thyroid enlargement, no tenderness.  LUNGS: Normal breath sounds bilaterally, no wheezing, rales,rhonchi or crepitation. No use of accessory muscles of respiration.  CARDIOVASCULAR: S1, S2 normal. No murmurs, rubs, or gallops.  ABDOMEN: Soft, non-tender, non-distended. Bowel sounds present. No organomegaly or mass.  EXTREMITIES: No pedal edema, cyanosis, or clubbing.  NEUROLOGIC: Cranial nerves II through XII are intact. Muscle strength 5/5 in all extremities. Sensation intact. Gait not checked.  PSYCHIATRIC: The  patient is alert and oriented x 3.  SKIN: No obvious rash, lesion, or ulcer.  DATA REVIEW:   CBC  Recent Labs Lab 11/23/15 0550  WBC 8.9  HGB 11.1*  HCT 31.0*  PLT 298    Chemistries   Recent Labs Lab 11/18/15 1804  11/21/15 0709 11/22/15 0621  NA 132*  < > 135  --   K 4.2  < > 3.6  --   CL 100*  < > 97*  --   CO2 23  < > 29  --   GLUCOSE 107*  < > 122*  --   BUN 11  < > 14  --   CREATININE 0.78  < > 0.76  --   CALCIUM 9.0  < > 8.6*  --   MG  --   --   --  2.3  AST 17  --   --   --   ALT 27  --   --   --   ALKPHOS 90  --   --   --   BILITOT 1.7*  --   --   --   < > = values in this interval not displayed.   Microbiology Results  Results for orders placed or performed during the hospital encounter of 11/17/15  MRSA PCR Screening     Status: None  Collection Time: 11/18/15  1:31 PM  Result Value Ref Range Status   MRSA by PCR NEGATIVE NEGATIVE Final    Comment:        The GeneXpert MRSA Assay (FDA approved for NASAL specimens only), is one component of a comprehensive MRSA colonization surveillance program. It is not intended to diagnose MRSA infection nor to guide or monitor treatment for MRSA infections.   Culture, expectorated sputum-assessment     Status: None   Collection Time: 11/19/15  8:53 AM  Result Value Ref Range Status   Specimen Description EXPECTORATED SPUTUM  Final   Special Requests EXPECTORATED SPUTUM  Final   Sputum evaluation   Final    Sputum specimen not acceptable for testing.  Please recollect.   NOTIFIED SUSAN PRESTON ON 11/19/15 AT 1140 FOR RECOLLECT QSD    Report Status 11/23/2015 FINAL  Final  Acid Fast Smear (AFB)     Status: None   Collection Time: 11/19/15  8:53 AM  Result Value Ref Range Status   AFB Specimen Processing Concentration  Final   Acid Fast Smear Negative  Final    Comment: (NOTE) Performed At: [facility] [address] [place], [place] [id number] Mila Homer MD WU:[id number]    Source  (AFB) EXPECTORATED SPUTUM  Final  CULTURE, BLOOD (ROUTINE X 2) w Reflex to ID Panel     Status: None (Preliminary result)   Collection Time: 11/19/15  1:30 PM  Result Value Ref Range Status   Specimen Description BLOOD  LEFT HAND   Final   Special Requests   Final    BOTTLES DRAWN AEROBIC AND ANAEROBIC  ANA AER   Culture NO GROWTH 4 DAYS  Final   Report Status PENDING  Incomplete  CULTURE, BLOOD (ROUTINE X 2) w Reflex to ID Panel     Status: None (Preliminary result)   Collection Time: 11/19/15  1:31 PM  Result Value Ref Range Status   Specimen Description BLOOD  LEFT HAND  Final   Special Requests   Final    BOTTLES DRAWN AEROBIC AND ANAEROBIC  ANA AER   Culture NO GROWTH 4 DAYS  Final   Report Status PENDING  Incomplete  Culture, expectorated sputum-assessment     Status: None   Collection Time: 11/19/15  2:30 PM  Result Value Ref Range Status   Specimen Description INDUCED SPUTUM  Final   Special Requests Normal  Final   Sputum evaluation THIS SPECIMEN IS ACCEPTABLE FOR SPUTUM CULTURE  Final   Report Status 11/19/2015 FINAL  Final  Culture, respiratory (NON-Expectorated)     Status: None   Collection Time: 11/19/15  2:30 PM  Result Value Ref Range Status   Specimen Description INDUCED SPUTUM  Final   Special Requests Normal Reflexed from [id number]  Final   Gram Stain   Final    FEW WBC PRESENT, PREDOMINANTLY PMN MODERATE SQUAMOUS EPITHELIAL CELLS PRESENT ABUNDANT GRAM NEGATIVE RODS ABUNDANT GRAM POSITIVE COCCI IN PAIRS FEW GRAM POSITIVE RODS RARE YEAST    Culture   Final    Consistent with normal respiratory flora. Performed at [facility]    Report Status 11/22/2015 FINAL  Final  Acid Fast Smear (AFB)     Status: None   Collection Time: 11/20/15 10:15 AM  Result Value Ref Range Status   AFB Specimen Processing Concentration  Final   Acid Fast Smear Negative  Final    Comment: (NOTE) Performed At: [facility] [address]  [place], [place] [id number] Mila HomerHancock William F MD NW:[id number]:[phone]    Source (AFB) EXPECTORATED SPUTUM  Final    RADIOLOGY:  No results found.   Management plans discussed with the patient, family and they are in agreement.  CODE STATUS:     Code Status Orders        Start     Ordered   11/17/15 1907  Full code  Continuous     11/17/15 1907    Code Status History    Date Active Date Inactive Code Status Order ID Comments User Context   11/17/2015  7:07 PM 11/18/2015 12:49 PM Full Code [id number]  Wyatt Hasteavid K Hower, MD ED      TOTAL TIME TAKING CARE OF THIS PATIENT: 33 minutes.    Shaune Pollackhen, Tyheem Boughner M.D on 11/23/2015 at 1:41 PM  Between 7am to 6pm - Pager - [phone]  After 6pm go to [email or web address] - Scientist, research (life sciences)password EPAS [facility]  [facility] [place] [facility]  Office  [phone]  CC: Primary care physician; No PCP Per Patient   Note: This dictation was prepared with Dragon dictation along with smaller phrase technology. Any transcriptional errors that result from this process are unintentional.

## 2015-11-24 LAB — CULTURE, BLOOD (ROUTINE X 2)
CULTURE: NO GROWTH
Culture: NO GROWTH

## 2015-11-24 LAB — ACID FAST SMEAR (AFB): ACID FAST SMEAR - AFSCU2: NEGATIVE

## 2015-11-24 LAB — ACID FAST SMEAR (AFB, MYCOBACTERIA)

## 2015-11-27 DIAGNOSIS — M25472 Effusion, left ankle: Secondary | ICD-10-CM | POA: Insufficient documentation

## 2015-12-01 ENCOUNTER — Other Ambulatory Visit: Payer: Self-pay | Admitting: Internal Medicine

## 2015-12-01 DIAGNOSIS — J181 Lobar pneumonia, unspecified organism: Secondary | ICD-10-CM

## 2015-12-04 ENCOUNTER — Ambulatory Visit
Admission: RE | Admit: 2015-12-04 | Discharge: 2015-12-04 | Disposition: A | Discharge status: Home / Self Care | Payer: BLUE CROSS/BLUE SHIELD | Source: Ambulatory Visit | Attending: Internal Medicine | Admitting: Internal Medicine

## 2015-12-04 DIAGNOSIS — J181 Lobar pneumonia, unspecified organism: Secondary | ICD-10-CM | POA: Diagnosis present

## 2015-12-04 DIAGNOSIS — R918 Other nonspecific abnormal finding of lung field: Secondary | ICD-10-CM | POA: Insufficient documentation

## 2015-12-04 DIAGNOSIS — I2510 Atherosclerotic heart disease of native coronary artery without angina pectoris: Secondary | ICD-10-CM | POA: Insufficient documentation

## 2015-12-04 DIAGNOSIS — I2699 Other pulmonary embolism without acute cor pulmonale: Secondary | ICD-10-CM | POA: Insufficient documentation

## 2015-12-04 DIAGNOSIS — I700 Atherosclerosis of aorta: Secondary | ICD-10-CM | POA: Insufficient documentation

## 2015-12-04 MED ORDER — IOHEXOL 350 MG/ML SOLN
75.0000 mL | Freq: Once | INTRAVENOUS | Status: AC | PRN
  Administered 2015-12-04: 75 mL via INTRAVENOUS

## 2015-12-25 LAB — RAPID GROWER BROTH SUSCEP.

## 2015-12-25 LAB — ORG ID BY SEQUENCING RFLX AST

## 2015-12-25 LAB — AFB ORGANISM ID BY DNA PROBE
M avium complex: NEGATIVE
M tuberculosis complex: NEGATIVE

## 2015-12-25 LAB — ACID FAST CULTURE WITH REFLEXED SENSITIVITIES (MYCOBACTERIA): Acid Fast Culture: POSITIVE — AB

## 2016-01-01 LAB — ACID FAST CULTURE WITH REFLEXED SENSITIVITIES

## 2016-01-01 LAB — ACID FAST CULTURE WITH REFLEXED SENSITIVITIES (MYCOBACTERIA): Acid Fast Culture: NEGATIVE

## 2016-01-04 ENCOUNTER — Inpatient Hospital Stay: Payer: BLUE CROSS/BLUE SHIELD | Attending: Internal Medicine | Admitting: Internal Medicine

## 2016-01-04 ENCOUNTER — Inpatient Hospital Stay: Payer: BLUE CROSS/BLUE SHIELD

## 2016-01-04 ENCOUNTER — Encounter: Payer: Self-pay | Admitting: Internal Medicine

## 2016-01-04 ENCOUNTER — Other Ambulatory Visit: Payer: Self-pay

## 2016-01-04 ENCOUNTER — Encounter (INDEPENDENT_AMBULATORY_CARE_PROVIDER_SITE_OTHER): Payer: Self-pay

## 2016-01-04 DIAGNOSIS — R911 Solitary pulmonary nodule: Secondary | ICD-10-CM | POA: Insufficient documentation

## 2016-01-04 DIAGNOSIS — I2699 Other pulmonary embolism without acute cor pulmonale: Secondary | ICD-10-CM | POA: Diagnosis not present

## 2016-01-04 DIAGNOSIS — Z87891 Personal history of nicotine dependence: Secondary | ICD-10-CM

## 2016-01-04 DIAGNOSIS — Z7901 Long term (current) use of anticoagulants: Secondary | ICD-10-CM | POA: Diagnosis not present

## 2016-01-04 DIAGNOSIS — D6859 Other primary thrombophilia: Secondary | ICD-10-CM

## 2016-01-04 LAB — ACID FAST CULTURE WITH REFLEXED SENSITIVITIES (MYCOBACTERIA)

## 2016-01-04 LAB — ACID FAST CULTURE WITH REFLEXED SENSITIVITIES: ACID FAST CULTURE - AFSCU3: NEGATIVE

## 2016-01-04 NOTE — Assessment & Plan Note (Signed)
#   Bilateral pulmonary embolism 2; question provoked. Recommend checking genetic causes of hypercoagulable state. Given the extensive DVT/PE I suspect underlying hypercoagulable state. Check factor V Leiden prothrombin gene mutation.   # Patient has 2 PEs- any given recent extensive PE- I think patient would be a candidate for long-term/indefinite anticoagulation. Await above workup   #Regarding the type of anticoagulation- patient is currently on Coumadin; if having difficulty in maintaining the INR therapeutic- I recommend Xarelto as the next option. We discussed with Dr. Welton FlakesKhan  # Lung lesions- question related to pulmonary infarction. Defer to pulmonary for further workup/recommendations  # Left lower extremity post thrombophlebitic syndrome [Dr.Dew] patient recommended compression stockings   Thank you Dr. Cephus SlaterKhanfor allowing me to participate in the care of your pleasant patient. Please do not hesitate to contact me with questions or concerns in the interim.    # 45 minutes face-to-face with the patient discussing the above plan of care; more than 50% of time spent on prognosis/ natural history; counseling and coordination.

## 2016-01-04 NOTE — Progress Notes (Signed)
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  # Lung lesions- infarction; less likely malignancy [Dr.Khan]  # SEP 2001- PE [ x 6 coumadin; [place]] ? flight to [place]; 2002- varicose vein stripping]   No history exists.     HISTORY OF PRESENTING ILLNESS:  Frank Rivera 55 y.o.  male above history of DVT/PE has been currently referred to [name] for further evaluation of hypercoagulable state/recommendations.  Patient states that he had a mechanical fall and fractured his left ankle early August 2017; patient was wheelchair/crutches bound- he had boot/ no cast. Few weeks later he developed sudden onset of neck and chest pain that led to hospitalization/CT scan showed bilateral significant PE; and also lung lesions bilaterally suggestive of infarctions. Patient was discharged home on Eliquis. However. Weeks later patient had a repeat CT scan- to evaluate the lung lesions- which was more suggestive of infarction; however showed left lung emboli. Patient was switched over to Coumadin. Patient does not complain of any significant change in his clinical status when anticoagulation  was switched.   Most recently he complains of Difficulty maintaining the Coumadin levels.; Is currently being monitored by pulmonary for his INR monitoring.   ROS: A complete 10 point review of system is done which is negative except mentioned above in history of present illness  MEDICAL HISTORY:  Past Medical History:  Diagnosis Date  . Dyspnea on exertion   . Pulmonary emboli (HCC)   . Pulmonary embolism (HCC) 2011    SURGICAL HISTORY: Past Surgical History:  Procedure Laterality Date   . APPENDECTOMY     as child  . TONSILLECTOMY AND ADENOIDECTOMY     as child    SOCIAL HISTORY: dispatcher for trucking/ desk job; quit smoking aug 2017; lives with family. Cheree Ditto.  Social History   Social History  . Marital status: Married    Spouse name: N/A  . Number of children: N/A  . Years of education: N/A   Occupational History  . Not on file.   Social History Main Topics  . Smoking status: Former Smoker    Packs/day: 1.00    Years: 25.00    Types: Cigarettes    Quit date: 11/17/2015  . Smokeless tobacco: Never Used  . Alcohol use No  . Drug use: No  . Sexual activity: Yes    Partners: Female   Other Topics Concern  . Not on file   Social History Narrative  . No narrative on file    FAMILY HISTORY: 10 boy; 11 y girl.  Family History  Problem Relation Age of Onset  . Melanoma Father     face  . Colon cancer Paternal Uncle     died of colon ca - dx at age 22's  . Diabetes Neg Hx     ALLERGIES:  is allergic to sulfa antibiotics.  MEDICATIONS:  Current Outpatient Prescriptions  Medication Sig Dispense Refill  . acetaminophen (TYLENOL) 650 MG CR tablet Take 1,300 mg by mouth every morning.     . diphenhydramine-acetaminophen (TYLENOL PM) 25-500 MG TABS tablet Take 2 tablets by mouth at bedtime as needed.    . furosemide (LASIX) 20 MG tablet Take 20 mg by mouth  daily.  0  . oxyCODONE-acetaminophen (PERCOCET/ROXICET) 5-325 MG tablet Take 0.5 tablets by mouth daily.  0  . potassium chloride (K-DUR,KLOR-CON) 10 MEQ tablet Take 1 tablet by mouth daily.  0  . simvastatin (ZOCOR) 40 MG tablet Take 40 mg by mouth daily.    Marland Kitchen warfarin (COUMADIN) 5 MG tablet Take 0.5 tablets by mouth daily.  0  . albuterol (PROVENTIL HFA;VENTOLIN HFA) 108 (90 Base) MCG/ACT inhaler Inhale 1-2 puffs into the lungs every 6 (six) hours as needed for wheezing or shortness of breath. (Patient not taking: Reported on 01/04/2016) 1 Inhaler 2   No current facility-administered medications  for this visit.       Marland Kitchen  PHYSICAL EXAMINATION: ECOG PERFORMANCE STATUS: 0 - Asymptomatic  Vitals:   01/04/16 1348  BP: (!) 143/85  Pulse: 88  Resp: 20  Temp: 97.6 F (36.4 C)   Filed Weights   01/04/16 1348  Weight: 241 lb (109.3 kg)    GENERAL: Well-nourished well-developed; Alert, no distress and comfortable.   Alone.  EYES: no pallor or icterus OROPHARYNX: no thrush or ulceration; good dentition  NECK: supple, no masses felt LYMPH:  no palpable lymphadenopathy in the cervical, axillary or inguinal regions LUNGS: clear to auscultation and  No wheeze or crackles HEART/CVS: regular rate & rhythm and no murmurs;Left lower extremity swollen when compared to the right.  ABDOMEN: abdomen soft, non-tender and normal bowel sounds Musculoskeletal:no cyanosis of digits and no clubbing  PSYCH: alert & oriented x 3 with fluent speech NEURO: no focal motor/sensory deficits SKIN:  no rashes or significant lesions  LABORATORY DATA:  I have reviewed the data as listed Lab Results  Component Value Date   WBC 8.9 11/23/2015   HGB 11.1 (L) 11/23/2015   HCT 31.0 (L) 11/23/2015   MCV 93.2 11/23/2015   PLT 298 11/23/2015    Recent Labs  11/18/15 1804 11/20/15 0334 11/21/15 0709  NA 132* 134* 135  K 4.2 4.0 3.6  CL 100* 99* 97*  CO2 23 25 29   GLUCOSE 107* 121* 122*  BUN 11 13 14   CREATININE 0.78 0.69 0.76  CALCIUM 9.0 8.7* 8.6*  GFRNONAA >60 >60 >60  GFRAA >60 >60 >60  PROT 7.4  --   --   ALBUMIN 3.5  --   --   AST 17  --   --   ALT 27  --   --   ALKPHOS 90  --   --   BILITOT 1.7*  --   --     RADIOGRAPHIC STUDIES: I have personally reviewed the radiological images as listed and agreed with the findings in the report. No results found.  ASSESSMENT & PLAN:   Pulmonary embolism, bilateral (HCC) # Bilateral pulmonary embolism 2; question provoked. Recommend checking genetic causes of hypercoagulable state. Given the extensive DVT/PE I suspect underlying  hypercoagulable state. Check factor V Leiden prothrombin gene mutation.   # Patient has 2 PEs- any given recent extensive PE- I think patient would be a candidate for long-term/indefinite anticoagulation. Await above workup   #Regarding the type of anticoagulation- patient is currently on Coumadin; if having difficulty in maintaining the INR therapeutic- I recommend Xarelto as the next option. We discussed with Dr. Welton Flakes  # Lung lesions- question related to pulmonary infarction. Defer to pulmonary for further workup/recommendations  # Left lower extremity post thrombophlebitic syndrome [Dr.Dew] patient recommended compression stockings   Thank you Dr. Cephus Slater allowing me to participate in the care of your pleasant  patient. Please do not hesitate to contact me with questions or concerns in the interim.    # 45 minutes face-to-face with the patient discussing the above plan of care; more than 50% of time spent on prognosis/ natural history; counseling and coordination.   All questions were answered. The patient knows to call the clinic with any problems, questions or concerns.    Earna CoderGovinda R Arjan Strohm, MD 01/04/2016 6:45 PM

## 2016-01-07 LAB — FACTOR 5 LEIDEN

## 2016-01-08 LAB — PROTHROMBIN GENE MUTATION

## 2016-02-04 ENCOUNTER — Inpatient Hospital Stay: Payer: BLUE CROSS/BLUE SHIELD | Attending: Internal Medicine | Admitting: Internal Medicine

## 2016-02-04 VITALS — BP 146/86 | HR 101 | Temp 96.9°F | Resp 18 | Wt 252.0 lb

## 2016-02-04 DIAGNOSIS — Z86711 Personal history of pulmonary embolism: Secondary | ICD-10-CM

## 2016-02-04 DIAGNOSIS — Z87891 Personal history of nicotine dependence: Secondary | ICD-10-CM | POA: Insufficient documentation

## 2016-02-04 DIAGNOSIS — M79672 Pain in left foot: Secondary | ICD-10-CM | POA: Diagnosis not present

## 2016-02-04 DIAGNOSIS — Z86718 Personal history of other venous thrombosis and embolism: Secondary | ICD-10-CM

## 2016-02-04 DIAGNOSIS — Z7901 Long term (current) use of anticoagulants: Secondary | ICD-10-CM | POA: Insufficient documentation

## 2016-02-04 DIAGNOSIS — I2699 Other pulmonary embolism without acute cor pulmonale: Secondary | ICD-10-CM

## 2016-02-04 MED ORDER — OXYCODONE-ACETAMINOPHEN 5-325 MG PO TABS: 0.5000 | ORAL_TABLET | Freq: Every day | ORAL | 0 refills | Status: DC

## 2016-02-04 NOTE — Assessment & Plan Note (Signed)
#   Bilateral pulmonary embolism 2; question provoked. Prothombin / Factor V leiden- NEG.   # Patient has 2 PEs-  given recent extensive PE- I think patient would be a candidate for long-term/indefinite anticoagulation; coumadin as per Dr.Khan.  Discussed re: at least 1 year anti-coagulation; and then lower dose of eliquis.   # Left foot pain- ? Bone etiology. Recommend follow up PCP.    # Lung lesions- question related to pulmonary infarction. Defer to pulmonary for further workup/recommendations  # Left lower extremity post thrombophlebitic syndrome [Dr.Dew] patient recommended compression stockings. Given prescription for oxycodone half a pill once a day as needed; follow up with PCP for further prescriptions.  # follow up 6 months/ no labs.

## 2016-02-04 NOTE — Progress Notes (Signed)
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  # Lung lesions- infarction; less likely malignancy [Dr.Khan]  # SEP 2001- PE [ x 6 coumadin; [place]] ? flight to [place]; 2002- varicose vein stripping]   No history exists.     HISTORY OF PRESENTING ILLNESS:  Frank Rivera 55 y.o.  male above history of DVT/PE- Most recently in August 2017- currently on Coumadin is here for follow-up/to review the results of his blood work.  Patient states that he notes to have pain in his foot while going down the stairs.; His chronic swelling of the left lower extremity not any worse. Denies any pain in the leg. Coumadin Is currently being monitored by pulmonary/PCP.   ROS: A complete 10 point review of system is done which is negative except mentioned above in history of present illness  MEDICAL HISTORY:  Past Medical History:  Diagnosis Date  . Dyspnea on exertion   . Pulmonary emboli (HCC)   . Pulmonary embolism (HCC) 2011    SURGICAL HISTORY: Past Surgical History:  Procedure Laterality Date  . APPENDECTOMY     as child  . TONSILLECTOMY AND ADENOIDECTOMY     as child    SOCIAL HISTORY: dispatcher for trucking/ desk job; quit smoking aug 2017; lives with family. Cheree DittoGraham.  Social History   Social History  . Marital status: Married    Spouse name: N/A  . Number of children: N/A  . Years of education: N/A   Occupational History  . Not on file.   Social History Main Topics  . Smoking status: Former Smoker    Packs/day: 1.00    Years: 25.00    Types: Cigarettes    Quit date: 11/17/2015  . Smokeless tobacco: Never Used  . Alcohol use No  . Drug use: No   . Sexual activity: Yes    Partners: Female   Other Topics Concern  . Not on file   Social History Narrative  . No narrative on file    FAMILY HISTORY: 5421 boy; 7717 y girl.  Family History  Problem Relation Age of Onset  . Melanoma Father     face  . Colon cancer Paternal Uncle     died of colon ca - dx at age 53's  . Diabetes Neg Hx     ALLERGIES:  is allergic to sulfa antibiotics.  MEDICATIONS:  Current Outpatient Prescriptions  Medication Sig Dispense Refill  . acetaminophen (TYLENOL) 650 MG CR tablet Take 1,300 mg by mouth every morning.     Marland Kitchen. albuterol (PROVENTIL HFA;VENTOLIN HFA) 108 (90 Base) MCG/ACT inhaler Inhale 1-2 puffs into the lungs every 6 (six) hours as needed for wheezing or shortness of breath. 1 Inhaler 2  . diphenhydramine-acetaminophen (TYLENOL PM) 25-500 MG TABS tablet Take 2 tablets by mouth at bedtime as needed.    . traMADol (ULTRAM) 50 MG tablet take 1 tablet by mouth every 6 hours if needed for pain  0  . warfarin (COUMADIN) 5 MG tablet Take 0.5 tablets by mouth daily.  0  . furosemide (LASIX) 20 MG tablet Take 20 mg by mouth daily.  0  . oxyCODONE-acetaminophen (PERCOCET/ROXICET) 5-325 MG tablet Take 0.5 tablets by  mouth daily. 30 tablet 0  . potassium chloride (K-DUR,KLOR-CON) 10 MEQ tablet Take 1 tablet by mouth daily.  0  . simvastatin (ZOCOR) 40 MG tablet Take 40 mg by mouth daily. Takes 1.5 tablets in the evening     No current facility-administered medications for this visit.       Marland Kitchen.  PHYSICAL EXAMINATION: ECOG PERFORMANCE STATUS: 0 - Asymptomatic  Vitals:   02/04/16 1452  BP: (!) 146/86  Pulse: (!) 101  Resp: 18  Temp: (!) 96.9 F (36.1 C)   Filed Weights   02/04/16 1452  Weight: 252 lb (114.3 kg)    GENERAL: Well-nourished well-developed; Alert, no distress and comfortable.   Alone.  EYES: no pallor or icterus OROPHARYNX: no thrush or ulceration; good dentition  NECK: supple, no masses felt LYMPH:  no palpable  lymphadenopathy in the cervical, axillary or inguinal regions LUNGS: clear to auscultation and  No wheeze or crackles HEART/CVS: regular rate & rhythm and no murmurs;Left lower extremity swollen when compared to the right.  ABDOMEN: abdomen soft, non-tender and normal bowel sounds Musculoskeletal:no cyanosis of digits and no clubbing  PSYCH: alert & oriented x 3 with fluent speech NEURO: no focal motor/sensory deficits SKIN:  no rashes or significant lesions  LABORATORY DATA:  I have reviewed the data as listed Lab Results  Component Value Date   WBC 8.9 11/23/2015   HGB 11.1 (L) 11/23/2015   HCT 31.0 (L) 11/23/2015   MCV 93.2 11/23/2015   PLT 298 11/23/2015    Recent Labs  11/18/15 1804 11/20/15 0334 11/21/15 0709  NA 132* 134* 135  K 4.2 4.0 3.6  CL 100* 99* 97*  CO2 23 25 29   GLUCOSE 107* 121* 122*  BUN 11 13 14   CREATININE 0.78 0.69 0.76  CALCIUM 9.0 8.7* 8.6*  GFRNONAA >60 >60 >60  GFRAA >60 >60 >60  PROT 7.4  --   --   ALBUMIN 3.5  --   --   AST 17  --   --   ALT 27  --   --   ALKPHOS 90  --   --   BILITOT 1.7*  --   --     RADIOGRAPHIC STUDIES: I have personally reviewed the radiological images as listed and agreed with the findings in the report. No results found.  ASSESSMENT & PLAN:   Pulmonary embolism, bilateral (HCC) # Bilateral pulmonary embolism 2; question provoked. Prothombin / Factor V leiden- NEG.   # Patient has 2 PEs-  given recent extensive PE- I think patient would be a candidate for long-term/indefinite anticoagulation; coumadin as per Dr.Khan.  Discussed re: at least 1 year anti-coagulation; and then lower dose of eliquis.   # Left foot pain- ? Bone etiology. Recommend follow up PCP.    # Lung lesions- question related to pulmonary infarction. Defer to pulmonary for further workup/recommendations  # Left lower extremity post thrombophlebitic syndrome [Dr.Dew] patient recommended compression stockings. Given prescription for oxycodone  half a pill once a day as needed; follow up with PCP for further prescriptions.  # follow up 6 months/ no labs.   All questions were answered. The patient knows to call the clinic with any problems, questions or concerns.    Earna CoderGovinda R Robbyn Hodkinson, MD 02/04/2016 5:03 PM

## 2016-02-04 NOTE — Progress Notes (Signed)
Patient is here for follow up,  

## 2016-02-15 ENCOUNTER — Other Ambulatory Visit: Payer: Self-pay | Admitting: Internal Medicine

## 2016-02-15 DIAGNOSIS — I2699 Other pulmonary embolism without acute cor pulmonale: Secondary | ICD-10-CM

## 2016-02-25 ENCOUNTER — Ambulatory Visit
Admission: RE | Admit: 2016-02-25 | Discharge: 2016-02-25 | Disposition: A | Discharge status: Home / Self Care | Payer: BLUE CROSS/BLUE SHIELD | Source: Ambulatory Visit | Attending: Internal Medicine | Admitting: Internal Medicine

## 2016-02-25 DIAGNOSIS — J9811 Atelectasis: Secondary | ICD-10-CM | POA: Diagnosis not present

## 2016-02-25 DIAGNOSIS — I2699 Other pulmonary embolism without acute cor pulmonale: Secondary | ICD-10-CM | POA: Diagnosis present

## 2016-02-25 MED ORDER — IOPAMIDOL (ISOVUE-370) INJECTION 76%
75.0000 mL | Freq: Once | INTRAVENOUS | Status: AC | PRN
  Administered 2016-02-25: 75 mL via INTRAVENOUS

## 2016-04-28 DIAGNOSIS — M25471 Effusion, right ankle: Secondary | ICD-10-CM | POA: Insufficient documentation

## 2016-05-30 ENCOUNTER — Other Ambulatory Visit: Payer: Self-pay | Admitting: Internal Medicine

## 2016-05-30 DIAGNOSIS — I2699 Other pulmonary embolism without acute cor pulmonale: Secondary | ICD-10-CM

## 2016-05-30 DIAGNOSIS — T81718D Complication of other artery following a procedure, not elsewhere classified, subsequent encounter: Principal | ICD-10-CM

## 2016-06-14 ENCOUNTER — Ambulatory Visit: Payer: BLUE CROSS/BLUE SHIELD

## 2016-06-15 ENCOUNTER — Ambulatory Visit
Admission: RE | Admit: 2016-06-15 | Discharge: 2016-06-15 | Disposition: A | Discharge status: Home / Self Care | Payer: BLUE CROSS/BLUE SHIELD | Source: Ambulatory Visit | Attending: Internal Medicine | Admitting: Internal Medicine

## 2016-06-15 DIAGNOSIS — K760 Fatty (change of) liver, not elsewhere classified: Secondary | ICD-10-CM | POA: Insufficient documentation

## 2016-06-15 DIAGNOSIS — Z86711 Personal history of pulmonary embolism: Secondary | ICD-10-CM | POA: Insufficient documentation

## 2016-06-15 DIAGNOSIS — T81718D Complication of other artery following a procedure, not elsewhere classified, subsequent encounter: Secondary | ICD-10-CM

## 2016-06-15 DIAGNOSIS — J984 Other disorders of lung: Secondary | ICD-10-CM | POA: Insufficient documentation

## 2016-06-15 DIAGNOSIS — I2699 Other pulmonary embolism without acute cor pulmonale: Secondary | ICD-10-CM

## 2016-06-15 MED ORDER — IOPAMIDOL (ISOVUE-370) INJECTION 76%
75.0000 mL | Freq: Once | INTRAVENOUS | Status: AC | PRN
  Administered 2016-06-15: 75 mL via INTRAVENOUS

## 2016-06-17 ENCOUNTER — Encounter (INDEPENDENT_AMBULATORY_CARE_PROVIDER_SITE_OTHER): Payer: Self-pay | Admitting: Vascular Surgery

## 2016-06-17 ENCOUNTER — Ambulatory Visit (INDEPENDENT_AMBULATORY_CARE_PROVIDER_SITE_OTHER): Payer: Self-pay | Admitting: Vascular Surgery

## 2016-06-17 VITALS — BP 181/105 | HR 71 | Resp 17 | Ht 69.0 in | Wt 268.0 lb

## 2016-06-17 DIAGNOSIS — I2699 Other pulmonary embolism without acute cor pulmonale: Secondary | ICD-10-CM

## 2016-06-17 DIAGNOSIS — I87009 Postthrombotic syndrome without complications of unspecified extremity: Secondary | ICD-10-CM

## 2016-06-17 DIAGNOSIS — D6859 Other primary thrombophilia: Secondary | ICD-10-CM

## 2016-06-17 NOTE — Assessment & Plan Note (Signed)
Recent CT scan demonstrates resolution of the previous pulmonary emboli.

## 2016-06-17 NOTE — Assessment & Plan Note (Signed)
The patient has mild to moderate postphlebitic symptoms. When he wears his compression stockings regularly, these are reasonably well controlled. He does not have ulceration or infection. I would continue to recommend increased activity, compression stockings, elevation, and he will remain on his anticoagulation for at least 1 year. I have offered him a six-month her 1 year follow-up or to call me if his symptoms worsen. He will call us back as needed.

## 2016-06-17 NOTE — Progress Notes (Signed)
MRN : [id number]  Frank Rivera is a 56 y.o. (03/12/1961) male who presents with chief complaint of  Chief Complaint  Patient presents with  . Re-evaluation    Follow up  .  History of Present Illness: Patient returns today in follow up of DVT and postphlebitic syndrome. Overall, his legs are doing reasonably well. When he wears his compression stockings, his swelling is basically gone. If he does not, he does have more heaviness and pain in the leg as well as swelling. He had a recent CT angiogram of the chest which I have independently reviewed. Resolution of his pulmonary emboli are seen with no new pulmonary emboli. He remains on anticoagulation.  Current Outpatient Prescriptions  Medication Sig Dispense Refill  . acetaminophen (TYLENOL) 650 MG CR tablet Take 1,300 mg by mouth every morning.     Marland Kitchen. albuterol (PROVENTIL HFA;VENTOLIN HFA) 108 (90 Base) MCG/ACT inhaler Inhale 1-2 puffs into the lungs every 6 (six) hours as needed for wheezing or shortness of breath. 1 Inhaler 2  . cephALEXin (KEFLEX) 500 MG capsule take 1 capsule by mouth every 12 hours if needed  0  . diphenhydramine-acetaminophen (TYLENOL PM) 25-500 MG TABS tablet Take 2 tablets by mouth at bedtime as needed.    . furosemide (LASIX) 20 MG tablet Take 20 mg by mouth daily.  0  . oxyCODONE-acetaminophen (PERCOCET/ROXICET) 5-325 MG tablet Take 0.5 tablets by mouth daily. 30 tablet 0  . potassium chloride (K-DUR,KLOR-CON) 10 MEQ tablet Take 1 tablet by mouth daily.  0  . simvastatin (ZOCOR) 40 MG tablet Take 40 mg by mouth daily. Takes 1.5 tablets in the evening    . traMADol (ULTRAM) 50 MG tablet take 1 tablet by mouth every 6 hours if needed for pain  0  . warfarin (COUMADIN) 5 MG tablet Take 0.5 tablets by mouth daily.  0   No current facility-administered medications for this visit.     Past Medical History:  Diagnosis Date  . Dyspnea on exertion   . Pulmonary emboli (HCC)   . Pulmonary embolism (HCC) 2011     Past Surgical History:  Procedure Laterality Date  . APPENDECTOMY     as child  . TONSILLECTOMY AND ADENOIDECTOMY     as child    Social History Social History  Substance Use Topics  . Smoking status: Former Smoker    Packs/day: 1.00    Years: 25.00    Types: Cigarettes    Quit date: 11/17/2015  . Smokeless tobacco: Never Used  . Alcohol use No    Family History Family History  Problem Relation Age of Onset  . Melanoma Father     face  . Colon cancer Paternal Uncle     died of colon ca - dx at age 54's  . Diabetes Neg Hx     Allergies  Allergen Reactions  . Sulfa Antibiotics Other (See Comments)    As a child-told he had reaction to sulfa drugs.     REVIEW OF SYSTEMS (Negative unless checked)  Constitutional: [] Weight loss  [] Fever  [] Chills Cardiac: [] Chest pain   [] Chest pressure   [] Palpitations   [] Shortness of breath when laying flat   [] Shortness of breath at rest   [] Shortness of breath with exertion. Vascular:  [] Pain in legs with walking   [] Pain in legs at rest   [] Pain in legs when laying flat   [] Claudication   [] Pain in feet when walking  [] Pain in feet at rest  []   Pain in feet when laying flat   [x] History of DVT   [] Phlebitis   [x] Swelling in legs   [] Varicose veins   [] Non-healing ulcers Pulmonary:   [] Uses home oxygen   [] Productive cough   [] Hemoptysis   [] Wheeze  [] COPD   [] Asthma Neurologic:  [] Dizziness  [] Blackouts   [] Seizures   [] History of stroke   [] History of TIA  [] Aphasia   [] Temporary blindness   [] Dysphagia   [] Weakness or numbness in arms   [] Weakness or numbness in legs Musculoskeletal:  [] Arthritis   [] Joint swelling   [] Joint pain   [] Low back pain Hematologic:  [] Easy bruising  [] Easy bleeding   [] Hypercoagulable state   [] Anemic   Gastrointestinal:  [] Blood in stool   [] Vomiting blood  [] Gastroesophageal reflux/heartburn   [] Abdominal pain Genitourinary:  [] Chronic kidney disease   [] Difficult urination  [] Frequent urination   [] Burning with urination   [] Hematuria Skin:  [] Rashes   [] Ulcers   [] Wounds Psychological:  [] History of anxiety   []  History of major depression.  Physical Examination  BP (!) 181/105 (BP Location: Right Arm)   Pulse 71   Resp 17   Ht 5\' 9"  (1.753 m)   Wt 268 lb (121.6 kg)   BMI 39.58 kg/m  Gen:  WD/WN, NAD Head: [place]/AT, No temporalis wasting. Ear/Nose/Throat: Hearing grossly intact, nares w/o erythema or drainage, trachea midline Eyes: Conjunctiva clear. Sclera non-icteric Neck: Supple.  No JVD.  Pulmonary:  Good air movement, no use of accessory muscles.  Cardiac: RRR, normal S1, S2 Vascular:  Vessel Right Left  Radial Palpable Palpable  Ulnar Palpable Palpable  Brachial Palpable Palpable  Carotid Palpable, without bruit Palpable, without bruit  Aorta Not palpable N/A  Femoral Palpable Palpable  Popliteal Palpable Palpable  PT Palpable Palpable  DP Palpable Palpable   Gastrointestinal: soft, non-tender/non-distended.  Musculoskeletal: M/S 5/5 throughout.  No deformity or atrophy. Trace RLE edema, 1+ LLE edema. Neurologic: Sensation grossly intact in extremities.  Symmetrical.  Speech is fluent.  Psychiatric: Judgment intact, Mood & affect appropriate for pt's clinical situation. Dermatologic: No rashes or ulcers noted.  No cellulitis or open wounds. Lymph : No Cervical, Axillary, or Inguinal lymphadenopathy.      Labs No results found for this or any previous visit (from the past 2160 hour(s)).  Radiology Ct Angio Chest Pe W Or Wo Contrast  Result Date: 06/15/2016 CLINICAL DATA:  Six-month follow-up for pulmonary embolus EXAM: CT ANGIOGRAPHY CHEST WITH CONTRAST TECHNIQUE: Multidetector CT imaging of the chest was performed using the standard protocol during bolus administration of intravenous contrast. Multiplanar CT image reconstructions and MIPs were obtained to evaluate the vascular anatomy. CONTRAST:  75 cc Isovue 370 IV COMPARISON:  02/25/2016 FINDINGS:  Cardiovascular: No residual filling defects in the pulmonary arteries. No evidence of acute or chronic pulmonary emboli. Heart is upper limits normal in size. Aorta is normal caliber. Mediastinum/Nodes: Small scattered mediastinal and bilateral hilar lymph nodes, none pathologically enlarged. Esophagus and trachea unremarkable. Lungs/Pleura: Scarring in the left lung peripherally, stable. No acute confluent airspace opacities. No pleural effusions. Upper Abdomen: Diffuse severe fatty infiltration of the liver. No acute findings in the upper abdomen. Musculoskeletal: Chest wall soft tissues are unremarkable. No acute bony abnormality. Review of the MIP images confirms the above findings. IMPRESSION: Resolution of previously seen chronic PE on the left. No acute or chronic pulmonary embolus currently. Scarring in the left lung. Fatty infiltration of the liver. No acute cardiopulmonary disease. Electronically Signed   By: Caryn Bee  [place] M.D.   On: 06/15/2016 11:22     Assessment/Plan  Primary hypercoagulable state (HCC) Has seen oncology. We'll continue anticoagulation.  Pulmonary embolism, bilateral (HCC) Recent CT scan demonstrates resolution of the previous pulmonary emboli.  Postphlebitic syndrome without complication The patient has mild to moderate postphlebitic symptoms. When he wears his compression stockings regularly, these are reasonably well controlled. He does not have ulceration or infection. I would continue to recommend increased activity, compression stockings, elevation, and he will remain on his anticoagulation for at least 1 year. I have offered him a six-month her 1 year follow-up or to call me if his symptoms worsen. He will call us back as needed.    Festus Barren, MD  06/17/2016 12:34 PM    This note was created with Dragon medical transcription system.  Any errors from dictation are purely unintentional

## 2016-06-17 NOTE — Assessment & Plan Note (Signed)
Has seen oncology. We'll continue anticoagulation.

## 2016-08-03 ENCOUNTER — Inpatient Hospital Stay: Payer: Self-pay | Admitting: Internal Medicine

## 2016-09-02 ENCOUNTER — Telehealth (INDEPENDENT_AMBULATORY_CARE_PROVIDER_SITE_OTHER): Payer: Self-pay

## 2016-09-02 ENCOUNTER — Telehealth (INDEPENDENT_AMBULATORY_CARE_PROVIDER_SITE_OTHER): Payer: Self-pay | Admitting: Vascular Surgery

## 2016-09-02 DIAGNOSIS — M79604 Pain in right leg: Secondary | ICD-10-CM

## 2016-09-02 NOTE — Telephone Encounter (Signed)
Called the patient to inform him that Dr. Wyn Quakerew wanted an ultrasound done in the office today or Monday, but we do not have any room for the patient on either of those days, so the patient was advised to go to the ER but he says that going to the ER is too expensive.

## 2016-09-02 NOTE — Telephone Encounter (Signed)
Patient called stating he has redness, soreness and heat in his right leg, patient has a history of DVT. I advised the patient per Dr. Wyn Quakerew that he needs to be seen at the ED for an evaluation of his leg for DVT because we cannot see the patient today or Monday for a DVT study, the patient stated he did not want to go to the ED because he did not want to spend 8 grand at the hospital. I advised that if he does have a DVT it could potentially travel and cause death. He stated he has had a DVT to travel to his heart and lungs and know what that feels like so it wasn't that big of a deal because he still takes Coumadin. He stated he would call back Monday to see if we had an opening on Tuesday to do a DVT study instead of going to the ED for evaluation.

## 2016-09-02 NOTE — Telephone Encounter (Signed)
PATIENT CALLED BACK 3 MORE TIMES. [facility] TO [facility].

## 2016-09-02 NOTE — Telephone Encounter (Signed)
Patient called and stated that his right leg has redness and warm to the touch. Also some pain & swelling. Hx of DVT left leg. Please advise

## 2016-09-05 ENCOUNTER — Telehealth (INDEPENDENT_AMBULATORY_CARE_PROVIDER_SITE_OTHER): Payer: Self-pay

## 2016-09-05 NOTE — Telephone Encounter (Signed)
Returned a call to the patient regarding having a DVT study. I let him know that an order was put in on Friday for him to have a DVT study and he could go and have that done at [facility].

## 2016-09-05 NOTE — Telephone Encounter (Signed)
ERROR

## 2016-09-06 ENCOUNTER — Ambulatory Visit
Admission: RE | Admit: 2016-09-06 | Discharge: 2016-09-06 | Disposition: A | Discharge status: Home / Self Care | Payer: Self-pay | Source: Ambulatory Visit | Attending: Vascular Surgery | Admitting: Vascular Surgery

## 2016-09-06 DIAGNOSIS — M799 Soft tissue disorder, unspecified: Secondary | ICD-10-CM | POA: Insufficient documentation

## 2016-09-06 DIAGNOSIS — M79604 Pain in right leg: Secondary | ICD-10-CM

## 2016-09-19 ENCOUNTER — Encounter: Payer: Self-pay | Admitting: *Deleted

## 2016-09-19 ENCOUNTER — Inpatient Hospital Stay: Payer: Self-pay | Attending: Internal Medicine | Admitting: Internal Medicine

## 2016-09-19 NOTE — Progress Notes (Signed)
Patient did not keep his scheduled apt today with Dr. Donneta RombergBrahmanday. Msg sent to patient to r/s this apt. This is the pt's 2nd no show with Dr. BLeonard Schwartz

## 2016-09-20 ENCOUNTER — Encounter (INDEPENDENT_AMBULATORY_CARE_PROVIDER_SITE_OTHER): Payer: Self-pay | Admitting: Vascular Surgery

## 2016-09-20 ENCOUNTER — Ambulatory Visit (INDEPENDENT_AMBULATORY_CARE_PROVIDER_SITE_OTHER): Payer: Self-pay | Admitting: Vascular Surgery

## 2016-09-20 VITALS — BP 150/88 | HR 81 | Resp 16 | Wt 275.0 lb

## 2016-09-20 DIAGNOSIS — I87009 Postthrombotic syndrome without complications of unspecified extremity: Secondary | ICD-10-CM

## 2016-09-20 DIAGNOSIS — I2699 Other pulmonary embolism without acute cor pulmonale: Secondary | ICD-10-CM

## 2016-09-20 DIAGNOSIS — D6859 Other primary thrombophilia: Secondary | ICD-10-CM

## 2016-09-20 DIAGNOSIS — I8001 Phlebitis and thrombophlebitis of superficial vessels of right lower extremity: Secondary | ICD-10-CM

## 2016-09-20 NOTE — Assessment & Plan Note (Signed)
His duplex shows superficial thrombophlebitis of the right lower extremity. We discussed that although this is often more painful, it is not as dangerous as a DVT. Clearly given his hypercoagulable condition he should maintain full anticoagulation. Compression stockings, elevation, and increasing activity as tolerated.

## 2016-09-20 NOTE — Assessment & Plan Note (Signed)
Left leg remains quite swollen. Recommend compression stockings, elevation, and increasing activity.

## 2016-09-20 NOTE — Progress Notes (Signed)
MRN : [id number]  Frank Rivera is a 56 y.o. (03/27/1961) male who presents with chief complaint of  Chief Complaint  Patient presents with  . ultrasound follow up  .  History of Present Illness: Patient returns today in follow up of leg pain and swelling.  A recent ultrasound showed thrombosed superficial varicosities in the right thigh consistent with superficial thrombophlebitis but no DVT was identified. The patient continues to have pain although he does say that it is gradually getting better. No chest pain or shortness of breath. He continues on his anticoagulation without any signs of bleeding.  Current Outpatient Prescriptions  Medication Sig Dispense Refill  . acetaminophen (TYLENOL) 650 MG CR tablet Take 1,300 mg by mouth every morning.     Marland Kitchen albuterol (PROVENTIL HFA;VENTOLIN HFA) 108 (90 Base) MCG/ACT inhaler Inhale 1-2 puffs into the lungs every 6 (six) hours as needed for wheezing or shortness of breath. 1 Inhaler 2  . cephALEXin (KEFLEX) 500 MG capsule take 1 capsule by mouth every 12 hours if needed  0  . diphenhydramine-acetaminophen (TYLENOL PM) 25-500 MG TABS tablet Take 2 tablets by mouth at bedtime as needed.    . furosemide (LASIX) 20 MG tablet Take 20 mg by mouth daily.  0  . oxyCODONE-acetaminophen (PERCOCET/ROXICET) 5-325 MG tablet Take 0.5 tablets by mouth daily. 30 tablet 0  . potassium chloride (K-DUR,KLOR-CON) 10 MEQ tablet Take 1 tablet by mouth daily.  0  . simvastatin (ZOCOR) 40 MG tablet Take 40 mg by mouth daily. Takes 1.5 tablets in the evening    . traMADol (ULTRAM) 50 MG tablet take 1 tablet by mouth every 6 hours if needed for pain  0  . warfarin (COUMADIN) 5 MG tablet Take 0.5 tablets by mouth daily.  0   No current facility-administered medications for this visit.         Past Medical History:  Diagnosis Date  . Dyspnea on exertion   . Pulmonary emboli (HCC)   . Pulmonary embolism (HCC) 2011         Past Surgical History:    Procedure Laterality Date  . APPENDECTOMY     as child  . TONSILLECTOMY AND ADENOIDECTOMY     as child    Social History      Social History  Substance Use Topics  . Smoking status: Former Smoker    Packs/day: 1.00    Years: 25.00    Types: Cigarettes    Quit date: 11/17/2015  . Smokeless tobacco: Never Used  . Alcohol use No    Family History       Family History  Problem Relation Age of Onset  . Melanoma Father     face  . Colon cancer Paternal Uncle     died of colon ca - dx at age 99's  . Diabetes Neg Hx          Allergies  Allergen Reactions  . Sulfa Antibiotics Other (See Comments)    As a child-told he had reaction to sulfa drugs.     REVIEW OF SYSTEMS (Negative unless checked)  Constitutional: [] Weight loss  [] Fever  [] Chills Cardiac: [] Chest pain   [] Chest pressure   [] Palpitations   [] Shortness of breath when laying flat   [] Shortness of breath at rest   [] Shortness of breath with exertion. Vascular:  [] Pain in legs with walking   [] Pain in legs at rest   [] Pain in legs when laying flat   [] Claudication   []   Pain in feet when walking  [] Pain in feet at rest  [] Pain in feet when laying flat   [x] History of DVT   [] Phlebitis   [x] Swelling in legs   [] Varicose veins   [] Non-healing ulcers Pulmonary:   [] Uses home oxygen   [] Productive cough   [] Hemoptysis   [] Wheeze  [] COPD   [] Asthma Neurologic:  [] Dizziness  [] Blackouts   [] Seizures   [] History of stroke   [] History of TIA  [] Aphasia   [] Temporary blindness   [] Dysphagia   [] Weakness or numbness in arms   [] Weakness or numbness in legs Musculoskeletal:  [] Arthritis   [] Joint swelling   [] Joint pain   [] Low back pain Hematologic:  [] Easy bruising  [] Easy bleeding   [] Hypercoagulable state   [] Anemic   Gastrointestinal:  [] Blood in stool   [] Vomiting blood  [] Gastroesophageal reflux/heartburn   [] Abdominal pain Genitourinary:  [] Chronic kidney disease   [] Difficult urination   [] Frequent urination  [] Burning with urination   [] Hematuria Skin:  [] Rashes   [] Ulcers   [] Wounds Psychological:  [] History of anxiety   []  History of major depression.   Physical Examination  BP (!) 150/88   Pulse 81   Resp 16   Wt 275 lb (124.7 kg)   BMI 40.61 kg/m  Gen:  WD/WN, NAD Head: [place]/AT, No temporalis wasting. Ear/Nose/Throat: Hearing grossly intact, nares w/o erythema or drainage, trachea midline Eyes: Conjunctiva clear. Sclera non-icteric Neck: Supple.  No JVD.  Pulmonary:  Good air movement, no use of accessory muscles.  Cardiac: RRR, normal S1, S2 Vascular:  Vessel Right Left  Radial Palpable Palpable  Ulnar Palpable Palpable  Brachial Palpable Palpable  Carotid Palpable, without bruit Palpable, without bruit  Aorta Not palpable N/A  Femoral Palpable Palpable  Popliteal Palpable Palpable  PT Palpable Palpable  DP Palpable Palpable    Musculoskeletal: M/S 5/5 throughout.  No deformity or atrophy. 1-2+ RLE edema, 2+ LLE edema. Neurologic: Sensation grossly intact in extremities.  Symmetrical.  Speech is fluent.  Psychiatric: Judgment intact, Mood & affect appropriate for pt's clinical situation. Dermatologic: No rashes or ulcers noted.  No cellulitis or open wounds.       Labs No results found for this or any previous visit (from the past 2160 hour(s)).  Radiology US Venous Img Lower Unilateral Right  Result Date: 09/07/2016 CLINICAL DATA:  Acute onset of right lower extremity pain. Initial encounter. EXAM: RIGHT LOWER EXTREMITY VENOUS DOPPLER ULTRASOUND TECHNIQUE: Gray-scale sonography with graded compression, as well as color Doppler and duplex ultrasound were performed to evaluate the lower extremity deep venous systems from the level of the common femoral vein and including the common femoral, femoral, profunda femoral, popliteal and calf veins including the posterior tibial, peroneal and gastrocnemius veins when visible. The superficial great  saphenous vein was also interrogated. Spectral Doppler was utilized to evaluate flow at rest and with distal augmentation maneuvers in the common femoral, femoral and popliteal veins. COMPARISON:  None. FINDINGS: Contralateral Common Femoral Vein: Respiratory phasicity is normal and symmetric with the symptomatic side. No evidence of thrombus. Normal compressibility. Common Femoral Vein: No evidence of thrombus. Normal compressibility, respiratory phasicity and response to augmentation. Saphenofemoral Junction: No evidence of thrombus. Normal compressibility and flow on color Doppler imaging. Profunda Femoral Vein: No evidence of thrombus. Normal compressibility and flow on color Doppler imaging. Femoral Vein: No evidence of thrombus. Normal compressibility, respiratory phasicity and response to augmentation. Popliteal Vein: No evidence of thrombus. Normal compressibility, respiratory phasicity and response to augmentation. Calf  Veins: No evidence of thrombus. Normal compressibility and flow on color Doppler imaging. Superficial Great Saphenous Vein: No evidence of thrombus. Normal compressibility and flow on color Doppler imaging. Venous Reflux:  None. Other Findings: Prominent thrombosed varices are noted at the proximal medial right thigh and right inguinal region, and at the medial right calf. Mild soft tissue edema is noted at the medial ankle. IMPRESSION: 1. No evidence of DVT within the right lower extremity. 2. Prominent thrombosed superficial varices at the proximal medial right thigh and right inguinal region, and at the medial right calf. This corresponds to the location of the patient's symptoms. 3. Mild soft tissue edema at the medial aspect of the ankle. Electronically Signed   By: Roanna RaiderJeffery  Chang M.D.   On: 09/07/2016 03:32     Assessment/Plan Primary hypercoagulable state (HCC) Has seen oncology. We'll continue anticoagulation. A reason for recent superficial thrombophlebitis.  Pulmonary  embolism, bilateral (HCC) Recent CT scan demonstrates resolution of the previous pulmonary emboli.  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 Recommend compression stockings, elevation, and increasing activity.  Superficial thrombophlebitis of right leg His duplex shows superficial thrombophlebitis of the right lower extremity. We discussed that although this is often more painful, it is not as dangerous as a DVT. Clearly given his hypercoagulable condition he should maintain full anticoagulation. Compression stockings, elevation, and increasing activity as tolerated.    Festus BarrenJason Tibor Lemmons, MD  09/20/2016 5:02 PM    This note was created with Dragon medical transcription system.  Any errors from dictation are purely unintentional

## 2016-12-16 ENCOUNTER — Ambulatory Visit (INDEPENDENT_AMBULATORY_CARE_PROVIDER_SITE_OTHER): Payer: Self-pay | Admitting: Vascular Surgery

## 2016-12-16 ENCOUNTER — Encounter (INDEPENDENT_AMBULATORY_CARE_PROVIDER_SITE_OTHER): Payer: Self-pay | Admitting: Vascular Surgery

## 2016-12-16 ENCOUNTER — Telehealth (INDEPENDENT_AMBULATORY_CARE_PROVIDER_SITE_OTHER): Payer: Self-pay

## 2016-12-16 ENCOUNTER — Other Ambulatory Visit (INDEPENDENT_AMBULATORY_CARE_PROVIDER_SITE_OTHER): Payer: Self-pay | Admitting: Vascular Surgery

## 2016-12-16 ENCOUNTER — Other Ambulatory Visit (INDEPENDENT_AMBULATORY_CARE_PROVIDER_SITE_OTHER): Payer: Self-pay

## 2016-12-16 VITALS — BP 158/88 | HR 84 | Resp 17 | Wt 275.0 lb

## 2016-12-16 DIAGNOSIS — M7989 Other specified soft tissue disorders: Secondary | ICD-10-CM

## 2016-12-16 DIAGNOSIS — I8001 Phlebitis and thrombophlebitis of superficial vessels of right lower extremity: Secondary | ICD-10-CM

## 2016-12-16 DIAGNOSIS — M79609 Pain in unspecified limb: Secondary | ICD-10-CM | POA: Insufficient documentation

## 2016-12-16 DIAGNOSIS — M79604 Pain in right leg: Secondary | ICD-10-CM

## 2016-12-16 DIAGNOSIS — I87009 Postthrombotic syndrome without complications of unspecified extremity: Secondary | ICD-10-CM

## 2016-12-16 DIAGNOSIS — M79605 Pain in left leg: Secondary | ICD-10-CM

## 2016-12-16 NOTE — Assessment & Plan Note (Signed)
Given his previous history, a DVT study done today demonstrates no acute DVT or superficial thrombophlebitis. 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 Given this finding, I am concerned he could have some osteoarthritis or other orthopedic issue of the knees themselves. I would like to refer him to an orthopedic surgeon for further evaluation. He should continue to wear his stockings and elevate his legs.

## 2016-12-16 NOTE — Telephone Encounter (Signed)
I called in a  rx for Tramadol  1 po q 6hrs prn #50 1 refill,but [facility] has a policy for the first tramadol called in is that  the patient can receive #28 tab for 7 days with no refill but they will send if the patient need any refills for this medication and it is fine to refill.

## 2016-12-16 NOTE — Progress Notes (Signed)
MRN : [id number]  Frank Rivera is a 56 y.o. (11/02/1960) male who presents with chief complaint of  Chief Complaint  Patient presents with  . Follow-up    leg swelling and bil knee pain  .  History of Present Illness: Patient returns today in follow up of leg pain. Over the past couple of months, he is developed worsening pain medially in his knees bilaterally. He still has his persistent and chronic swelling a little worse on the left than the right. He has a hypercoagulable state and has been maintained on anticoagulation which he is taking. No chest pain or shortness of breath.  Given his previous history, a DVT study done today demonstrates no acute DVT or superficial thrombophlebitis. 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         Current Outpatient Prescriptions  Medication Sig Dispense Refill  . acetaminophen (TYLENOL) 650 MG CR tablet Take 1,300 mg by mouth every morning.     Marland Kitchen albuterol (PROVENTIL HFA;VENTOLIN HFA) 108 (90 Base) MCG/ACT inhaler Inhale 1-2 puffs into the lungs every 6 (six) hours as needed for wheezing or shortness of breath. 1 Inhaler 2  . cephALEXin (KEFLEX) 500 MG capsule take 1 capsule by mouth every 12 hours if needed  0  . diphenhydramine-acetaminophen (TYLENOL PM) 25-500 MG TABS tablet Take 2 tablets by mouth at bedtime as needed.    . furosemide (LASIX) 20 MG tablet Take 20 mg by mouth daily.  0  . oxyCODONE-acetaminophen (PERCOCET/ROXICET) 5-325 MG tablet Take 0.5 tablets by mouth daily. 30 tablet 0  . potassium chloride (K-DUR,KLOR-CON) 10 MEQ tablet Take 1 tablet by mouth daily.  0  . simvastatin (ZOCOR) 40 MG tablet Take 40 mg by mouth daily. Takes 1.5 tablets in the evening    . traMADol (ULTRAM) 50 MG tablet take 1 tablet by mouth every 6 hours if needed for pain  0  . warfarin (COUMADIN) 5 MG tablet Take 0.5 tablets by mouth daily.  0   No current  facility-administered medications for this visit.         Past Medical History:  Diagnosis Date  . Dyspnea on exertion   . Pulmonary emboli (HCC)   . Pulmonary embolism (HCC) 2011         Past Surgical History:  Procedure Laterality Date  . APPENDECTOMY     as child  . TONSILLECTOMY AND ADENOIDECTOMY     as child    Social History      Social History  Substance Use Topics  . Smoking status: Former Smoker    Packs/day: 1.00    Years: 25.00    Types: Cigarettes    Quit date: 11/17/2015  . Smokeless tobacco: Never Used  . Alcohol use No    Family History       Family History  Problem Relation Age of Onset  . Melanoma Father     face  . Colon cancer Paternal Uncle     died of colon ca - dx at age 31's  . Diabetes Neg Hx          Allergies  Allergen Reactions  . Sulfa Antibiotics Other (See Comments)    As a child-told he had reaction to sulfa drugs.     REVIEW OF SYSTEMS(Negative unless checked)  Constitutional: Weight loss[] Fever[] Chills Cardiac:[] Chest pain[] Chest pressure[] Palpitations Shortness of breath when laying flat Shortness of breath at rest   Shortness of breath with exertion. Vascular: Pain in legs with walking[] Pain in legsat rest[] Pain in legs when laying flat Claudication Pain in feet when walking Pain in feet at rest Pain in feet when laying flat History of DVT Phlebitis Swelling in legs Varicose veins Non-healing ulcers Pulmonary: Uses home oxygen Productive cough[] Hemoptysis Wheeze COPD Asthma Neurologic: Dizziness Blackouts Seizures History of stroke History of TIA[] Aphasia Temporary blindness[] Dysphagia Weaknessor numbness in arms Weakness or numbnessin legs Musculoskeletal: Arthritis Joint swelling Joint pain Low back  pain Hematologic:[] Easy bruising[] Easy bleeding Hypercoagulable state Anemic  Gastrointestinal:[] Blood in stool[] Vomiting blood[] Gastroesophageal reflux/heartburn[] Abdominal pain Genitourinary: Chronic kidney disease Difficulturination Frequenturination Burning with urination[] Hematuria Skin: Rashes Ulcers Wounds Psychological: History of anxiety[] History of major depression.   Physical Examination  BP (!) 158/88   Pulse 84   Resp 17   Wt 275 lb (124.7 kg)   BMI 40.61 kg/m  Gen:  WD/WN, NAD Head: [place]/AT, No temporalis wasting. Ear/Nose/Throat: Hearing grossly intact, nares w/o erythema or drainage, trachea midline Eyes: Conjunctiva clear. Sclera non-icteric Neck: Supple.  No JVD.  Pulmonary:  Good air movement, no use of accessory muscles.  Cardiac: RRR, normal S1, S2 Vascular:  Vessel Right Left  Radial Palpable Palpable                      Popliteal Not Palpable Not Palpable  PT 1+ Palpable Trace Palpable  DP Palpable Palpable    Musculoskeletal: M/S 5/5 throughout.  No deformity or atrophy. 1+ right lower extremity edema, 1-2+ left lower extremity edema. Neurologic: Sensation grossly intact in extremities.  Symmetrical.  Speech is fluent.  Psychiatric: Judgment intact, Mood & affect appropriate for pt's clinical situation. Dermatologic: No rashes or ulcers noted.  No cellulitis or open wounds.       Labs No results found for this or any previous visit (from the past 2160 hour(s)).  Radiology No results found.   Assessment/Plan Primary hypercoagulable state (HCC) Has seen oncology. We'll continue anticoagulation. A reason for recent superficial thrombophlebitis.  Pulmonary embolism, bilateral (HCC) Earlier this year, CT scan demonstrates resolution of the previous pulmonary emboli.  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 Recommend compression stockings,  elevation, and increasing activity.  Pain in limb Given his previous history, a DVT study done today demonstrates no acute DVT or superficial thrombophlebitis. 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 Given this finding, I am concerned he could have some osteoarthritis or other orthopedic issue of the knees themselves. I would like to refer him to an orthopedic surgeon for further evaluation. He should continue to wear his stockings and elevate his legs.    Festus Barren, MD  12/16/2016 12:20 PM    This note was created with Dragon medical transcription system.  Any errors from dictation are purely unintentional

## 2017-03-13 ENCOUNTER — Ambulatory Visit: Payer: Self-pay

## 2017-03-14 ENCOUNTER — Ambulatory Visit: Payer: Self-pay

## 2017-03-14 DIAGNOSIS — I809 Phlebitis and thrombophlebitis of unspecified site: Secondary | ICD-10-CM

## 2017-03-14 LAB — POCT INR: INR: 2.4

## 2017-09-08 IMAGING — CT CT ANGIO CHEST
2 of 6 series · 18 of 46 positions shown · IV contrast (isovue)
Comparison: Chest radiograph 11/17/2015

CLINICAL DATA: Chest pain starting 3 hours ago.

EXAM:
CT ANGIOGRAPHY CHEST WITH CONTRAST
TECHNIQUE: Multidetector CT imaging of the chest was performed using the
standard protocol during bolus administration of intravenous
contrast. Multiplanar CT image reconstructions and MIPs were
obtained to evaluate the vascular anatomy.
CONTRAST:  75 cc Isovue 370 intravenously.

[Series 6: thins · axial · 0.75mm/px · z∈[-123,+167]mm · 15 of 318 slices shown]
[im 14/318  lung]
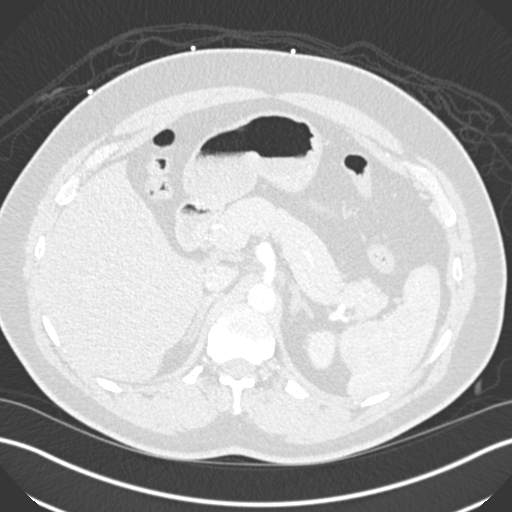
[im 42/318  soft-tissue]
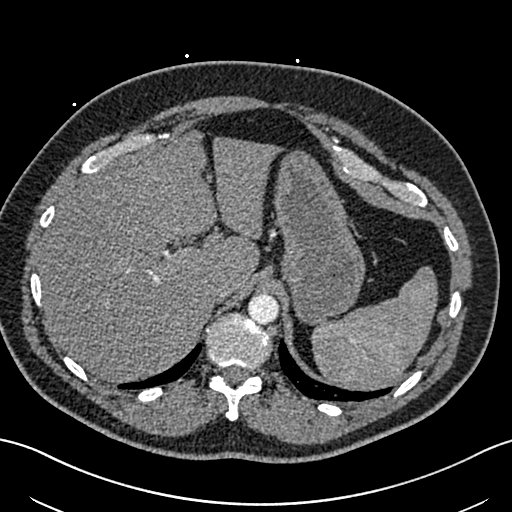
[im 56/318  lung]
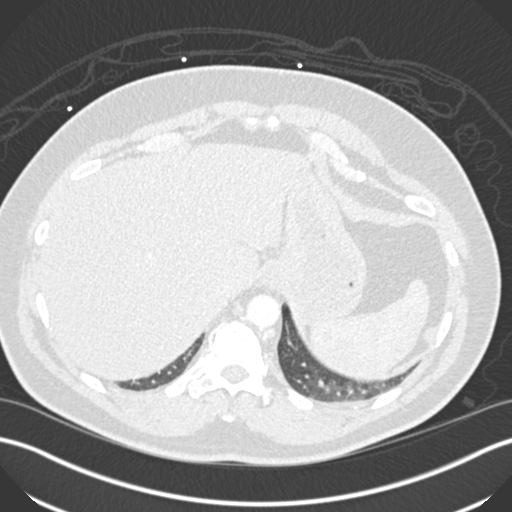
[im 83/318  soft-tissue]
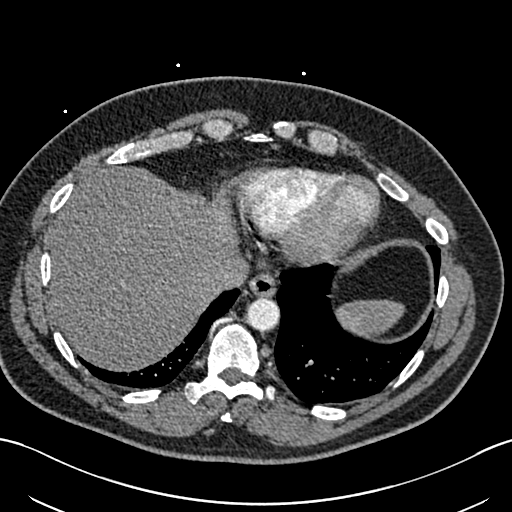
[im 97/318  lung]
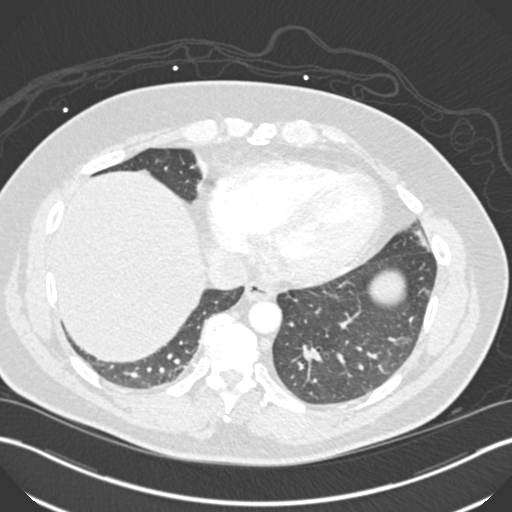
[im 125/318  soft-tissue]
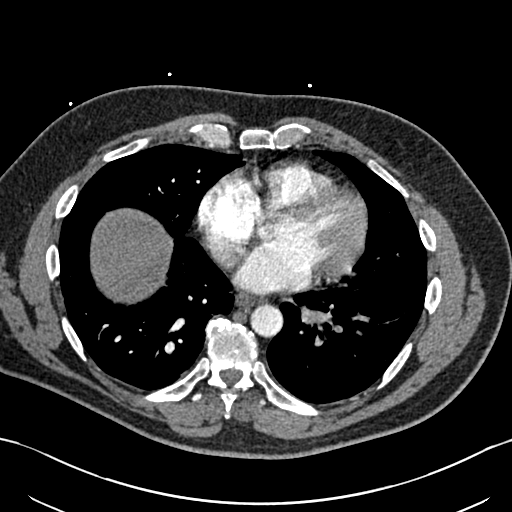
[im 138/318  lung]
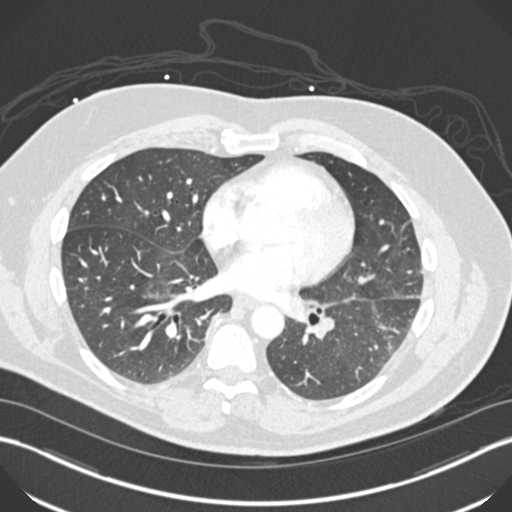
[im 166/318  soft-tissue]
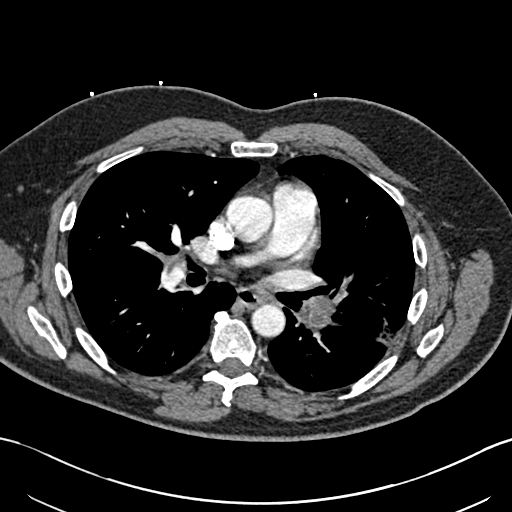
[im 180/318  lung]
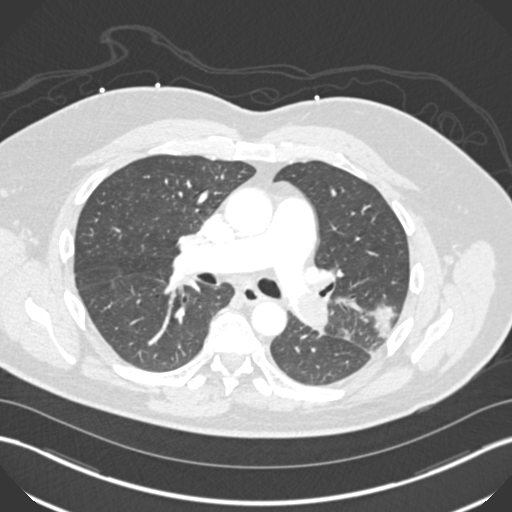
[im 193/318  soft-tissue]
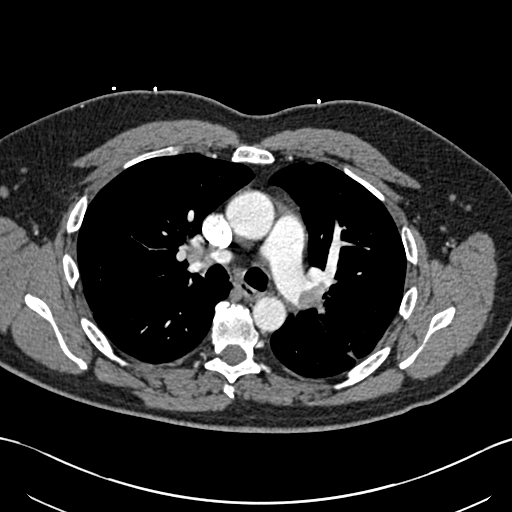
[im 221/318  lung]
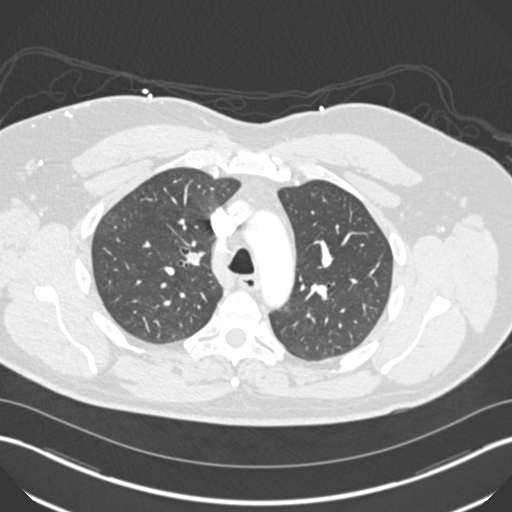
[im 235/318  soft-tissue]
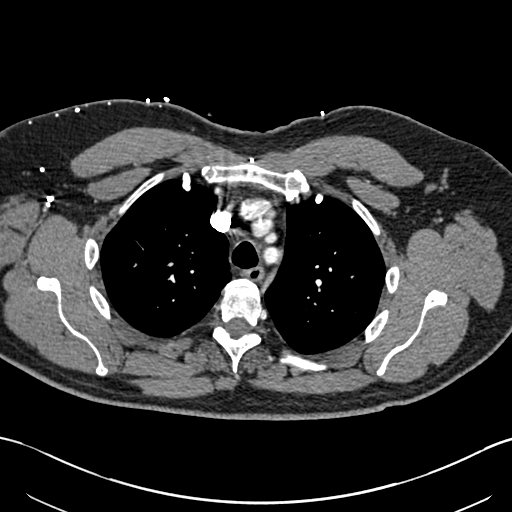
[im 262/318  lung]
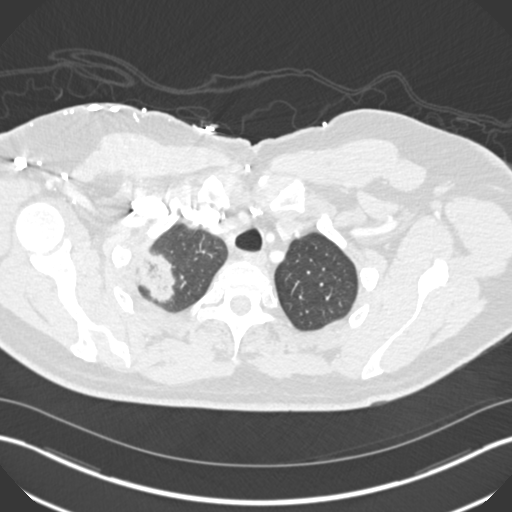
[im 276/318  soft-tissue]
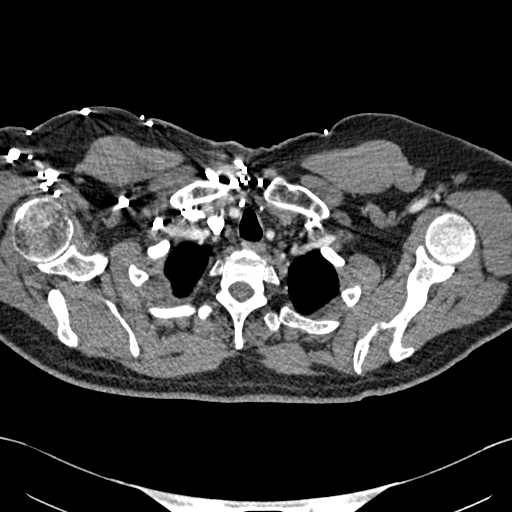
[im 304/318  lung]
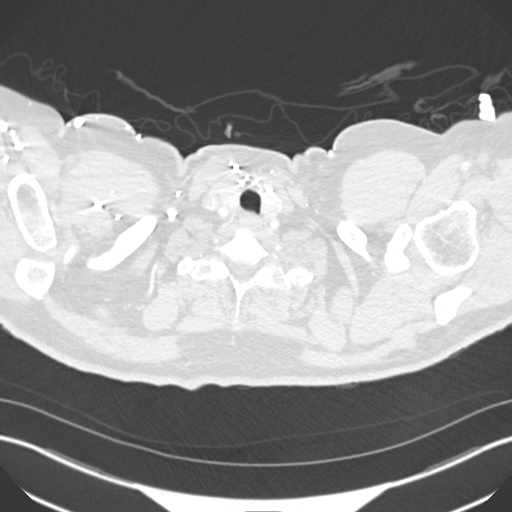

[Series 8: coronal mpr · coronal · 0.75mm/px · 3 of 133 slices shown]
[im 34/133  soft-tissue]
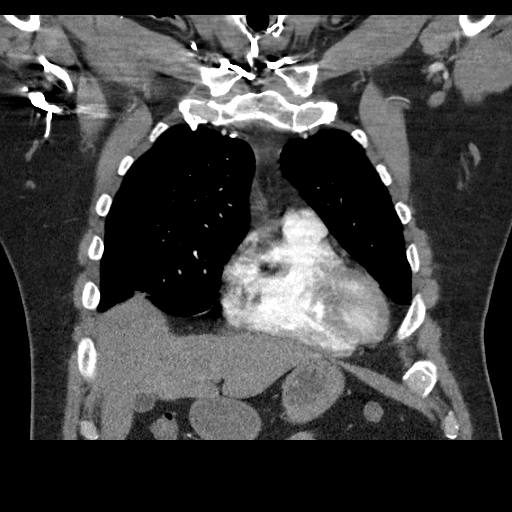
[im 67/133  soft-tissue]
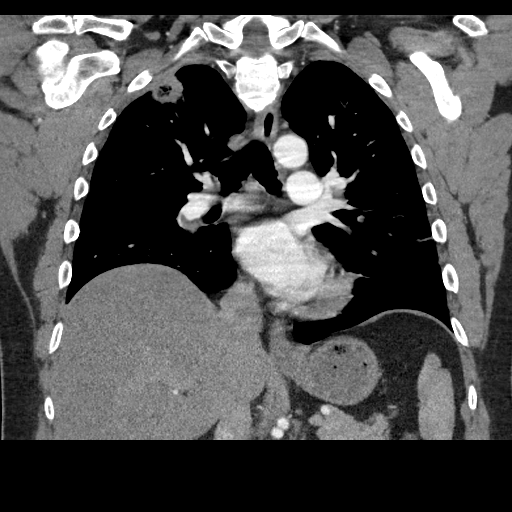
[im 100/133  soft-tissue]
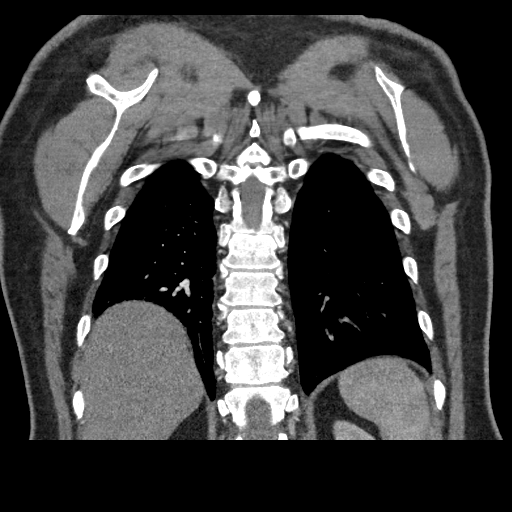

[18 of 46 positions shown; findings below may reference images not displayed]

FINDINGS: Mediastinum/Lymph Nodes: There are bilateral central pulmonary
emboli with heavy clot burden. A near occlusive large pulmonary
embolus is seen in the left main pulmonary artery extending to all
lobar branches with occlusive pulmonary emboli within the left lower
and lingular lobar and segmental branches. There are slightly more
peripheral nonocclusive pulmonary emboli on the right involving the
takeoff of all lobar branches. There is near occlusive pulmonary
embolus within a segmental branch in the right lower lobe. No
evidence of right heart strain.

Lungs/Pleura: There is a cavitary subpleural mass in the lateral
aspect of the right upper lobe measuring 4.2 x 2.2 x 2.1 cm.
Peripheral pleural based patchy areas of airspace consolidation are
seen in the left lower lobe and lingula, and in the lateral aspect
of the left upper lobe.

Upper abdomen: No acute findings.

Musculoskeletal: No chest wall mass or suspicious bone lesions
identified.

Review of the MIP images confirms the above findings.
IMPRESSION: Bilateral pulmonary emboli with having clot burden.

No evidence of right heart strain.

Cavitary subpleural mass in the lateral aspect of the right upper
lobe which may represent area of lung necrosis due to hypoperfusion,
lung abscess or cavitary primary lung malignancy.

Areas of patchy airspace consolidation in the periphery of the left
lung may represent sequela of hypoperfusion.

Critical Value/emergent results were called by telephone at the time
of interpretation on 11/17/2015 at [DATE] to Dr. LORENZ JUMPER ,
who verbally acknowledged these results.

## 2017-09-18 DIAGNOSIS — M1712 Unilateral primary osteoarthritis, left knee: Secondary | ICD-10-CM | POA: Insufficient documentation

## 2017-09-26 DIAGNOSIS — Z6841 Body Mass Index (BMI) 40.0 and over, adult: Secondary | ICD-10-CM | POA: Insufficient documentation

## 2018-01-24 ENCOUNTER — Other Ambulatory Visit: Payer: Self-pay | Admitting: Orthopedic Surgery

## 2018-01-24 DIAGNOSIS — M25561 Pain in right knee: Principal | ICD-10-CM

## 2018-01-24 DIAGNOSIS — M25562 Pain in left knee: Secondary | ICD-10-CM

## 2018-01-24 DIAGNOSIS — G8929 Other chronic pain: Secondary | ICD-10-CM

## 2018-02-01 ENCOUNTER — Ambulatory Visit
Admission: RE | Admit: 2018-02-01 | Discharge: 2018-02-01 | Disposition: A | Discharge status: Home / Self Care | Payer: No Typology Code available for payment source | Source: Ambulatory Visit | Attending: Orthopedic Surgery | Admitting: Orthopedic Surgery

## 2018-02-01 DIAGNOSIS — M25562 Pain in left knee: Secondary | ICD-10-CM

## 2018-02-01 DIAGNOSIS — M25561 Pain in right knee: Secondary | ICD-10-CM | POA: Diagnosis present

## 2018-02-01 DIAGNOSIS — G8929 Other chronic pain: Secondary | ICD-10-CM

## 2018-02-01 DIAGNOSIS — M170 Bilateral primary osteoarthritis of knee: Secondary | ICD-10-CM | POA: Insufficient documentation

## 2018-02-14 ENCOUNTER — Encounter (INDEPENDENT_AMBULATORY_CARE_PROVIDER_SITE_OTHER): Payer: Self-pay

## 2018-02-14 ENCOUNTER — Other Ambulatory Visit: Payer: Self-pay

## 2018-02-14 ENCOUNTER — Encounter
Admission: RE | Admit: 2018-02-14 | Discharge: 2018-02-14 | Disposition: A | Discharge status: Home / Self Care | Payer: PRIVATE HEALTH INSURANCE | Source: Ambulatory Visit | Attending: Orthopedic Surgery | Admitting: Orthopedic Surgery

## 2018-02-14 DIAGNOSIS — Z01812 Encounter for preprocedural laboratory examination: Secondary | ICD-10-CM | POA: Insufficient documentation

## 2018-02-14 DIAGNOSIS — E785 Hyperlipidemia, unspecified: Secondary | ICD-10-CM

## 2018-02-14 HISTORY — DX: Personal history of other venous thrombosis and embolism: Z86.718

## 2018-02-14 HISTORY — DX: Hyperlipidemia, unspecified: E78.5

## 2018-02-14 HISTORY — DX: Dyspnea, unspecified: R06.00

## 2018-02-14 LAB — TYPE AND SCREEN
ABO/RH(D): A POS
ANTIBODY SCREEN: NEGATIVE

## 2018-02-14 LAB — CBC
HEMATOCRIT: 44 % (ref 39.0–52.0)
HEMOGLOBIN: 14.8 g/dL (ref 13.0–17.0)
MCH: 33.1 pg (ref 26.0–34.0)
MCHC: 33.6 g/dL (ref 30.0–36.0)
MCV: 98.4 fL (ref 80.0–100.0)
Platelets: 235 10*3/uL (ref 150–400)
RBC: 4.47 MIL/uL (ref 4.22–5.81)
RDW: 11.9 % (ref 11.5–15.5)
WBC: 7.8 10*3/uL (ref 4.0–10.5)
nRBC: 0 % (ref 0.0–0.2)

## 2018-02-14 LAB — PROTIME-INR
INR: 0.98
Prothrombin Time: 12.9 seconds (ref 11.4–15.2)

## 2018-02-14 LAB — URINALYSIS, COMPLETE (UACMP) WITH MICROSCOPIC
Bacteria, UA: NONE SEEN
Bilirubin Urine: NEGATIVE
Glucose, UA: NEGATIVE mg/dL
HGB URINE DIPSTICK: NEGATIVE
KETONES UR: NEGATIVE mg/dL
Leukocytes, UA: NEGATIVE
NITRITE: NEGATIVE
Protein, ur: NEGATIVE mg/dL
Specific Gravity, Urine: 1.021 (ref 1.005–1.030)
pH: 5 (ref 5.0–8.0)

## 2018-02-14 LAB — SEDIMENTATION RATE: SED RATE: 20 mm/h (ref 0–20)

## 2018-02-14 LAB — BASIC METABOLIC PANEL
Anion gap: 7 (ref 5–15)
BUN: 11 mg/dL (ref 6–20)
CHLORIDE: 105 mmol/L (ref 98–111)
CO2: 28 mmol/L (ref 22–32)
CREATININE: 0.73 mg/dL (ref 0.61–1.24)
Calcium: 9.3 mg/dL (ref 8.9–10.3)
GFR calc Af Amer: >60 mL/min (ref >60)
GFR calc non Af Amer: >60 mL/min (ref >60)
GLUCOSE: 97 mg/dL (ref 70–99)
POTASSIUM: 3.9 mmol/L (ref 3.5–5.1)
Sodium: 140 mmol/L (ref 135–145)

## 2018-02-14 LAB — APTT: aPTT: 26 seconds (ref 24–36)

## 2018-02-14 LAB — SURGICAL PCR SCREEN
MRSA, PCR: NEGATIVE
Staphylococcus aureus: NEGATIVE

## 2018-02-14 NOTE — Patient Instructions (Signed)
Your procedure is scheduled on: 02/27/18 Tues Report to Same Day Surgery 2nd floor medical mall [facility](Medical Mall Entrance-take elevator on left to 2nd floor.  Check in with surgery information desk.) To find out your arrival time please call [phone] between 1PM - 3PM on 02/26/18 Mon  Remember: Instructions that are not followed completely may result in serious medical risk, up to and including death, or upon the discretion of your surgeon and anesthesiologist your surgery may need to be rescheduled.    _x___ 1. Do not eat food after midnight the night before your procedure. You may drink clear liquids up to 2 hours before you are scheduled to arrive at the hospital for your procedure.  Do not drink clear liquids within 2 hours of your scheduled arrival to the hospital.  Clear liquids include  --Water or Apple juice without pulp  --Clear carbohydrate beverage such as ClearFast or Gatorade  --Black Coffee or Clear Tea (No milk, no creamers, do not add anything to                  the coffee or Tea Type 1 and type 2 diabetics should only drink water.   ____Ensure clear carbohydrate drink on the way to the hospital for bariatric patients  ____Ensure clear carbohydrate drink 3 hours before surgery for Dr Rutherford NailByrnett's patients if physician instructed.   No gum chewing or hard candies.     __x__ 2. No Alcohol for 24 hours before or after surgery.   __x__3. No Smoking or e-cigarettes for 24 prior to surgery.  Do not use any chewable tobacco products for at least 6 hour prior to surgery   ____  4. Bring all medications with you on the day of surgery if instructed.    __x__ 5. Notify your doctor if there is any change in your medical condition     (cold, fever, infections).    x___6. On the morning of surgery brush your teeth with toothpaste and water.  You may rinse your mouth with mouth wash if you wish.  Do not swallow any toothpaste or mouthwash.   Do not wear jewelry, make-up, hairpins,  clips or nail polish.  Do not wear lotions, powders, or perfumes. You may wear deodorant.  Do not shave 48 hours prior to surgery. Men may shave face and neck.  Do not bring valuables to the hospital.    [facility] is not responsible for any belongings or valuables.               Contacts, dentures or bridgework may not be worn into surgery.  Leave your suitcase in the car. After surgery it may be brought to your room.  For patients admitted to the hospital, discharge time is determined by your                       treatment team.  _  Patients discharged the day of surgery will not be allowed to drive home.  You will need someone to drive you home and stay with you the night of your procedure.    Please read over the following fact sheets that you were given:   [facility] Preparing for Surgery and or MRSA Information   _x___ Take anti-hypertensive listed below, cardiac, seizure, asthma,     anti-reflux and psychiatric medicines. These include:  1. None  2.  3.  4.  5.  6.  ____Fleets enema or Magnesium Citrate as directed.  _x___ Use CHG Soap or sage wipes as directed on instruction sheet   ____ Use inhalers on the day of surgery and bring to hospital day of surgery  ____ Stop Metformin and Janumet 2 days prior to surgery.    ____ Take 1/2 of usual insulin dose the night before surgery and none on the morning     surgery.   _x___ Follow recommendations from Cardiologist, Pulmonologist or PCP regarding          stopping Aspirin, Coumadin, Plavix ,Eliquis, Effient, or Pradaxa, and Pletal.  X____Stop Anti-inflammatories such as Advil, Aleve, Ibuprofen, Motrin, Naproxen, Naprosyn, Goodies powders or aspirin products. OK to take Tylenol and                          Celebrex.   _x___ Stop supplements until after surgery.  But may continue Vitamin D, Vitamin B,       and multivitamin.   ____ Bring C-Pap to the hospital.    

## 2018-02-15 LAB — URINE CULTURE: CULTURE: NO GROWTH

## 2018-02-19 ENCOUNTER — Other Ambulatory Visit (INDEPENDENT_AMBULATORY_CARE_PROVIDER_SITE_OTHER): Payer: Self-pay | Admitting: Nurse Practitioner

## 2018-02-20 MED ORDER — DEXTROSE 5 % IV SOLN
2.0000 g | Freq: Once | INTRAVENOUS | Status: DC
  Filled 2018-02-20: qty 20

## 2018-02-21 ENCOUNTER — Encounter
Admission: RE | Disposition: A | Discharge status: Home / Self Care | Payer: Self-pay | Source: Ambulatory Visit | Attending: Vascular Surgery

## 2018-02-21 ENCOUNTER — Other Ambulatory Visit: Payer: Self-pay

## 2018-02-21 ENCOUNTER — Ambulatory Visit
Admission: RE | Admit: 2018-02-21 | Discharge: 2018-02-21 | Disposition: A | Discharge status: Home / Self Care | Payer: PRIVATE HEALTH INSURANCE | Source: Ambulatory Visit | Attending: Vascular Surgery | Admitting: Vascular Surgery

## 2018-02-21 DIAGNOSIS — D6859 Other primary thrombophilia: Secondary | ICD-10-CM | POA: Insufficient documentation

## 2018-02-21 DIAGNOSIS — M170 Bilateral primary osteoarthritis of knee: Secondary | ICD-10-CM

## 2018-02-21 DIAGNOSIS — Z9889 Other specified postprocedural states: Secondary | ICD-10-CM | POA: Insufficient documentation

## 2018-02-21 DIAGNOSIS — I82409 Acute embolism and thrombosis of unspecified deep veins of unspecified lower extremity: Secondary | ICD-10-CM

## 2018-02-21 DIAGNOSIS — I87009 Postthrombotic syndrome without complications of unspecified extremity: Secondary | ICD-10-CM | POA: Diagnosis not present

## 2018-02-21 DIAGNOSIS — Z882 Allergy status to sulfonamides status: Secondary | ICD-10-CM | POA: Insufficient documentation

## 2018-02-21 DIAGNOSIS — I2699 Other pulmonary embolism without acute cor pulmonale: Secondary | ICD-10-CM | POA: Insufficient documentation

## 2018-02-21 DIAGNOSIS — Z87891 Personal history of nicotine dependence: Secondary | ICD-10-CM | POA: Insufficient documentation

## 2018-02-21 DIAGNOSIS — I82401 Acute embolism and thrombosis of unspecified deep veins of right lower extremity: Secondary | ICD-10-CM

## 2018-02-21 DIAGNOSIS — Z7901 Long term (current) use of anticoagulants: Secondary | ICD-10-CM | POA: Insufficient documentation

## 2018-02-21 DIAGNOSIS — Z86718 Personal history of other venous thrombosis and embolism: Secondary | ICD-10-CM | POA: Insufficient documentation

## 2018-02-21 HISTORY — PX: IVC FILTER INSERTION: CATH118245

## 2018-02-21 SURGERY — IVC FILTER INSERTION
Anesthesia: Moderate Sedation | Laterality: Right

## 2018-02-21 MED ORDER — FENTANYL CITRATE (PF) 100 MCG/2ML IJ SOLN
INTRAMUSCULAR | Status: DC | PRN
  Administered 2018-02-21 (×2): 50 ug via INTRAVENOUS

## 2018-02-21 MED ORDER — HYDROMORPHONE HCL 1 MG/ML IJ SOLN: 1.0000 mg | Freq: Once | INTRAMUSCULAR | Status: DC | PRN

## 2018-02-21 MED ORDER — MIDAZOLAM HCL 5 MG/5ML IJ SOLN
INTRAMUSCULAR | Status: AC
  Filled 2018-02-21: qty 5

## 2018-02-21 MED ORDER — SODIUM CHLORIDE 0.9 % IV SOLN
INTRAVENOUS | Status: DC
  Administered 2018-02-21: 14:00:00 via INTRAVENOUS

## 2018-02-21 MED ORDER — CEFAZOLIN SODIUM-DEXTROSE 2-4 GM/100ML-% IV SOLN
INTRAVENOUS | Status: AC
  Administered 2018-02-21: 14:00:00
  Filled 2018-02-21: qty 100

## 2018-02-21 MED ORDER — MIDAZOLAM HCL 2 MG/2ML IJ SOLN
INTRAMUSCULAR | Status: DC | PRN
  Administered 2018-02-21 (×2): 2 mg via INTRAVENOUS

## 2018-02-21 MED ORDER — ONDANSETRON HCL 4 MG/2ML IJ SOLN: 4.0000 mg | Freq: Four times a day (QID) | INTRAMUSCULAR | Status: DC | PRN

## 2018-02-21 MED ORDER — FENTANYL CITRATE (PF) 100 MCG/2ML IJ SOLN
INTRAMUSCULAR | Status: AC
  Filled 2018-02-21: qty 2

## 2018-02-21 SURGICAL SUPPLY — 5 items
KIT FEMORAL DEL DENALI (Miscellaneous) ×3 IMPLANT
NEEDLE ENTRY 21GA 7CM ECHOTIP (NEEDLE) ×3 IMPLANT
PACK ANGIOGRAPHY (CUSTOM PROCEDURE TRAY) ×3 IMPLANT
SET INTRO CAPELLA COAXIAL (SET/KITS/TRAYS/PACK) ×3 IMPLANT
WIRE J 3MM .035X145CM (WIRE) ×3 IMPLANT

## 2018-02-21 NOTE — Op Note (Signed)
[facility]   OPERATIVE NOTE    PRE-OPERATIVE DIAGNOSIS: DVT with PE  POST-OPERATIVE DIAGNOSIS: Same  PROCEDURE: 1.   Ultrasound guidance for vascular access to the right common femoral vein 2.   Catheter placement into the inferior vena cava 3.   Inferior venacavogram 4.   Placement of a Denali IVC filter  SURGEON: Levora DredgeGregory Dario Yono  ASSISTANT(S): None  ANESTHESIA: Conscious sedation was administered by the interventional radiology RN under my direct supervision. IV Versed plus fentanyl were utilized. Continuous ECG, pulse oximetry and blood pressure was monitored throughout the entire procedure. Conscious sedation was for a total of 20 minutes.  ESTIMATED BLOOD LOSS: minimal  FINDING(S): 1.  Patent IVC  SPECIMEN(S):  none  INDICATIONS:   Frank Rivera is a 57 y.o. y.o. male who presents with history of DVT and PE.  He has a known hypercoagulable state.  He will be undergoing bilateral total knee replacement next week.  Inferior vena cava filter is indicated for this reason.  Risks and benefits including filter thrombosis, migration, fracture, bleeding, and infection were all discussed.  We discussed that all IVC filters that we place can be removed if desired from the patient once the need for the filter has passed.    DESCRIPTION: After obtaining full informed written consent, the patient was brought back to the vascular suite. The skin was sterilely prepped and draped in a sterile surgical field was created. Ultrasound was placed in a sterile sleeve. The right common femoral vein was echolucent and compressible indicating patency. Image was recorded for the permanent record. The puncture was made under continuous real-time ultrasound guidance.  The right common femoral vein was accessed under direct ultrasound guidance without difficulty with a micropuncture needle. Microwire was then advanced under fluoroscopic guidance without difficulty. Micro-sheath was  then inserted and a J-wire was then placed. The dilator is passed over the wire and the delivery sheath was placed into the inferior vena cava.  Inferior venacavogram was performed. This demonstrated a patent IVC with the level of the renal veins at L1.  The filter was then deployed into the inferior vena cava at the level of L2 just below the renal veins. The delivery sheath was then removed. Pressure was held. Sterile dressings were placed. The patient tolerated the procedure well and was taken to the recovery room in stable condition.  Interpretation: IVC is widely patent.  It measures 22 mm in diameter.  Renal blushes are noted at the L1 level.  Filter is deployed in excellent orientation.  COMPLICATIONS: None  CONDITION: Stable  Levora DredgeGregory Latif Nazareno  02/21/2018, 2:44 PM

## 2018-02-21 NOTE — H&P (Signed)
 @LOGO @   MRN : [id number]  Frank Rivera is a 57 y.o. (02/12/1961) male who presents with chief complaint of "I need a filter put in".  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  DVT was identified years ago and was treated with anticoagulation.  The presenting symptoms were pain and swelling in the lower extremity.  The patient notes the leg continues to be mildly painful with dependency and still swells quite a bite.  Symptoms are much better with elevation.  The patient notes minimal edema in the morning which steadily worsens throughout the day.    The patient has not been using compression therapy at this point.  No SOB or pleuritic chest pains.  No cough or hemoptysis.  No blood per rectum or blood in any sputum.  No excessive bruising per the patient.     No outpatient medications have been marked as taking for the 02/21/18 encounter [facility](Hospital Encounter).    Past Medical History:  Diagnosis Date  . Dyspnea   . Dyspnea on exertion   . Elevated lipids   . Hx of blood clots    legs  . Pulmonary emboli (HCC)   . Pulmonary embolism (HCC) 2011  . Pulmonary embolism (HCC)    2017    Past Surgical History:  Procedure Laterality Date  . APPENDECTOMY     as child  . TONSILLECTOMY AND ADENOIDECTOMY     as child    Social History Social History   Tobacco Use  . Smoking status: Former Smoker    Packs/day: 1.00    Years: 25.00    Pack years: 25.00    Types: Cigarettes    Last attempt to quit: 11/17/2015    Years since quitting: 2.2  . Smokeless tobacco: Never Used  Substance Use Topics  . Alcohol use: Yes    Comment: rare  . Drug use: No    Family History Family History  Problem Relation Age of Onset  . Melanoma Father        face  . Colon cancer Paternal Uncle        died of colon ca - dx at age 55's  . Diabetes Neg Hx      Allergies  Allergen Reactions  . Sulfa Antibiotics Other (See Comments)    As a child-told he had reaction to sulfa drugs.     REVIEW OF SYSTEMS (Negative unless checked)  Constitutional: [] Weight loss  [] Fever  [] Chills Cardiac: [] Chest pain   [] Chest pressure   [] Palpitations   [] Shortness of breath when laying flat   [] Shortness of breath with exertion. Vascular:  [] Pain in legs with walking   [] Pain in legs with dependency [x] History of DVT   [x] Phlebitis   [x] Swelling in legs   [] Varicose veins   [] Non-healing ulcers Pulmonary:   [] Uses home oxygen   [] Productive cough   [] Hemoptysis   [] Wheeze  [] COPD   [] Asthma Neurologic:  [] Dizziness   [] Seizures   [] History of stroke   [] History of TIA  [] Aphasia   [] Vissual changes   [] Weakness or numbness in arm   [] Weakness or numbness in leg Musculoskeletal:   [] Joint swelling   [x] Joint pain   [] Low back pain Hematologic:  [] Easy bruising  [] Easy bleeding   [] Hypercoagulable state   [] Anemic Gastrointestinal:  [] Diarrhea   [] Vomiting  [] Gastroesophageal reflux/heartburn   [] Difficulty swallowing.  Genitourinary:  [] Chronic kidney disease   [] Difficult urination  [] Frequent urination   [] Blood in urine Skin:  [] Rashes   [] Ulcers  Psychological:  [] History of anxiety   []  History of major depression.  Physical Examination  There were no vitals filed for this visit. There is no height or weight on file to calculate BMI. Gen: WD/WN, NAD Head: /AT, No temporalis wasting.  Ear/Nose/Throat: Hearing grossly intact, nares w/o erythema or drainage Eyes: PER, EOMI, sclera nonicteric.  Neck: Supple, no large masses.   Pulmonary:  Good air movement, no audible wheezing bilaterally, no use of accessory muscles.  Cardiac: RRR, no JVD Vascular: scattered varicosities present bilaterally.  Mild venous stasis changes to the legs bilaterally.  2+ soft pitting edema Vessel Right Left  Radial Palpable Palpable  PT Palpable Palpable  DP Palpable  Palpable  Gastrointestinal: Non-distended. No guarding/no peritoneal signs.  Musculoskeletal: M/S 5/5 throughout.  Multiple joints with arthritic deformity no atrophy.  Neurologic: CN 2-12 intact. Symmetrical.  Speech is fluent. Motor exam as listed above. Psychiatric: Judgment intact, Mood & affect appropriate for pt's clinical situation. Dermatologic: venous rashes no ulcers noted.  No changes consistent with cellulitis. Lymph : No lichenification or skin changes of chronic lymphedema.  CBC Lab Results  Component Value Date   WBC 7.8 02/14/2018   HGB 14.8 02/14/2018   HCT 44.0 02/14/2018   MCV 98.4 02/14/2018   PLT 235 02/14/2018    BMET    Component Value Date/Time   NA 140 02/14/2018 1538   K 3.9 02/14/2018 1538   CL 105 02/14/2018 1538   CO2 28 02/14/2018 1538   GLUCOSE 97 02/14/2018 1538   BUN 11 02/14/2018 1538   CREATININE 0.73 02/14/2018 1538   CALCIUM 9.3 02/14/2018 1538   GFRNONAA >60 02/14/2018 1538   GFRAA >60 02/14/2018 1538   Estimated Creatinine Clearance: 134 mL/min (by C-G formula based on SCr of 0.73 mg/dL).  COAG Lab Results  Component Value Date   INR 0.98 02/14/2018   INR 2.4 inr and 29.3 pt 03/14/2017   INR 1.01 11/17/2015    Radiology Ct Knee Left Wo Contrast  Result Date: 02/01/2018 CLINICAL DATA:  Pre Op MY KNEE Planning. Pt complains of worsening bilateral knee swelling and pain due to osteoarthritis. EXAM: CT OF THE LEFT KNEE WITHOUT CONTRAST TECHNIQUE: Multidetector CT imaging of the LEFT knee was performed according to the standard protocol. Multiplanar CT image reconstructions were also generated. COMPARISON:  None. FINDINGS: Bones/Joint/Cartilage The hip demonstrates no fracture or dislocation. There is no lytic or blastic lesion. There is moderate osteoarthritis of the left sacroiliac joint. The knee demonstrates no fracture or dislocation. 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 There is no aggressive lytic or  blastic lesion. There is moderate-severe medial femorotibial compartment joint space narrowing. There is mild lateral femorotibial compartment joint space narrowing. There is moderate patellofemoral compartment joint space narrowing. There is no significant joint effusion. There is no Baker's cyst. The ankle demonstrates no fracture or dislocation. There is no lytic or blastic lesion. 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 There are enthesopathic changes at the Achilles tendon insertion. There is a small plantar calcaneal spur. Ligaments Suboptimally assessed by CT. Muscles and Tendons The muscles are normal. The quadriceps tendon and patellar tendon are intact. Soft tissues There is no fluid collection or hematoma. There is no soft tissue mass. There is peripheral vascular atherosclerotic disease. IMPRESSION: 1. Tricompartmental osteoarthritis as described above. 2.  No acute osseous injury of the left knee. Electronically Signed   By: Elige Ko   On: 02/01/2018 11:18   Ct Knee Right Wo Contrast  Result Date: 02/01/2018 CLINICAL DATA:  Pre Op MY KNEE Planning. Patient complains of worsening bilateral knee swelling and pain due to osteoarthritis. EXAM: CT OF THE RIGHT KNEE WITHOUT CONTRAST TECHNIQUE: Multidetector CT imaging of the RIGHT knee was performed according to the standard protocol. Multiplanar CT image reconstructions were also generated. COMPARISON:  None. FINDINGS: Bones/Joint/Cartilage The hip demonstrates no fracture or dislocation. There is no lytic or blastic lesion. There is moderate osteoarthritis of the right sacroiliac joint. The knee demonstrates no fracture or dislocation. There is no aggressive lytic or blastic lesion. There is moderate-severe medial femorotibial compartment joint space narrowing. There is mild lateral femorotibial compartment joint space narrowing. There is mild patellofemoral compartment joint space narrowing. There is no significant joint effusion. There  is no Baker's cyst. The ankle demonstrates no fracture or dislocation. There is no lytic or blastic lesion. 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 There are enthesopathic changes at the Achilles tendon insertion. There is a small plantar calcaneal spur. Ligaments Suboptimally assessed by CT. Muscles and Tendons The muscles are normal. The quadriceps tendon and patellar tendon are intact. Soft tissues There is no fluid collection or hematoma. There is no soft tissue mass. There is peripheral vascular atherosclerotic disease. IMPRESSION: 1. Tricompartmental osteoarthritis as described above. 2.  No acute osseous injury of the right knee. Electronically Signed   By: Elige Ko   On: 02/01/2018 11:16    Assessment/Plan Degenerative joint disease bilateral knees requiring replacement The patient will continue anticoagulation for now as there have not been any problems or complications at this point.  IVC filter is strongly indicated prior to high risk orthopedic surgery.  Especially given the history of PE with the past DVT.  IVC filter placement will be done the week for surgery. Risk and benefits were reviewed the patient.  Indications for the procedure were reviewed.  All questions were answered, the patient agrees to proceed.   I have had a long discussion with the patient regarding DVT and post phlebitic changes such as swelling and why it  causes symptoms such as pain.  The patient will wear graduated compression stockings class 1 (20-30 mmHg) on a daily basis a prescription was given. The patient will  beginning wearing the stockings first thing in the morning and removing them in the evening. The patient is instructed specifically not to sleep in the stockings.  In addition, behavioral modification including elevation during the day and avoidance of prolonged dependency will be initiated.    The patient will follow-up with me in 3 months after the joint replacement surgery to discuss  removal (this was also discussed today and the patient agrees with the plan to have the filter removed).   Primary hypercoagulable state (HCC) Has seen oncology. We'll continue anticoagulation. A reason for recent superficial thrombophlebitis.  He will be maintained on Eliquis for approximately 3 months post surgery.  Pulmonary embolism, bilateral (HCC) Recent CT scan demonstrates resolution of the previous pulmonary emboli.  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 Recommend compression stockings, elevation, and increasing activity.  Levora Dredge, MD  02/21/2018 1:33 PM

## 2018-02-26 MED ORDER — DEXTROSE 5 % IV SOLN
3.0000 g | Freq: Once | INTRAVENOUS | Status: AC
  Administered 2018-02-27: 3 g via INTRAVENOUS
  Filled 2018-02-26: qty 3

## 2018-02-26 MED ORDER — TRANEXAMIC ACID-NACL 1000-0.7 MG/100ML-% IV SOLN
1000.0000 mg | INTRAVENOUS | Status: AC
  Administered 2018-02-27: 1000 mg via INTRAVENOUS
  Filled 2018-02-26: qty 100

## 2018-02-27 ENCOUNTER — Encounter: Payer: Self-pay | Admitting: Vascular Surgery

## 2018-02-27 ENCOUNTER — Inpatient Hospital Stay
Admission: RE | Admit: 2018-02-27 | Discharge: 2018-03-06 | DRG: 462 | Disposition: A | Discharge status: Home Health | Payer: Self-pay | Attending: Orthopedic Surgery | Admitting: Orthopedic Surgery

## 2018-02-27 ENCOUNTER — Inpatient Hospital Stay: Payer: Self-pay | Admitting: Certified Registered"

## 2018-02-27 ENCOUNTER — Encounter
Admission: RE | Disposition: A | Discharge status: Home Health | Payer: Self-pay | Source: Home / Self Care | Attending: Orthopedic Surgery

## 2018-02-27 ENCOUNTER — Other Ambulatory Visit: Payer: Self-pay

## 2018-02-27 ENCOUNTER — Inpatient Hospital Stay: Payer: Self-pay

## 2018-02-27 DIAGNOSIS — R109 Unspecified abdominal pain: Secondary | ICD-10-CM

## 2018-02-27 DIAGNOSIS — Z882 Allergy status to sulfonamides status: Secondary | ICD-10-CM

## 2018-02-27 DIAGNOSIS — M65862 Other synovitis and tenosynovitis, left lower leg: Secondary | ICD-10-CM | POA: Diagnosis present

## 2018-02-27 DIAGNOSIS — Z87891 Personal history of nicotine dependence: Secondary | ICD-10-CM

## 2018-02-27 DIAGNOSIS — K567 Ileus, unspecified: Secondary | ICD-10-CM | POA: Diagnosis not present

## 2018-02-27 DIAGNOSIS — D62 Acute posthemorrhagic anemia: Secondary | ICD-10-CM | POA: Diagnosis not present

## 2018-02-27 DIAGNOSIS — E860 Dehydration: Secondary | ICD-10-CM | POA: Diagnosis not present

## 2018-02-27 DIAGNOSIS — M170 Bilateral primary osteoarthritis of knee: Principal | ICD-10-CM | POA: Diagnosis present

## 2018-02-27 DIAGNOSIS — M65861 Other synovitis and tenosynovitis, right lower leg: Secondary | ICD-10-CM | POA: Diagnosis present

## 2018-02-27 DIAGNOSIS — Z6841 Body Mass Index (BMI) 40.0 and over, adult: Secondary | ICD-10-CM

## 2018-02-27 DIAGNOSIS — Z95828 Presence of other vascular implants and grafts: Secondary | ICD-10-CM

## 2018-02-27 DIAGNOSIS — G8918 Other acute postprocedural pain: Secondary | ICD-10-CM

## 2018-02-27 DIAGNOSIS — Z79899 Other long term (current) drug therapy: Secondary | ICD-10-CM

## 2018-02-27 DIAGNOSIS — R000 Tachycardia, unspecified: Secondary | ICD-10-CM | POA: Diagnosis not present

## 2018-02-27 DIAGNOSIS — N289 Disorder of kidney and ureter, unspecified: Secondary | ICD-10-CM | POA: Diagnosis not present

## 2018-02-27 DIAGNOSIS — Z808 Family history of malignant neoplasm of other organs or systems: Secondary | ICD-10-CM

## 2018-02-27 DIAGNOSIS — E871 Hypo-osmolality and hyponatremia: Secondary | ICD-10-CM | POA: Diagnosis not present

## 2018-02-27 DIAGNOSIS — Z86718 Personal history of other venous thrombosis and embolism: Secondary | ICD-10-CM

## 2018-02-27 DIAGNOSIS — Z86010 Personal history of colonic polyps: Secondary | ICD-10-CM

## 2018-02-27 DIAGNOSIS — Z96653 Presence of artificial knee joint, bilateral: Secondary | ICD-10-CM

## 2018-02-27 DIAGNOSIS — I959 Hypotension, unspecified: Secondary | ICD-10-CM | POA: Diagnosis not present

## 2018-02-27 DIAGNOSIS — Z800 Family history of malignant neoplasm of digestive organs: Secondary | ICD-10-CM

## 2018-02-27 DIAGNOSIS — Z86711 Personal history of pulmonary embolism: Secondary | ICD-10-CM

## 2018-02-27 HISTORY — PX: TOTAL KNEE ARTHROPLASTY: SHX125

## 2018-02-27 LAB — CBC
HCT: 39.6 % (ref 39.0–52.0)
Hemoglobin: 13.1 g/dL (ref 13.0–17.0)
MCH: 33.2 pg (ref 26.0–34.0)
MCHC: 33.1 g/dL (ref 30.0–36.0)
MCV: 100.3 fL — ABNORMAL HIGH (ref 80.0–100.0)
Platelets: 216 10*3/uL (ref 150–400)
RBC: 3.95 MIL/uL — ABNORMAL LOW (ref 4.22–5.81)
RDW: 12.1 % (ref 11.5–15.5)
WBC: 13.3 10*3/uL — ABNORMAL HIGH (ref 4.0–10.5)
nRBC: 0 % (ref 0.0–0.2)

## 2018-02-27 LAB — CREATININE, SERUM
CREATININE: 0.89 mg/dL (ref 0.61–1.24)
GFR calc Af Amer: >60 mL/min (ref >60)
GFR calc non Af Amer: >60 mL/min (ref >60)

## 2018-02-27 LAB — PROTIME-INR
INR: 1.03
Prothrombin Time: 13.4 seconds (ref 11.4–15.2)

## 2018-02-27 LAB — ABO/RH: ABO/RH(D): A POS

## 2018-02-27 SURGERY — ARTHROPLASTY, KNEE, BILATERAL, TOTAL
Anesthesia: Spinal | Laterality: Bilateral

## 2018-02-27 MED ORDER — PHENYLEPHRINE HCL 10 MG/ML IJ SOLN
INTRAMUSCULAR | Status: AC
  Filled 2018-02-27: qty 1

## 2018-02-27 MED ORDER — PHENOL 1.4 % MT LIQD: 1.0000 | OROMUCOSAL | Status: DC | PRN

## 2018-02-27 MED ORDER — BISACODYL 5 MG PO TBEC
5.0000 mg | DELAYED_RELEASE_TABLET | Freq: Every day | ORAL | Status: DC | PRN
  Administered 2018-03-01: 5 mg via ORAL
  Filled 2018-02-27: qty 1

## 2018-02-27 MED ORDER — PROPOFOL 500 MG/50ML IV EMUL
INTRAVENOUS | Status: AC
  Filled 2018-02-27: qty 50

## 2018-02-27 MED ORDER — LIDOCAINE HCL (PF) 2 % IJ SOLN
INTRAMUSCULAR | Status: AC
  Filled 2018-02-27: qty 10

## 2018-02-27 MED ORDER — BUPIVACAINE HCL (PF) 0.5 % IJ SOLN
INTRAMUSCULAR | Status: AC
  Filled 2018-02-27: qty 10

## 2018-02-27 MED ORDER — OXYCODONE HCL 5 MG PO TABS
5.0000 mg | ORAL_TABLET | ORAL | Status: DC | PRN
  Administered 2018-02-28 – 2018-03-01 (×3): 10 mg via ORAL
  Filled 2018-02-27 (×6): qty 2

## 2018-02-27 MED ORDER — MAGNESIUM CITRATE PO SOLN
1.0000 | Freq: Once | ORAL | Status: AC | PRN
  Administered 2018-03-01: 1 via ORAL
  Filled 2018-02-27: qty 296

## 2018-02-27 MED ORDER — BUPIVACAINE-EPINEPHRINE (PF) 0.25% -1:200000 IJ SOLN
INTRAMUSCULAR | Status: DC | PRN
  Administered 2018-02-27 (×2): 30 mL via PERINEURAL

## 2018-02-27 MED ORDER — ENOXAPARIN SODIUM 40 MG/0.4ML ~~LOC~~ SOLN
40.0000 mg | Freq: Two times a day (BID) | SUBCUTANEOUS | Status: DC
  Administered 2018-02-28 – 2018-03-04 (×9): 40 mg via SUBCUTANEOUS
  Filled 2018-02-27 (×8): qty 0.4

## 2018-02-27 MED ORDER — GABAPENTIN 300 MG PO CAPS
300.0000 mg | ORAL_CAPSULE | Freq: Three times a day (TID) | ORAL | Status: DC
  Administered 2018-02-28 – 2018-03-01 (×6): 300 mg via ORAL
  Filled 2018-02-27 (×6): qty 1

## 2018-02-27 MED ORDER — OXYCODONE HCL 5 MG PO TABS: 5.0000 mg | ORAL_TABLET | Freq: Once | ORAL | Status: DC | PRN

## 2018-02-27 MED ORDER — OXYCODONE HCL 5 MG/5ML PO SOLN: 5.0000 mg | Freq: Once | ORAL | Status: DC | PRN

## 2018-02-27 MED ORDER — METOCLOPRAMIDE HCL 10 MG PO TABS: 5.0000 mg | ORAL_TABLET | Freq: Three times a day (TID) | ORAL | Status: DC | PRN

## 2018-02-27 MED ORDER — FENTANYL CITRATE (PF) 100 MCG/2ML IJ SOLN
INTRAMUSCULAR | Status: DC | PRN
  Administered 2018-02-27 (×4): 25 ug via INTRAVENOUS

## 2018-02-27 MED ORDER — LACTATED RINGERS IV SOLN
INTRAVENOUS | Status: DC
  Administered 2018-02-27 (×3): via INTRAVENOUS

## 2018-02-27 MED ORDER — SIMVASTATIN 20 MG PO TABS
60.0000 mg | ORAL_TABLET | Freq: Every day | ORAL | Status: DC
Start: 2018-02-27 — End: 2018-03-02
  Administered 2018-02-28 – 2018-03-01 (×2): 60 mg via ORAL
  Filled 2018-02-27 (×2): qty 3

## 2018-02-27 MED ORDER — ONDANSETRON HCL 4 MG PO TABS: 4.0000 mg | ORAL_TABLET | Freq: Four times a day (QID) | ORAL | Status: DC | PRN

## 2018-02-27 MED ORDER — MEPERIDINE HCL 50 MG/ML IJ SOLN: 6.2500 mg | INTRAMUSCULAR | Status: DC | PRN

## 2018-02-27 MED ORDER — FENTANYL CITRATE (PF) 100 MCG/2ML IJ SOLN
INTRAMUSCULAR | Status: AC
  Filled 2018-02-27: qty 2

## 2018-02-27 MED ORDER — EPHEDRINE SULFATE 50 MG/ML IJ SOLN
INTRAMUSCULAR | Status: AC
  Filled 2018-02-27: qty 1

## 2018-02-27 MED ORDER — HYDROMORPHONE HCL 1 MG/ML IJ SOLN
0.5000 mg | INTRAMUSCULAR | Status: DC | PRN
  Administered 2018-02-27 – 2018-02-28 (×3): 1 mg via INTRAVENOUS
  Filled 2018-02-27 (×3): qty 1

## 2018-02-27 MED ORDER — FAMOTIDINE 20 MG PO TABS
ORAL_TABLET | ORAL | Status: AC
  Administered 2018-02-27: 20 mg via ORAL
  Filled 2018-02-27: qty 1

## 2018-02-27 MED ORDER — CEFAZOLIN SODIUM-DEXTROSE 2-4 GM/100ML-% IV SOLN
2.0000 g | Freq: Four times a day (QID) | INTRAVENOUS | Status: AC
  Administered 2018-02-27 – 2018-02-28 (×3): 2 g via INTRAVENOUS
  Filled 2018-02-27 (×4): qty 100

## 2018-02-27 MED ORDER — SODIUM CHLORIDE 0.9 % IV SOLN
INTRAVENOUS | Status: DC | PRN
  Administered 2018-02-27: 25 ug/min via INTRAVENOUS

## 2018-02-27 MED ORDER — BUPIVACAINE HCL (PF) 0.5 % IJ SOLN
INTRAMUSCULAR | Status: DC | PRN
  Administered 2018-02-27: 2.5 mL

## 2018-02-27 MED ORDER — SODIUM CHLORIDE 0.9 % IV SOLN
INTRAVENOUS | Status: DC | PRN
  Administered 2018-02-27 (×2): 60 mL

## 2018-02-27 MED ORDER — MAGNESIUM HYDROXIDE 400 MG/5ML PO SUSP
30.0000 mL | Freq: Every day | ORAL | Status: DC | PRN
  Administered 2018-02-28 – 2018-03-03 (×2): 30 mL via ORAL
  Filled 2018-02-27 (×2): qty 30

## 2018-02-27 MED ORDER — KETAMINE HCL 50 MG/ML IJ SOLN
INTRAMUSCULAR | Status: DC | PRN
  Administered 2018-02-27 (×2): 10 mg via INTRAMUSCULAR

## 2018-02-27 MED ORDER — KETAMINE HCL 50 MG/ML IJ SOLN
INTRAMUSCULAR | Status: AC
  Filled 2018-02-27: qty 10

## 2018-02-27 MED ORDER — MIDAZOLAM HCL 2 MG/2ML IJ SOLN
INTRAMUSCULAR | Status: AC
  Filled 2018-02-27: qty 2

## 2018-02-27 MED ORDER — GLYCOPYRROLATE 0.2 MG/ML IJ SOLN
INTRAMUSCULAR | Status: AC
  Filled 2018-02-27: qty 1

## 2018-02-27 MED ORDER — GENTAMICIN SULFATE 40 MG/ML IJ SOLN
INTRAMUSCULAR | Status: AC
  Filled 2018-02-27: qty 6

## 2018-02-27 MED ORDER — MORPHINE SULFATE (PF) 10 MG/ML IV SOLN
INTRAVENOUS | Status: DC | PRN
  Administered 2018-02-27 (×2): 10 mL

## 2018-02-27 MED ORDER — DOCUSATE SODIUM 100 MG PO CAPS
100.0000 mg | ORAL_CAPSULE | Freq: Two times a day (BID) | ORAL | Status: DC
  Administered 2018-02-28 – 2018-03-06 (×11): 100 mg via ORAL
  Filled 2018-02-27 (×11): qty 1

## 2018-02-27 MED ORDER — BUPIVACAINE-EPINEPHRINE (PF) 0.25% -1:200000 IJ SOLN
INTRAMUSCULAR | Status: AC
  Filled 2018-02-27: qty 60

## 2018-02-27 MED ORDER — DIPHENHYDRAMINE HCL 12.5 MG/5ML PO ELIX: 12.5000 mg | ORAL_SOLUTION | ORAL | Status: DC | PRN

## 2018-02-27 MED ORDER — TETRACAINE HCL 1 % IJ SOLN
INTRAMUSCULAR | Status: DC | PRN
  Administered 2018-02-27: 5 mg via INTRASPINAL

## 2018-02-27 MED ORDER — FENTANYL CITRATE (PF) 100 MCG/2ML IJ SOLN
INTRAMUSCULAR | Status: AC
  Administered 2018-02-27: 25 ug via INTRAVENOUS
  Filled 2018-02-27: qty 2

## 2018-02-27 MED ORDER — TETRACAINE HCL 1 % IJ SOLN
INTRAMUSCULAR | Status: AC
  Filled 2018-02-27: qty 2

## 2018-02-27 MED ORDER — ALUM & MAG HYDROXIDE-SIMETH 200-200-20 MG/5ML PO SUSP: 30.0000 mL | ORAL | Status: DC | PRN

## 2018-02-27 MED ORDER — WARFARIN SODIUM 5 MG PO TABS
5.0000 mg | ORAL_TABLET | Freq: Once | ORAL | Status: DC
  Filled 2018-02-27: qty 1

## 2018-02-27 MED ORDER — ACETAMINOPHEN 325 MG PO TABS: 325.0000 mg | ORAL_TABLET | Freq: Four times a day (QID) | ORAL | Status: DC | PRN

## 2018-02-27 MED ORDER — PROPOFOL 500 MG/50ML IV EMUL
INTRAVENOUS | Status: DC | PRN
  Administered 2018-02-27: 50 ug/kg/min via INTRAVENOUS

## 2018-02-27 MED ORDER — ONDANSETRON HCL 4 MG/2ML IJ SOLN
4.0000 mg | Freq: Four times a day (QID) | INTRAMUSCULAR | Status: DC | PRN
  Administered 2018-02-27 – 2018-03-02 (×4): 4 mg via INTRAVENOUS
  Filled 2018-02-27 (×4): qty 2

## 2018-02-27 MED ORDER — MIDAZOLAM HCL 5 MG/5ML IJ SOLN
INTRAMUSCULAR | Status: DC | PRN
  Administered 2018-02-27: 2 mg via INTRAVENOUS

## 2018-02-27 MED ORDER — ZOLPIDEM TARTRATE 5 MG PO TABS: 5.0000 mg | ORAL_TABLET | Freq: Every evening | ORAL | Status: DC | PRN

## 2018-02-27 MED ORDER — MENTHOL 3 MG MT LOZG: 1.0000 | LOZENGE | OROMUCOSAL | Status: DC | PRN

## 2018-02-27 MED ORDER — METHOCARBAMOL 500 MG PO TABS
500.0000 mg | ORAL_TABLET | Freq: Four times a day (QID) | ORAL | Status: DC | PRN
  Administered 2018-03-01: 500 mg via ORAL
  Filled 2018-02-27: qty 1

## 2018-02-27 MED ORDER — GENTAMICIN SULFATE 40 MG/ML IJ SOLN
INTRAMUSCULAR | Status: DC | PRN
  Administered 2018-02-27 (×2): 240 mg

## 2018-02-27 MED ORDER — PROMETHAZINE HCL 25 MG/ML IJ SOLN: 6.2500 mg | INTRAMUSCULAR | Status: DC | PRN

## 2018-02-27 MED ORDER — OXYCODONE HCL 5 MG PO TABS
10.0000 mg | ORAL_TABLET | ORAL | Status: DC | PRN
  Administered 2018-02-28 – 2018-03-01 (×4): 10 mg via ORAL
  Administered 2018-03-02: 5 mg via ORAL
  Filled 2018-02-27 (×2): qty 2

## 2018-02-27 MED ORDER — METHOCARBAMOL 1000 MG/10ML IJ SOLN
500.0000 mg | Freq: Four times a day (QID) | INTRAVENOUS | Status: DC | PRN
  Filled 2018-02-27: qty 5

## 2018-02-27 MED ORDER — METOCLOPRAMIDE HCL 5 MG/ML IJ SOLN: 5.0000 mg | Freq: Three times a day (TID) | INTRAMUSCULAR | Status: DC | PRN

## 2018-02-27 MED ORDER — FAMOTIDINE 20 MG PO TABS
20.0000 mg | ORAL_TABLET | Freq: Once | ORAL | Status: AC
  Administered 2018-02-27: 20 mg via ORAL

## 2018-02-27 MED ORDER — ACETAMINOPHEN 500 MG PO TABS
1000.0000 mg | ORAL_TABLET | Freq: Four times a day (QID) | ORAL | Status: AC
  Administered 2018-02-27 – 2018-02-28 (×2): 1000 mg via ORAL
  Filled 2018-02-27 (×3): qty 2

## 2018-02-27 MED ORDER — FENTANYL CITRATE (PF) 100 MCG/2ML IJ SOLN
50.0000 ug | Freq: Once | INTRAMUSCULAR | Status: AC
  Administered 2018-02-27: 50 ug via INTRAVENOUS

## 2018-02-27 MED ORDER — PROPOFOL 10 MG/ML IV BOLUS
INTRAVENOUS | Status: DC | PRN
  Administered 2018-02-27: 20 mg via INTRAVENOUS
  Administered 2018-02-27: 30 mg via INTRAVENOUS

## 2018-02-27 MED ORDER — TRAMADOL HCL 50 MG PO TABS
50.0000 mg | ORAL_TABLET | Freq: Four times a day (QID) | ORAL | Status: DC
  Administered 2018-02-27 – 2018-03-01 (×3): 50 mg via ORAL
  Filled 2018-02-27 (×3): qty 1

## 2018-02-27 MED ORDER — FENTANYL CITRATE (PF) 100 MCG/2ML IJ SOLN
25.0000 ug | INTRAMUSCULAR | Status: AC | PRN
  Administered 2018-02-27 (×6): 25 ug via INTRAVENOUS

## 2018-02-27 MED ORDER — LIDOCAINE HCL (CARDIAC) PF 100 MG/5ML IV SOSY
PREFILLED_SYRINGE | INTRAVENOUS | Status: DC | PRN
  Administered 2018-02-27: 60 mg via INTRAVENOUS

## 2018-02-27 MED ORDER — SODIUM CHLORIDE 0.9 % IV SOLN
INTRAVENOUS | Status: DC
  Administered 2018-02-27 – 2018-03-02 (×3): via INTRAVENOUS

## 2018-02-27 SURGICAL SUPPLY — 84 items
BANDAGE ACE 6X5 VEL STRL LF (GAUZE/BANDAGES/DRESSINGS) ×6 IMPLANT
BANDAGE ELASTIC 4 LF NS (GAUZE/BANDAGES/DRESSINGS) ×3 IMPLANT
BLADE SAW 1 (BLADE) ×6 IMPLANT
BLADE SURG SZ10 CARB STEEL (BLADE) ×6 IMPLANT
BLOCK CUTTING FEMUR 4+ (MISCELLANEOUS) ×3 IMPLANT
BLOCK CUTTING FEMUR 4+ LT MED (MISCELLANEOUS) ×3 IMPLANT
BLOCK CUTTING TIBIAL 4 (MISCELLANEOUS) ×3 IMPLANT
BLOCK CUTTING TIBIAL 4 MEDAC (MISCELLANEOUS) ×3 IMPLANT
BLOCK CUTTING TIBIAL 4 RT MED (MISCELLANEOUS) ×3 IMPLANT
BNDG COHESIVE 6X5 TAN STRL LF (GAUZE/BANDAGES/DRESSINGS) ×3 IMPLANT
CANISTER SUCT 1200ML W/VALVE (MISCELLANEOUS) ×3 IMPLANT
CANISTER SUCT 3000ML PPV (MISCELLANEOUS) ×6 IMPLANT
CEMENT FEMORAL COMP SZ 4+ RT (Femur) ×3 IMPLANT
CEMENT HV SMART SET (Cement) ×12 IMPLANT
CHLORAPREP W/TINT 26ML (MISCELLANEOUS) ×6 IMPLANT
COOLER POLAR GLACIER W/PUMP (MISCELLANEOUS) ×6 IMPLANT
COVER WAND RF STERILE (DRAPES) ×3 IMPLANT
CUFF TOURN 24 STER (MISCELLANEOUS) IMPLANT
CUFF TOURN 30 STER DUAL PORT (MISCELLANEOUS) ×6 IMPLANT
DRAPE EXTREMITY 106X87X128.5 (DRAPES) ×3 IMPLANT
DRAPE IMP U-DRAPE 54X76 (DRAPES) ×3 IMPLANT
DRAPE INCISE IOBAN 66X45 STRL (DRAPES) ×3 IMPLANT
DRAPE SHEET LG 3/4 BI-LAMINATE (DRAPES) ×12 IMPLANT
DRAPE U-SHAPE 47X51 STRL (DRAPES) ×3 IMPLANT
ELECT CAUTERY BLADE 6.4 (BLADE) ×6 IMPLANT
ELECT REM PT RETURN 9FT ADLT (ELECTROSURGICAL) ×3
ELECTRODE REM PT RTRN 9FT ADLT (ELECTROSURGICAL) ×1 IMPLANT
FEMORAL COMP SZ4P LT SPHERE (Femur) ×3 IMPLANT
FEMUR BONE MODEL (MISCELLANEOUS) ×3 IMPLANT
FEMUR BONE MODEL 4.9010 MEDACT (MISCELLANEOUS) ×3 IMPLANT
FEMUR BONE MODEL 4.9040 MEDACT (MISCELLANEOUS) ×3 IMPLANT
GAUZE PETRO XEROFOAM 1X8 (MISCELLANEOUS) ×6 IMPLANT
GAUZE SPONGE 4X4 12PLY STRL (GAUZE/BANDAGES/DRESSINGS) ×6 IMPLANT
GLOVE BIOGEL PI IND STRL 9 (GLOVE) ×2 IMPLANT
GLOVE BIOGEL PI INDICATOR 9 (GLOVE) ×4
GLOVE INDICATOR 8.0 STRL GRN (GLOVE) ×6 IMPLANT
GLOVE SURG ORTHO 8.0 STRL STRW (GLOVE) ×6 IMPLANT
GLOVE SURG SYN 9.0  PF PI (GLOVE) ×4
GLOVE SURG SYN 9.0 PF PI (GLOVE) ×2 IMPLANT
GOWN SRG 2XL LVL 4 RGLN SLV (GOWNS) ×2 IMPLANT
GOWN STRL NON-REIN 2XL LVL4 (GOWNS) ×4
GOWN STRL REUS W/ TWL LRG LVL3 (GOWN DISPOSABLE) ×2 IMPLANT
GOWN STRL REUS W/ TWL XL LVL3 (GOWN DISPOSABLE) ×2 IMPLANT
GOWN STRL REUS W/TWL LRG LVL3 (GOWN DISPOSABLE) ×4
GOWN STRL REUS W/TWL XL LVL3 (GOWN DISPOSABLE) ×4
HANDLE YANKAUER SUCT BULB TIP (MISCELLANEOUS) ×3 IMPLANT
HOLDER FOLEY CATH W/STRAP (MISCELLANEOUS) ×3 IMPLANT
HOOD PEEL AWAY FLYTE STAYCOOL (MISCELLANEOUS) ×6 IMPLANT
INSERT TIBIAL SZ4 RIGHT 10MM (Insert) ×3 IMPLANT
KIT TURNOVER KIT A (KITS) ×3 IMPLANT
KNIFE SCULPS 14X20 (INSTRUMENTS) ×3 IMPLANT
NDL SAFETY ECLIPSE 18X1.5 (NEEDLE) ×1 IMPLANT
NEEDLE HYPO 18GX1.5 SHARP (NEEDLE) ×2
NEEDLE SPNL 18GX3.5 QUINCKE PK (NEEDLE) ×6 IMPLANT
NEEDLE SPNL 20GX3.5 QUINCKE YW (NEEDLE) ×6 IMPLANT
NS IRRIG 1000ML POUR BTL (IV SOLUTION) ×3 IMPLANT
PACK TOTAL KNEE (MISCELLANEOUS) ×3 IMPLANT
PAD WRAPON POLAR KNEE (MISCELLANEOUS) ×2 IMPLANT
PATELLA RESURFACING MEDACTA SZ (Bone Implant) ×6 IMPLANT
PULSAVAC PLUS IRRIG FAN TIP (DISPOSABLE) ×3
SCALPEL PROTECTED #10 DISP (BLADE) ×12 IMPLANT
SOL .9 NS 3000ML IRR  AL (IV SOLUTION) ×2
SOL .9 NS 3000ML IRR UROMATIC (IV SOLUTION) ×1 IMPLANT
STAPLER SKIN PROX 35W (STAPLE) ×6 IMPLANT
STEM EXTENSION 11MMX30MM (Stem) ×3 IMPLANT
STOCKINETTE IMPERV 14X48 (MISCELLANEOUS) ×3 IMPLANT
STRAP SAFETY 5IN WIDE (MISCELLANEOUS) ×3 IMPLANT
SUCTION FRAZIER HANDLE 10FR (MISCELLANEOUS) ×4
SUCTION TUBE FRAZIER 10FR DISP (MISCELLANEOUS) ×2 IMPLANT
SUT DVC 2 QUILL PDO  T11 36X36 (SUTURE) ×4
SUT DVC 2 QUILL PDO T11 36X36 (SUTURE) ×2 IMPLANT
SUT ETHIBOND NAB CT1 #1 30IN (SUTURE) ×6 IMPLANT
SUT V-LOC 90 ABS DVC 3-0 CL (SUTURE) ×6 IMPLANT
SYR 20CC LL (SYRINGE) ×3 IMPLANT
SYR 50ML LL SCALE MARK (SYRINGE) ×6 IMPLANT
TIBIAL INSERT SZ4 LT 02120410F (Insert) ×3 IMPLANT
TIBIAL TRAY FIXED 4 MEDACTA 02 (Joint) ×3 IMPLANT
TIBIAL TRAY FIXED MEDACTA 0207 (Bone Implant) ×3 IMPLANT
TIP FAN IRRIG PULSAVAC PLUS (DISPOSABLE) ×1 IMPLANT
TIP FEMORAL FAN SPRAY W/SHIELD (MISCELLANEOUS) ×3 IMPLANT
TOWEL OR 17X26 4PK STRL BLUE (TOWEL DISPOSABLE) ×3 IMPLANT
TOWER CARTRIDGE SMART MIX (DISPOSABLE) ×6 IMPLANT
TRAY FOLEY MTR SLVR 16FR STAT (SET/KITS/TRAYS/PACK) ×3 IMPLANT
WRAPON POLAR PAD KNEE (MISCELLANEOUS) ×6

## 2018-02-27 NOTE — Transfer of Care (Addendum)
Immediate Anesthesia Transfer of Care Note  Patient: Frank MayerGary A Rivera  Procedure(s) Performed: TOTAL KNEE BILATERAL (Bilateral )  Patient Location: PACU  Anesthesia Type:Spinal  Level of Consciousness: awake, alert , oriented and patient cooperative  Airway & Oxygen Therapy: Patient Spontanous Breathing  Post-op Assessment: BP low given ephedrine 10 mg. HR 51 given 0.2 mg robinul. Dr. Pernell DupreAdams notified.  Post vital signs: Reviewed and stable  Last Vitals:  Vitals Value Taken Time  BP    Temp    Pulse 63 02/27/2018  7:46 PM  Resp 12 02/27/2018  7:46 PM  SpO2 97 % 02/27/2018  7:46 PM  Vitals shown include unvalidated device data.  Last Pain:  Vitals:   02/27/18 1234  TempSrc: Tympanic  PainSc: 0-No pain         Complications: No apparent anesthesia complications

## 2018-02-27 NOTE — Anesthesia Post-op Follow-up Note (Signed)
Anesthesia QCDR form completed.        

## 2018-02-27 NOTE — H&P (Signed)
Reviewed paper H+P, will be scanned into chart. No changes noted.  

## 2018-02-27 NOTE — Progress Notes (Signed)
Patient arrived from PACU. VSS. Drainage from right knee. Dr Rosita Keamenz notified, reinforced dressing. Bone foams applied to both legs

## 2018-02-27 NOTE — Progress Notes (Signed)
ANTICOAGULATION CONSULT NOTE - Initial Consult  Pharmacy Consult for warfarin therapy post TKR Indication: DVT w/o PE   Allergies  Allergen Reactions  . Sulfa Antibiotics Other (See Comments)    As a child-told he had reaction to sulfa drugs.    Vital Signs: Temp: 98.1 F (36.7 C) (12/03 2321) Temp Source: Oral (12/03 2321) BP: 124/72 (12/03 2321) Pulse Rate: 94 (12/03 2321)  Labs: Recent Labs    02/27/18 2201  HGB 13.1  HCT 39.6  PLT 216  LABPROT 13.4  INR 1.03  CREATININE 0.89    Estimated Creatinine Clearance: 120.4 mL/min (by C-G formula based on SCr of 0.89 mg/dL).   Medical History: Past Medical History:  Diagnosis Date  . Dyspnea   . Dyspnea on exertion   . Elevated lipids   . Hx of blood clots    legs  . Pulmonary emboli (HCC)   . Pulmonary embolism (HCC) 2011  . Pulmonary embolism (HCC)    2017    Medications:  Scheduled:  . [START ON 02/28/2018] acetaminophen  1,000 mg Oral Q6H  . docusate sodium  100 mg Oral BID  . [START ON 02/28/2018] enoxaparin (LOVENOX) injection  40 mg Subcutaneous Q12H  . ePHEDrine      . gabapentin  300 mg Oral TID  . glycopyrrolate      . simvastatin  60 mg Oral QHS  . [START ON 02/28/2018] traMADol  50 mg Oral Q6H  . [START ON 02/28/2018] warfarin  5 mg Oral Once    Assessment: Patient presents for pain in the lower extremities w/ a h/o DVT and subsequent PE identified a few years ago and was placed on anticoagulation; now presents with thrombophlebitis and placement of IVC filter and bilateral total knee replacements. Orthopedic would like to restart patient's warfarin. Patient was apparently on warfarin PTA historically (was discontinued due to patient preference per med rec history): Warfarin 2.5 mg daily (was taking half of a 5 mg tablet)  12/03 INR 1.03 subtherapeutic  Goal of Therapy:  INR 2-3 Monitor platelets by anticoagulation protocol: Yes   Plan:  Will give patient a dose of warfarin 5 mg x 1. Will  plan to place patient back on a regimen of 2.5 mg daily. Will monitor and adjust per daily INRs Will monitor daily CBCs.  Thomasene Rippleavid Reis Pienta, PharmD, BCPS Clinical Pharmacist 02/28/2018

## 2018-02-27 NOTE — Anesthesia Preprocedure Evaluation (Signed)
Anesthesia Evaluation  Patient identified by MRN, date of birth, ID band Patient awake    Reviewed: Allergy & Precautions, NPO status , Patient's Chart, lab work & pertinent test results  History of Anesthesia Complications Negative for: history of anesthetic complications  Airway Mallampati: II  TM Distance: >3 FB Neck ROM: Full    Dental no notable dental hx.    Pulmonary neg sleep apnea, neg COPD, former smoker,    breath sounds clear to auscultation- rhonchi (-) wheezing      Cardiovascular (-) hypertension+ DVT  (-) CAD, (-) Past MI, (-) Cardiac Stents and (-) CABG  Rhythm:Regular Rate:Normal - Systolic murmurs and - Diastolic murmurs    Neuro/Psych neg Seizures negative neurological ROS  negative psych ROS   GI/Hepatic negative GI ROS, Neg liver ROS,   Endo/Other  negative endocrine ROSneg diabetes  Renal/GU negative Renal ROS     Musculoskeletal negative musculoskeletal ROS (+)   Abdominal (+) + obese,   Peds  Hematology negative hematology ROS (+)   Anesthesia Other Findings Past Medical History: No date: Dyspnea No date: Dyspnea on exertion No date: Elevated lipids No date: Hx of blood clots     Comment:  legs No date: Pulmonary emboli (HCC) 2011: Pulmonary embolism (HCC) No date: Pulmonary embolism (HCC)     Comment:  2017   Reproductive/Obstetrics                             Lab Results  Component Value Date   WBC 7.8 02/14/2018   HGB 14.8 02/14/2018   HCT 44.0 02/14/2018   MCV 98.4 02/14/2018   PLT 235 02/14/2018    Anesthesia Physical Anesthesia Plan  ASA: II  Anesthesia Plan: Spinal   Post-op Pain Management:    Induction:   PONV Risk Score and Plan: 1 and Propofol infusion  Airway Management Planned: Natural Airway  Additional Equipment:   Intra-op Plan:   Post-operative Plan:   Informed Consent: I have reviewed the patients History and  Physical, chart, labs and discussed the procedure including the risks, benefits and alternatives for the proposed anesthesia with the patient or authorized representative who has indicated his/her understanding and acceptance.   Dental advisory given  Plan Discussed with: CRNA and Anesthesiologist  Anesthesia Plan Comments:         Anesthesia Quick Evaluation

## 2018-02-27 NOTE — Op Note (Signed)
02/27/2018  7:45 PM  PATIENT:  Frank Rivera  57 y.o. male  PRE-OPERATIVE DIAGNOSIS:  BILATERAL PRIMARY OSTEOARTHRITIS OF KNEE  POST-OPERATIVE DIAGNOSIS:  BILATERAL PRIMARY OSTEOARTHRITIS OF KNEE  PROCEDURE:  Procedure(s): TOTAL KNEE BILATERAL (Bilateral)  SURGEON: Leitha SchullerMichael J Ngoc Daughtridge, MD  ASSISTANTS: Cranston Neighborhris Gaines, PA-C  ANESTHESIA:   spinal  EBL:  Total I/O In: -  Out: 100 [Urine:100]  BLOOD ADMINISTERED:none  DRAINS: none   LOCAL MEDICATIONS USED:  MARCAINE    and OTHER morphine and Exparel  SPECIMEN:  No Specimen  DISPOSITION OF SPECIMEN:  N/A  COUNTS:  YES  TOURNIQUET: Right knee 72 METS at 300 mmHg left knee approximately the same  IMPLANTS: Medacta GMK sphere system, right knee: 4+  right femur, 4 tibia with tibial stem.  3 patella, 10 mm flex insert.  Left knee: 4 + left femur, 4 left tibia with short stem and 10 mm insert with 3 patella, all components cemented  DICTATION: .Dragon Dictation   Patient was brought to the operating room and after adequate spinal anesthesia was obtainedboth legs were prepped and draped in thesterile fashion with tourniquet applied to theupper thigh. After patient identification and timeout procedure was completed, the right leg was addressed first with thetourniquet was raised and midline skin incision was made followed by medial parapatellar arthrotomy. There was significant medial and patellofemoral degenerative change with less extensive degenerative changes to the lateral compartmenttere is also synovitis present within the joint which was excised. The ACL, PCLand fat pad were excised as well. The proximal tibia was exposed and themy knee cutting guide was applied.  Proximal tibia cut carried out.Distal femoral cutting guide applied in a similar fashion and distal femoral cut carried outThe femur was then prepared withsize4+ cutting guide withanterior, posterior and chamfer cuts made with appropriate size cuts and this  appeared to fit well the femoraltrial was placed distal drill holes made followed by the trochlear groove cut.. The tibia was prepared using the tibial template baseplate size 4,this was pinned into position proximal tibial preparation carried out for short stem. A 10mm insert gave excellent stability and full extension. The trials were removed at this point and the bony surfaces thoroughly irrigated and dried after the patellar cutting been carried out with the patellar cutting guide drill holes made and sized to a size 3. The above local was then infiltrated in the periarticular tissues and the bony surfaces dried the tibial component was cemented to place first with excess cement removed followed by the polyethylene component with the set screw and a torque screwdriver. The femoral component was placed in the knee held in extension as the cement set with the patellar button clamped into place. After the cement hadset,excess cement was removed and the knee was thoroughly again thoroughly irrigated with tourniquet let down. The arthrotomy was then repaired using heavy Manson AllanQuill,.3-0 v-locsubcuticular followed by skin staples.Woundwas then covered with Xeroform 4 x 4's webril and a bias wrap.  The identical procedure was then carried out in the left knee with same size components just left rather than right and similar synovitis.  After this wound was closed the wound was covered with Xeroform 4 x 4's web roll Polar Care and Ace wrap and then the bias wrap was removed from the right knee and Polar Care and Ace wrap applied  PLAN OF CARE: Admit to inpatient   PATIENT DISPOSITION:  PACU - hemodynamically stable.

## 2018-02-27 NOTE — Progress Notes (Signed)
Anticoagulation monitoring(Lovenox):  57yo  male ordered Lovenox 30 mg Q12h  There were no vitals filed for this visit. BMI 41.5   Lab Results  Component Value Date   CREATININE 0.73 02/14/2018   CREATININE 0.76 11/21/2015   CREATININE 0.69 11/20/2015   Estimated Creatinine Clearance: 134 mL/min (by C-G formula based on SCr of 0.73 mg/dL). Hemoglobin & Hematocrit     Component Value Date/Time   HGB 14.8 02/14/2018 1538   HCT 44.0 02/14/2018 1538     Per Protocol for Patient with estCrcl > 30 ml/min and BMI > 40, will transition to Lovenox 40 mg Q12h.

## 2018-02-27 NOTE — Anesthesia Procedure Notes (Signed)
Spinal  Patient location during procedure: OR Start time: 02/27/2018 4:05 PM End time: 02/27/2018 4:11 PM Staffing Anesthesiologist: Molli Barrows, MD Resident/CRNA: Dionne Bucy, CRNA Performed: resident/CRNA  Preanesthetic Checklist Completed: patient identified, site marked, surgical consent, pre-op evaluation, timeout performed, IV checked, risks and benefits discussed and monitors and equipment checked Spinal Block Patient position: sitting Prep: ChloraPrep Patient monitoring: heart rate, continuous pulse ox, blood pressure and cardiac monitor Approach: midline Location: L4-5 Injection technique: single-shot Needle Needle type: Introducer and Pencan  Needle gauge: 24 G Needle length: 10 cm Assessment Sensory level: T6 Additional Notes Negative paresthesia. Negative blood return. Positive free-flowing CSF. Expiration date of kit checked and confirmed. Patient tolerated procedure well, without complications.

## 2018-02-28 ENCOUNTER — Encounter: Payer: Self-pay | Admitting: Orthopedic Surgery

## 2018-02-28 LAB — BASIC METABOLIC PANEL
Anion gap: 7 (ref 5–15)
BUN: 12 mg/dL (ref 6–20)
CHLORIDE: 103 mmol/L (ref 98–111)
CO2: 29 mmol/L (ref 22–32)
Calcium: 8.5 mg/dL — ABNORMAL LOW (ref 8.9–10.3)
Creatinine, Ser: 0.95 mg/dL (ref 0.61–1.24)
GFR calc Af Amer: >60 mL/min (ref >60)
GFR calc non Af Amer: >60 mL/min (ref >60)
Glucose, Bld: 171 mg/dL — ABNORMAL HIGH (ref 70–99)
Potassium: 4.8 mmol/L (ref 3.5–5.1)
Sodium: 139 mmol/L (ref 135–145)

## 2018-02-28 LAB — CBC
HCT: 37.1 % — ABNORMAL LOW (ref 39.0–52.0)
Hemoglobin: 12.2 g/dL — ABNORMAL LOW (ref 13.0–17.0)
MCH: 32.9 pg (ref 26.0–34.0)
MCHC: 32.9 g/dL (ref 30.0–36.0)
MCV: 100 fL (ref 80.0–100.0)
Platelets: 217 10*3/uL (ref 150–400)
RBC: 3.71 MIL/uL — ABNORMAL LOW (ref 4.22–5.81)
RDW: 12.3 % (ref 11.5–15.5)
WBC: 12.5 10*3/uL — ABNORMAL HIGH (ref 4.0–10.5)
nRBC: 0 % (ref 0.0–0.2)

## 2018-02-28 LAB — PROTIME-INR
INR: 1.09
Prothrombin Time: 14 seconds (ref 11.4–15.2)

## 2018-02-28 MED ORDER — WARFARIN SODIUM 2.5 MG PO TABS
2.5000 mg | ORAL_TABLET | Freq: Every day | ORAL | Status: DC
  Administered 2018-02-28 – 2018-03-01 (×2): 2.5 mg via ORAL
  Filled 2018-02-28 (×2): qty 1

## 2018-02-28 NOTE — Progress Notes (Signed)
PT Cancellation Note  Patient Details Name: Frank Rivera MRN: [id number] DOB: 04/12/1960   Cancelled Treatment:    Reason Eval/Treat Not Completed: Other (comment)(Pt presenting with emesis at start of session, PT will follow up with pt this AM. )   Olga Coasteriana Cyndy Braver PT, DPT 9:01 AM,02/28/18 [phone]

## 2018-02-28 NOTE — Progress Notes (Signed)
   Subjective: 1 Day Post-Op Procedure(s) (LRB): TOTAL KNEE BILATERAL (Bilateral) Patient reports pain as 9 on 0-10 scale.   Patient is well, and has had no acute complaints or problems Denies any CP, SOB, ABD pain. We will start physical therapy today.  Plan is to go Skilled nursing facility after hospital stay.  Objective: Vital signs in last 24 hours: Temp:  [96.8 F (36 C)-98.5 F (36.9 C)] 97.5 F (36.4 C) (12/04 0802) Pulse Rate:  [53-116] 100 (12/04 0802) Resp:  [7-20] 18 (12/04 0802) BP: (70-170)/(37-96) 154/85 (12/04 0802) SpO2:  [86 %-100 %] 97 % (12/04 0802)  Intake/Output from previous day: 12/03 0701 - 12/04 0700 In: 2488.3 [P.O.:180; I.V.:2252.6; IV Piggyback:55.6] Out: 2000 [Urine:1900; Blood:100] Intake/Output this shift: No intake/output data recorded.  Recent Labs    02/27/18 2201 02/28/18 0411  HGB 13.1 12.2*   Recent Labs    02/27/18 2201 02/28/18 0411  WBC 13.3* 12.5*  RBC 3.95* 3.71*  HCT 39.6 37.1*  PLT 216 217   Recent Labs    02/27/18 2201 02/28/18 0411  NA  --  139  K  --  4.8  CL  --  103  CO2  --  29  BUN  --  12  CREATININE 0.89 0.95  GLUCOSE  --  171*  CALCIUM  --  8.5*   Recent Labs    02/27/18 2201 02/28/18 0411  INR 1.03 1.09    EXAM General - Patient is Alert, Appropriate and Oriented Bilateral Extremities - Neurovascular intact Sensation intact distally Intact pulses distally Dorsiflexion/Plantar flexion intact No cellulitis present Compartment soft Dressing - dressing C/D/I and right kneee with moderate drainage/stable. Motor Function - intact, moving left and right feet and toes well on exam.   Past Medical History:  Diagnosis Date  . Dyspnea   . Dyspnea on exertion   . Elevated lipids   . Hx of blood clots    legs  . Pulmonary emboli (HCC)   . Pulmonary embolism (HCC) 2011  . Pulmonary embolism (HCC)    2017    Assessment/Plan:   1 Day Post-Op Procedure(s) (LRB): TOTAL KNEE BILATERAL  (Bilateral) Active Problems:   S/P TKR (total knee replacement) using cement, bilateral  Estimated body mass index is 41.66 kg/m as calculated from the following:   Height as of 02/21/18: 5\' 8"  (1.727 m).   Weight as of 02/21/18: 124.3 kg. Advance diet Up with therapy  Needs BM continue with current pain regimen Labs are stable Continue bridging with Lovenox.  Recheck INR in the morning Vital signs are stable Care management to assist with discharge to skilled nursing facility  DVT Prophylaxis - Lovenox, Coumadin, TED hose and SCDs Weight-Bearing as tolerated to left and right leg   T. Cranston Neighborhris Georgeana Oertel, PA-C [facility] 02/28/2018, 8:03 AM

## 2018-02-28 NOTE — Evaluation (Addendum)
Physical Therapy Evaluation Patient Details Name: Frank MayerGary A Rivera MRN: [id number] DOB: 07/16/1960 Today's Date: 02/28/2018   History of Present Illness  Pt is 57 yo male s/p bilateral TKA, WBAT. PMH of DVT and PE, IVC filter placed 02/21/18, former smoker, dypnea on exertion.   Clinical Impression  Patient agreeable to PT, complained of bilateral knee pain, 6/10 R>L. Pt also still has complaints of nausea, though improved from earlier and has had medication. O2 via [place] removed with nursing consent, spO2 stable throughout session. Pt reported prior to admission independent in 2 story home (able to stay on first floor) with 3 STE. Stated he has family available 24/7 to assist if needed.   Patient needed AAROM for all exercises today, except quad sets but tactile cues still needed. Exhibited difficulty due to pain/poor muscle contraction with all exercises, unable to perform SLR bilaterally without assistance. ROM needs further assessment, but R extension: -9 deg, L extension: -10. Pt mobilized to EOB with maxAx2. Sit <> stand attempted x2 with RW, maxAx2, and elevated surface, unable to achieve full standing position to allow transfer to chair. Pt returned to bed with all needs in reach. The patient demonstrated limitations (see "PT problem list") that significantly limit pt's ability to perform functional activities. The patient would benefit from skilled PT to address these deficits to return pt to PLOF as appropriate. Recommendation is STR due to level of assistance needed for mobility and inaccessible home environment.     Follow Up Recommendations SNF    Equipment Recommendations  Rolling walker with 5" wheels;3in1 (PT);Other (comment)(TBD, further equipment may be needed depending on discharge disposition)    Recommendations for Other Services       Precautions / Restrictions Precautions Precautions: Knee Precaution Booklet Issued: Yes (comment) Restrictions Weight Bearing Restrictions:  Yes RLE Weight Bearing: Weight bearing as tolerated LLE Weight Bearing: Weight bearing as tolerated      Mobility  Bed Mobility Overal bed mobility: Needs Assistance Bed Mobility: Supine to Sit;Sit to Supine     Supine to sit: Mod assist;+2 for physical assistance Sit to supine: Mod assist;+2 for physical assistance      Transfers Overall transfer level: Needs assistance Equipment used: Rolling walker (2 wheeled) Transfers: Sit to/from Stand Sit to Stand: Max assist;+2 physical assistance         General transfer comment: attempted x2, second attempt improved with more knee flexion bilaterally prior to standing. pt complained of significant pain. Unable to achieve fully standing both attempts  Ambulation/Gait             General Gait Details: deferred  Stairs            Wheelchair Mobility    Modified Rankin (Stroke Patients Only)       Balance Overall balance assessment: Needs assistance Sitting-balance support: Feet supported Sitting balance-Leahy Scale: Fair       Standing balance-Leahy Scale: Zero                               Pertinent Vitals/Pain Pain Assessment: 0-10 Pain Score: 6  Pain Location: both knees Pain Descriptors / Indicators: Sharp;Sore;Tightness Pain Intervention(s): Limited activity within patient's tolerance;Monitored during session;Ice applied;Premedicated before session;Repositioned    Home Living Family/patient expects to be discharged to:: Private residence Living Arrangements: Spouse/significant other Available Help at Discharge: Family;Available 24 hours/day Type of Home: House Home Access: Stairs to enter Entrance Stairs-Rails: None Entrance Stairs-Number of Steps:  3 Home Layout: Two level;Able to live on main level with bedroom/bathroom Home Equipment: Grab bars - toilet;Grab bars - tub/shower;Shower seat      Prior Function Level of Independence: Independent               Hand  Dominance        Extremity/Trunk Assessment   Upper Extremity Assessment Upper Extremity Assessment: Overall WFL for tasks assessed    Lower Extremity Assessment Lower Extremity Assessment: Generalized weakness       Communication   Communication: No difficulties  Cognition Arousal/Alertness: Awake/alert Behavior During Therapy: WFL for tasks assessed/performed Overall Cognitive Status: Within Functional Limits for tasks assessed                                        General Comments      Exercises Total Joint Exercises Ankle Circles/Pumps: AROM;Both;20 reps Quad Sets: AROM;Both;Strengthening;10 reps Gluteal Sets: AROM;Strengthening;Both;15 reps Heel Slides: AAROM;Both;10 reps Hip ABduction/ADduction: AAROM;Both;10 reps Straight Leg Raises: AAROM;Both;10 reps Goniometric ROM: knee extension R: 9deg, knee extension L: 10deg, needs further assessment of R & L knee flexion   Assessment/Plan    PT Assessment Patient needs continued PT services  PT Problem List Decreased strength;Decreased range of motion;Decreased knowledge of use of DME;Decreased activity tolerance;Decreased balance;Pain;Decreased mobility       PT Treatment Interventions DME instruction;Balance training;Gait training;Neuromuscular re-education;Stair training;Functional mobility training;Patient/family education;Therapeutic activities;Therapeutic exercise    PT Goals (Current goals can be found in the Care Plan section)  Acute Rehab PT Goals Patient Stated Goal: Pt wants to go home PT Goal Formulation: With patient/family Time For Goal Achievement: 03/14/18 Potential to Achieve Goals: Fair    Frequency BID   Barriers to discharge Inaccessible home environment      Co-evaluation               AM-PAC PT "6 Clicks" Mobility  Outcome Measure Help needed turning from your back to your side while in a flat bed without using bedrails?: A Lot Help needed moving from lying on  your back to sitting on the side of a flat bed without using bedrails?: A Lot Help needed moving to and from a bed to a chair (including a wheelchair)?: Total Help needed standing up from a chair using your arms (e.g., wheelchair or bedside chair)?: Total Help needed to walk in hospital room?: Total Help needed climbing 3-5 steps with a railing? : Total 6 Click Score: 8    End of Session Equipment Utilized During Treatment: Gait belt Activity Tolerance: Patient limited by pain;Patient limited by fatigue;Other (comment)(nausea) Patient left: in bed;with call bell/phone within reach;with bed alarm set;with SCD's reapplied Nurse Communication: Mobility status PT Visit Diagnosis: Unsteadiness on feet (R26.81);Difficulty in walking, not elsewhere classified (R26.2);Other abnormalities of gait and mobility (R26.89);Muscle weakness (generalized) (M62.81);Pain Pain - Right/Left: Left(R knee as well) Pain - part of body: Knee    Time: 5409-8119 PT Time Calculation (min) (ACUTE ONLY): 49 min   Charges:   PT Evaluation $PT Eval Low Complexity: 1 Low PT Treatments $Therapeutic Exercise: 8-22 mins $Therapeutic Activity: 23-37 mins        Olga Coaster PT, DPT 1:15 PM,02/28/18 [phone]

## 2018-02-28 NOTE — Progress Notes (Signed)
ANTICOAGULATION CONSULT NOTE - Initial Consult  Pharmacy Consult for warfarin therapy post TKR Indication: DVT w/o PE   Allergies  Allergen Reactions  . Sulfa Antibiotics Other (See Comments)    As a child-told he had reaction to sulfa drugs.    Vital Signs: Temp: 98.4 F (36.9 C) (12/04 0325) Temp Source: Oral (12/03 2321) BP: 145/87 (12/04 0325) Pulse Rate: 116 (12/04 0325)  Labs: Recent Labs    02/27/18 2201 02/28/18 0411  HGB 13.1 12.2*  HCT 39.6 37.1*  PLT 216 217  LABPROT 13.4 14.0  INR 1.03 1.09  CREATININE 0.89 0.95    Estimated Creatinine Clearance: 112.8 mL/min (by C-G formula based on SCr of 0.95 mg/dL).   Medical History: Past Medical History:  Diagnosis Date  . Dyspnea   . Dyspnea on exertion   . Elevated lipids   . Hx of blood clots    legs  . Pulmonary emboli (HCC)   . Pulmonary embolism (HCC) 2011  . Pulmonary embolism (HCC)    2017    Medications:  Scheduled:  . acetaminophen  1,000 mg Oral Q6H  . docusate sodium  100 mg Oral BID  . enoxaparin (LOVENOX) injection  40 mg Subcutaneous Q12H  . ePHEDrine      . gabapentin  300 mg Oral TID  . glycopyrrolate      . simvastatin  60 mg Oral QHS  . traMADol  50 mg Oral Q6H  . warfarin  2.5 mg Oral q1800    Assessment: Patient presents for pain in the lower extremities w/ a h/o DVT and subsequent PE identified a few years ago and was placed on anticoagulation; now presents with thrombophlebitis and placement of IVC filter and bilateral total knee replacements. Orthopedic would like to restart patient's warfarin. Patient was apparently on warfarin PTA historically (was discontinued due to patient preference per med rec history): Warfarin 2.5 mg daily (was taking half of a 5 mg tablet)  12/03 INR 1.03 subtherapeutic  Goal of Therapy:  INR 2-3 Monitor platelets by anticoagulation protocol: Yes   Plan:  12/04 @ 0730 INR 1.09 per RN patient bleeding from IV sites and hgb went from 13.1 >>  12.2. I d/c'd the warfarin 5mg  one time dose and will start with the warfarin 2.5mg  @ 1800 tonight w/ daily INRs.  Thomasene Rippleavid Ahlana Slaydon, PharmD, BCPS Clinical Pharmacist 02/28/2018

## 2018-02-28 NOTE — Anesthesia Postprocedure Evaluation (Signed)
Anesthesia Post Note  Patient: Frank MayerGary A Rivera  Procedure(s) Performed: TOTAL KNEE BILATERAL (Bilateral )  Patient location during evaluation: Nursing Unit Anesthesia Type: Spinal Level of consciousness: oriented and awake and alert Pain management: pain level controlled Vital Signs Assessment: post-procedure vital signs reviewed and stable Respiratory status: spontaneous breathing and respiratory function stable Cardiovascular status: blood pressure returned to baseline and stable Postop Assessment: no headache, no backache, no apparent nausea or vomiting and patient able to bend at knees Anesthetic complications: no     Last Vitals:  Vitals:   02/28/18 0213 02/28/18 0325  BP: (!) 114/51 (!) 145/87  Pulse: 91 (!) 116  Resp: 15 14  Temp: 36.8 C 36.9 C  SpO2: 100% 100%    Last Pain:  Vitals:   02/28/18 0600  TempSrc:   PainSc: 4                  Zyair Russi

## 2018-02-28 NOTE — Clinical Social Work Note (Signed)
Clinical Social Work Assessment  Patient Details  Name: Frank Rivera MRN: [id number] Date of Birth: Jul 29, 1960  Date of referral:  02/28/18               Reason for consult:  Facility Placement                Permission sought to share information with:    Permission granted to share information::     Name::        Agency::     Relationship::     Contact Information:     Housing/Transportation Living arrangements for the past 2 months:  Single Family Home Source of Information:  Patient, Spouse Patient Interpreter Needed:  None Criminal Activity/Legal Involvement Pertinent to Current Situation/Hospitalization:  No - Comment as needed Significant Relationships:  Adult Children, Spouse Lives with:  Spouse, Adult Children Do you feel safe going back to the place where you live?  Yes Need for family participation in patient care:  Yes (Comment)  Care giving concerns:  Patient lives in [place] with his wife [name], daughter [name] and 55 y.o son [name].    Social Worker assessment / plan:  Holiday representative (CSW) received SNF consult. PT is recommending SNF. CSW met with patient and his wife [name] and daughter [name] were at bedside. Patient was alert and oriented X4 and was laying in the bed. CSW introduced self and explained role of CSW department. Per patient he has no health insurance and is apart of the [employer]. Per patient he has to private pay for medical expenses upfront and then submit a claim to the [employer] group. CSW explained that SNF will be private pay. Per patient he can't afford to pay for SNF and wants to go home. Per wife patient will have 24/7 supervision at home and their home is handicap accessible. RN case manager aware of above. CSW will continue to follow and assist as needed.   Employment status:  Disabled (Comment on whether or not currently receiving Disability) Insurance information:  Self Pay (Medicaid Pending) PT Recommendations:  [place] / Referral to community resources:  Other (Comment Required)(Patient will D/C home with home health. )  Patient/Family's Response to care:  Patient has no payer for SNF and prefers to D/C home.   Patient/Family's Understanding of and Emotional Response to Diagnosis, Current Treatment, and Prognosis:  Patient was very pleasant and thanked CSW for assistance.   Emotional Assessment Appearance:  Appears stated age Attitude/Demeanor/Rapport:    Affect (typically observed):  Accepting, Adaptable, Pleasant Orientation:  Oriented to Self, Oriented to Place, Oriented to  Time, Oriented to Situation Alcohol / Substance use:  Not Applicable Psych involvement (Current and /or in the community):  No (Comment)  Discharge Needs  Concerns to be addressed:  Discharge Planning Concerns Readmission within the last 30 days:  No Current discharge risk:  Dependent with Mobility Barriers to Discharge:  Continued Medical Work up   [employer], Veronia Beets, LCSW 02/28/2018, 2:44 PM

## 2018-02-28 NOTE — Progress Notes (Addendum)
Physical Therapy Treatment Patient Details Name: Frank Rivera MRN: [id number] DOB: December 09, 1960 Today's Date: 02/28/2018    History of Present Illness Pt is 57 yo male s/p bilateral TKA, WBAT. PMH of DVT and PE, IVC filter placed 02/21/18, former smoker, dypnea on exertion.     PT Comments    Patient alert and agreeable to PT at start of session, vitals WNLs. Pt demonstrated improved quad set bilaterally (moderate muscle contraction noted), still needed physical assistance for heel slides. Pt was able to mobilize to EOB with greater ease, but maxA+1, heavy use of bed rails, improved ability to scoot to EOB. Sit <> stand with RW, maxAx2 to stand, verbal cues for upright position, use of RW, weight bearing through heels and bilateral knee extension. Pt able to take small shuffling steps towards chair ~66ft with modAx2 and RW. Pt up in chair at end of session with family at bedside, in NAD, but complaining of significant pain. Nursing notified of progress, and potential need for lift equipment to return patient to bed. The patient would benefit from further skilled PT intervention to continue to progress towards goals.  Knee flexion in sitting bilaterally 60deg.      Follow Up Recommendations  SNF     Equipment Recommendations  Rolling walker with 5" wheels;3in1 (PT);Other (comment)    Recommendations for Other Services OT consult     Precautions / Restrictions Precautions Precautions: Knee Precaution Booklet Issued: Yes (comment) Restrictions Weight Bearing Restrictions: Yes RLE Weight Bearing: Weight bearing as tolerated LLE Weight Bearing: Weight bearing as tolerated    Mobility  Bed Mobility Overal bed mobility: Needs Assistance Bed Mobility: Supine to Sit     Supine to sit: Max assist;+2 for safety/equipment Sit to supine: Mod assist;+2 for physical assistance      Transfers Overall transfer level: Needs assistance Equipment used: Rolling walker (2 wheeled) Transfers:  Sit to/from Stand Sit to Stand: Max assist;+2 physical assistance         General transfer comment: Pt with improved ability to stand from elevated surface, verbal cues for upright posture, bilateral knee extension, weight bearing through heels. Heavy use of RW. Verbal cues for eccentric control to chair.  Ambulation/Gait Ambulation/Gait assistance: +2 physical assistance;Mod assist Gait Distance (Feet): 2 Feet Assistive device: Rolling walker (2 wheeled)       General Gait Details: shuffling gait, flexed trunk, cues for breathing. heavy use of UE.    Stairs             Wheelchair Mobility    Modified Rankin (Stroke Patients Only)       Balance Overall balance assessment: Needs assistance Sitting-balance support: Feet supported Sitting balance-Leahy Scale: Fair       Standing balance-Leahy Scale: Poor                              Cognition Arousal/Alertness: Awake/alert Behavior During Therapy: WFL for tasks assessed/performed Overall Cognitive Status: Within Functional Limits for tasks assessed                                        Exercises Total Joint Exercises Ankle Circles/Pumps: AROM;Both;20 reps Quad Sets: AROM;Both;Strengthening;20 reps Gluteal Sets: AROM;Strengthening;Both;15 reps Heel Slides: AAROM;Both;10 reps Hip ABduction/ADduction: AAROM;Both;10 reps Straight Leg Raises: AAROM;Both;10 reps Goniometric ROM: R + L knee flexion 60deg sitting in chair  General Comments        Pertinent Vitals/Pain Pain Assessment: 0-10 Pain Score: 6  Pain Location: both knees Pain Descriptors / Indicators: Sharp;Sore;Tightness Pain Intervention(s): Limited activity within patient's tolerance;Patient requesting pain meds-RN notified;Monitored during session;Premedicated before session;Repositioned;Ice applied    Home Living Family/patient expects to be discharged to:: Private residence Living Arrangements:  Spouse/significant other Available Help at Discharge: Family;Available 24 hours/day Type of Home: House Home Access: Stairs to enter Entrance Stairs-Rails: Right Home Layout: Two level;Able to live on main level with bedroom/bathroom Home Equipment: Grab bars - toilet;Grab bars - tub/shower;Shower seat      Prior Function Level of Independence: Independent          PT Goals (current goals can now be found in the care plan section) Acute Rehab PT Goals Patient Stated Goal: Pt wants to go home PT Goal Formulation: With patient/family Time For Goal Achievement: 03/14/18 Potential to Achieve Goals: Fair Progress towards PT goals: Progressing toward goals(slowly)    Frequency    BID      PT Plan Current plan remains appropriate    Co-evaluation              AM-PAC PT "6 Clicks" Mobility   Outcome Measure  Help needed turning from your back to your side while in a flat bed without using bedrails?: A Lot Help needed moving from lying on your back to sitting on the side of a flat bed without using bedrails?: A Lot Help needed moving to and from a bed to a chair (including a wheelchair)?: A Lot Help needed standing up from a chair using your arms (e.g., wheelchair or bedside chair)?: A Lot Help needed to walk in hospital room?: Total Help needed climbing 3-5 steps with a railing? : Total 6 Click Score: 10    End of Session Equipment Utilized During Treatment: Gait belt Activity Tolerance: Patient limited by pain;Patient limited by fatigue;Other (comment) Patient left: in bed;with call bell/phone within reach;with bed alarm set;with SCD's reapplied Nurse Communication: Mobility status;Need for lift equipment PT Visit Diagnosis: Unsteadiness on feet (R26.81);Difficulty in walking, not elsewhere classified (R26.2);Other abnormalities of gait and mobility (R26.89);Muscle weakness (generalized) (M62.81);Pain Pain - Right/Left: Left(And R knee) Pain - part of body: Knee      Time: [id number] PT Time Calculation (min) (ACUTE ONLY): 32 min  Charges:  $Therapeutic Exercise: 8-22 mins $Therapeutic Activity: 23-37 mins                     Olga Coasteriana Aliveah Gallant PT, DPT 3:49 PM,02/28/18 [phone]

## 2018-02-28 NOTE — [place] FL2 (Signed)
[place] MEDICAID FL2 LEVEL OF CARE SCREENING TOOL     IDENTIFICATION  Patient Name: Frank Rivera Birthdate: November 11, 1960 Sex: male Admission Date (Current Location): 02/27/2018  [place] and [place] Number:  Chiropodist and Address:  [facility], [address] [place], [place] [address]      Provider Number: [id number]  Attending Physician Name and Address:  Kennedy Bucker, MD  Relative Name and Phone Number:       Current Level of Care: Hospital Recommended Level of Care: Skilled Nursing Facility Prior Approval Number:    Date Approved/Denied:   PASRR Number:    Discharge Plan: SNF    Current Diagnoses: Patient Active Problem List   Diagnosis Date Noted  . S/P TKR (total knee replacement) using cement, bilateral 02/27/2018  . Pain in limb 12/16/2016  . Superficial thrombophlebitis of right leg 09/20/2016  . 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 Pulmonary embolism, bilateral (HCC) 01/04/2016  . Primary hypercoagulable state (HCC) 01/04/2016  . Acute pulmonary embolism (HCC) 11/17/2015    Orientation RESPIRATION BLADDER Height & Weight     Self, Time, Place, Situation  Normal Continent Weight: 274 lb 0.5 oz (124.3 kg) Height:  5\' 8"  (172.7 cm)  BEHAVIORAL SYMPTOMS/MOOD NEUROLOGICAL BOWEL NUTRITION STATUS      Continent Diet(Diet: Regular )  AMBULATORY STATUS COMMUNICATION OF NEEDS Skin   Extensive Assist Verbally Surgical wounds(Incisions: Bilateral knees. )                       Personal Care Assistance Level of Assistance  Bathing, Feeding, Dressing Bathing Assistance: Limited assistance Feeding assistance: Independent Dressing Assistance: Limited assistance     Functional Limitations Info  Sight, Hearing, Speech Sight Info: Adequate Hearing Info: Adequate Speech Info: Adequate    SPECIAL CARE FACTORS FREQUENCY  PT (By licensed PT), OT (By licensed OT)     PT Frequency: (5) OT Frequency:  (5)            Contractures      Additional Factors Info  Code Status, Allergies Code Status Info: (Full Code. ) Allergies Info: (Sulfa Antibiotics)           Current Medications (02/28/2018):  This is the current hospital active medication list Current Facility-Administered Medications  Medication Dose Route Frequency Provider Last Rate Last Dose  . 0.9 %  sodium chloride infusion   Intravenous Continuous Kennedy Bucker, MD   Stopped at 02/27/18 2225  . acetaminophen (TYLENOL) tablet 1,000 mg  1,000 mg Oral Q6H Kennedy Bucker, MD   1,000 mg at 02/28/18 0506  . acetaminophen (TYLENOL) tablet 325-650 mg  325-650 mg Oral Q6H PRN Kennedy Bucker, MD      . alum & mag hydroxide-simeth (MAALOX/MYLANTA) 200-200-20 MG/5ML suspension 30 mL  30 mL Oral Q4H PRN Kennedy Bucker, MD      . bisacodyl (DULCOLAX) EC tablet 5 mg  5 mg Oral Daily PRN Kennedy Bucker, MD      . ceFAZolin (ANCEF) IVPB 2g/100 mL premix  2 g Intravenous Q6H Kennedy Bucker, MD 200 mL/hr at 02/28/18 0333 2 g at 02/28/18 0333  . diphenhydrAMINE (BENADRYL) 12.5 MG/5ML elixir 12.5-25 mg  12.5-25 mg Oral Q4H PRN Kennedy Bucker, MD      . docusate sodium (COLACE) capsule 100 mg  100 mg Oral BID Kennedy Bucker, MD      . enoxaparin (LOVENOX) injection 40 mg  40 mg Subcutaneous Q12H Kennedy Bucker, MD   40  mg at 02/28/18 0816  . gabapentin (NEURONTIN) capsule 300 mg  300 mg Oral TID Kennedy BuckerMenz, Michael, MD      . HYDROmorphone (DILAUDID) injection 0.5-1 mg  0.5-1 mg Intravenous Q4H PRN Kennedy BuckerMenz, Michael, MD   1 mg at 02/28/18 0816  . magnesium citrate solution 1 Bottle  1 Bottle Oral Once PRN Kennedy BuckerMenz, Michael, MD      . magnesium hydroxide (MILK OF MAGNESIA) suspension 30 mL  30 mL Oral Daily PRN Kennedy BuckerMenz, Michael, MD   30 mL at 02/28/18 0816  . menthol-cetylpyridinium (CEPACOL) lozenge 3 mg  1 lozenge Oral PRN Kennedy BuckerMenz, Michael, MD       Or  . phenol (CHLORASEPTIC) mouth spray 1 spray  1 spray Mouth/Throat PRN Kennedy BuckerMenz, Michael, MD      . methocarbamol (ROBAXIN)  tablet 500 mg  500 mg Oral Q6H PRN Kennedy BuckerMenz, Michael, MD       Or  . methocarbamol (ROBAXIN) 500 mg in dextrose 5 % 50 mL IVPB  500 mg Intravenous Q6H PRN Kennedy BuckerMenz, Michael, MD      . metoCLOPramide (REGLAN) tablet 5-10 mg  5-10 mg Oral Q8H PRN Kennedy BuckerMenz, Michael, MD       Or  . metoCLOPramide (REGLAN) injection 5-10 mg  5-10 mg Intravenous Q8H PRN Kennedy BuckerMenz, Michael, MD      . ondansetron [facility](ZOFRAN) tablet 4 mg  4 mg Oral Q6H PRN Kennedy BuckerMenz, Michael, MD       Or  . ondansetron [facility](ZOFRAN) injection 4 mg  4 mg Intravenous Q6H PRN Kennedy BuckerMenz, Michael, MD   4 mg at 02/27/18 2201  . oxyCODONE (Oxy IR/ROXICODONE) immediate release tablet 10-15 mg  10-15 mg Oral Q4H PRN Kennedy BuckerMenz, Michael, MD   10 mg at 02/28/18 0505  . oxyCODONE (Oxy IR/ROXICODONE) immediate release tablet 5-10 mg  5-10 mg Oral Q4H PRN Kennedy BuckerMenz, Michael, MD   10 mg at 02/28/18 0004  . simvastatin (ZOCOR) tablet 60 mg  60 mg Oral QHS Kennedy BuckerMenz, Michael, MD      . traMADol Janean Sark(ULTRAM) tablet 50 mg  50 mg Oral Q6H Kennedy BuckerMenz, Michael, MD   50 mg at 02/27/18 2318  . warfarin (COUMADIN) tablet 2.5 mg  2.5 mg Oral q1800 Kennedy BuckerMenz, Michael, MD      . zolpidem [facility](AMBIEN) tablet 5 mg  5 mg Oral QHS PRN,MR X 1 Kennedy BuckerMenz, Michael, MD         Discharge Medications: Please see discharge summary for a list of discharge medications.  Relevant Imaging Results:  Relevant Lab Results:   Additional Information (SSN: [id number])  Makeisha Jentsch, Darleen CrockerBailey M, LCSW

## 2018-02-28 NOTE — Care Management Note (Addendum)
Case Management Note  Patient Details  Name: NORRIS BRUMBACH MRN: [id number] Date of Birth: [birth year]  Subjective/Objective:                  RNCM met with patient to discuss discharge planning. He plans to return to home at discharge.  He will need a rolling walker under private pay. [facility] has agreed to follow patient for PT.  His insurance is a private group insurance that he pays costs of health care delivery and get reimbursed by group insurance plan.    Action/Plan: Home health list provided to patient.  Referral to [facility].  RNCM attempted to reach patient's wife to see if she could stop by [facility] to pick up rolling walker since we cannot provide a receipt of purchase here at hospital. I a checking with [facility] rep to see if he can collect money from patient and bring rolling walker and receipt to patient tomorrow. Update: RNCM received call from patient's wife. She will go to [facility] to pick up a walker.  They has a standard walker available for use at home however front-wheeled walker would be better.   Expected Discharge Date:                  Expected Discharge Plan:     In-House Referral:     Discharge planning Services  CM Consult  Post Acute Care Choice:  Durable Medical Equipment, Home Health Choice offered to:  Patient  DME Arranged:  Walker rolling DME Agency:  [place]:  PT, OT [place] Agency:  [facility] ([facility]  Status of Service:  In process, will continue to follow  If discussed at Long Length of Stay Meetings, dates discussed:    Additional Comments:  Marshell Garfinkel, RN 02/28/2018, 1:22 PM

## 2018-03-01 LAB — BASIC METABOLIC PANEL
Anion gap: 6 (ref 5–15)
BUN: 19 mg/dL (ref 6–20)
CO2: 31 mmol/L (ref 22–32)
Calcium: 8.6 mg/dL — ABNORMAL LOW (ref 8.9–10.3)
Chloride: 98 mmol/L (ref 98–111)
Creatinine, Ser: 1.08 mg/dL (ref 0.61–1.24)
GFR calc Af Amer: >60 mL/min (ref >60)
Glucose, Bld: 136 mg/dL — ABNORMAL HIGH (ref 70–99)
Potassium: 4 mmol/L (ref 3.5–5.1)
Sodium: 135 mmol/L (ref 135–145)

## 2018-03-01 LAB — CBC
HCT: 33.8 % — ABNORMAL LOW (ref 39.0–52.0)
Hemoglobin: 11.1 g/dL — ABNORMAL LOW (ref 13.0–17.0)
MCH: 32.7 pg (ref 26.0–34.0)
MCHC: 32.8 g/dL (ref 30.0–36.0)
MCV: 99.7 fL (ref 80.0–100.0)
NRBC: 0 % (ref 0.0–0.2)
Platelets: 168 10*3/uL (ref 150–400)
RBC: 3.39 MIL/uL — AB (ref 4.22–5.81)
RDW: 12.6 % (ref 11.5–15.5)
WBC: 9.6 10*3/uL (ref 4.0–10.5)

## 2018-03-01 LAB — PROTIME-INR
INR: 1.05
Prothrombin Time: 13.6 seconds (ref 11.4–15.2)

## 2018-03-01 LAB — HEMOGLOBIN: Hemoglobin: 10.5 g/dL — ABNORMAL LOW (ref 13.0–17.0)

## 2018-03-01 MED ORDER — TRAMADOL HCL 50 MG PO TABS
50.0000 mg | ORAL_TABLET | ORAL | Status: DC | PRN
  Administered 2018-03-01: 50 mg via ORAL
  Filled 2018-03-01: qty 2
  Filled 2018-03-01: qty 1

## 2018-03-01 MED ORDER — SODIUM CHLORIDE 0.9 % IV BOLUS: 500.0000 mL | Freq: Once | INTRAVENOUS | Status: DC

## 2018-03-01 MED ORDER — SODIUM CHLORIDE 0.9 % IV SOLN
INTRAVENOUS | Status: DC
  Administered 2018-03-03: 19:00:00 via INTRAVENOUS

## 2018-03-01 MED ORDER — TAMSULOSIN HCL 0.4 MG PO CAPS
0.8000 mg | ORAL_CAPSULE | Freq: Every day | ORAL | Status: DC
  Administered 2018-03-01: 0.8 mg via ORAL
  Filled 2018-03-01: qty 2

## 2018-03-01 MED ORDER — SODIUM CHLORIDE 0.9 % NICU IV INFUSION SIMPLE
500.0000 mL | INJECTION | Freq: Once | INTRAVENOUS | Status: AC
  Administered 2018-03-01: 500 mL via INTRAVENOUS
  Filled 2018-03-01: qty 500

## 2018-03-01 NOTE — Progress Notes (Signed)
[facility] and patient went non-responsive, pulse palpable. Laid patient flat, BP 97/59. After sternial rub, patient was able to answer question, but is groggy. Notified Dr. Rosita KeaMenz. New orders for 500cc NS bolus, and restart IV fluids at 100cc/hr of NS. Stat HGB. BP improved to 114/70. Notified PT.

## 2018-03-01 NOTE — Progress Notes (Signed)
OT Cancellation Note  Patient Details Name: Frank Rivera MRN: [id number] DOB: 03/12/1961   Cancelled Treatment:    Reason Eval/Treat Not Completed: Fatigue/lethargy limiting ability to participate. Consult received, chart reviewed. Upon attempt, pt reporting having just taken a muscle relaxer and "having a hard time finishing my sentences." Will hold OT evaluation at this time and re-attempt at later date/time as pt is more alert and able to participate.   Richrd PrimeJamie Stiller, MPH, MS, OTR/L ascom [phone] 03/01/18, 11:10 AM

## 2018-03-01 NOTE — Progress Notes (Signed)
OT Cancellation Note  Patient Details Name: Frank MayerGary A Sabbagh MRN: [id number] DOB: 02/04/1961   Cancelled Treatment:    Reason Eval/Treat Not Completed: Medical issues which prohibited therapy. Per RN/PT, pt went unresponsive during a transfer with nursing earlier. RN requesting hold therapy early afternoon. Will re-attempt later as medically appropriate.   Richrd PrimeJamie Stiller, MPH, MS, OTR/L ascom [phone] 03/01/18, 2:52 PM

## 2018-03-01 NOTE — Progress Notes (Signed)
   Subjective: 2 Days Post-Op Procedure(s) (LRB): TOTAL KNEE BILATERAL (Bilateral) Patient reports pain as 9 on 0-10 scale.   Patient is well, and has had no acute complaints or problems Denies any CP, SOB, ABD pain. We will continue with physical therapy today.  Plan is to go Skilled nursing facility after hospital stay.  Objective: Vital signs in last 24 hours: Temp:  [97.6 F (36.4 C)-98.3 F (36.8 C)] 98.3 F (36.8 C) (12/04 2310) Pulse Rate:  [100-118] 112 (12/04 2312) Resp:  [18-20] 20 (12/04 2310) BP: (139-148)/(85-106) 140/89 (12/04 2312) SpO2:  [95 %-97 %] 97 % (12/04 2310)  Intake/Output from previous day: 12/04 0701 - 12/05 0700 In: 240 [P.O.:240] Out: 755 [Urine:755] Intake/Output this shift: No intake/output data recorded.  Recent Labs    02/27/18 2201 02/28/18 0411 03/01/18 0416  HGB 13.1 12.2* 11.1*   Recent Labs    02/28/18 0411 03/01/18 0416  WBC 12.5* 9.6  RBC 3.71* 3.39*  HCT 37.1* 33.8*  PLT 217 168   Recent Labs    02/28/18 0411 03/01/18 0416  NA 139 135  K 4.8 4.0  CL 103 98  CO2 29 31  BUN 12 19  CREATININE 0.95 1.08  GLUCOSE 171* 136*  CALCIUM 8.5* 8.6*   Recent Labs    02/28/18 0411 03/01/18 0416  INR 1.09 1.05    EXAM General - Patient is Alert, Appropriate and Oriented Bilateral Extremities - Neurovascular intact Sensation intact distally Intact pulses distally Dorsiflexion/Plantar flexion intact No cellulitis present Compartment soft Dressing - dressing C/D/I and no drainage.  No active drainage. Motor Function - intact, moving left and right feet and toes well on exam.   Past Medical History:  Diagnosis Date  . Dyspnea   . Dyspnea on exertion   . Elevated lipids   . Hx of blood clots    legs  . Pulmonary emboli (HCC)   . Pulmonary embolism (HCC) 2011  . Pulmonary embolism (HCC)    2017    Assessment/Plan:   2 Days Post-Op Procedure(s) (LRB): TOTAL KNEE BILATERAL (Bilateral) Active Problems:   S/P  TKR (total knee replacement) using cement, bilateral  Estimated body mass index is 41.67 kg/m as calculated from the following:   Height as of this encounter: 5\' 8"  (1.727 m).   Weight as of this encounter: 124.3 kg. Advance diet Up with therapy  Needs BM Continue with oxycodone regimen, increase tramadol to every 4 hours.  Alternate tramadol with oxycodone. Labs are stable Continue bridging with Lovenox.  Recheck INR in the morning Vital signs are stable, patient slightly tachycardic.  Will give bolus of normal saline. Care management to assist with discharge to home with home health PT.  Patient prefers to go home as he has a lot of help at home.  DVT Prophylaxis - Lovenox, Coumadin, TED hose and SCDs Weight-Bearing as tolerated to left and right leg   T. Cranston Neighborhris Gaines, PA-C [facility] 03/01/2018, 9:14 AM

## 2018-03-01 NOTE — Progress Notes (Signed)
Pt did not void. Bladder scan done, 413 mls. Dr. Rosita KeaMenz made aware. Order for in and out obtained.

## 2018-03-01 NOTE — Progress Notes (Signed)
Physical Therapy Treatment Patient Details Name: Frank Rivera MRN: [id number] DOB: 1960-04-04 Today's Date: 03/01/2018    History of Present Illness Pt is 57 yo male s/p bilateral TKA, WBAT. PMH of DVT and PE, IVC filter placed 02/21/18, former smoker, dypnea on exertion.     PT Comments    Patient alert and agreeable to PT. HR monitored from beginning of exercises to sitting EOB, ranged from 103-115 BPM, spO2 stable. Pt with complaints of bilateral knee pain L > R, at least 7/10 for both. Pt with some improvement in active participation of exercises, most challenged by SAQ Barbaraann Boys for all exercises except ankle pumps/quad sets). Pt mobilized to EOB with minAx1 and heavy use of bed rails, HOB elevated. Sit<>stand transfer with heavy use of UE, modAx2, and ambulated with RW and min-modAx2 and heavy assistance of RW. Pt still with difficulty with bilateral knee extension, verbal cues to step with LLE as appropriate. Pt up in chair at end of session, all needs in reach. Nursing notified of patient's increased pain with exercises/mobility. The patient demonstrated some improvement in mobility and participation in exercises, still significantly limited in ambulation and safety, current recommendation still appropriate due to this and inaccessible home environment (3 STE, no rails).       Follow Up Recommendations  SNF     Equipment Recommendations  Rolling walker with 5" wheels;3in1 (PT);Other (comment)    Recommendations for Other Services OT consult     Precautions / Restrictions Precautions Precautions: Fall Restrictions Weight Bearing Restrictions: Yes RLE Weight Bearing: Weight bearing as tolerated LLE Weight Bearing: Weight bearing as tolerated    Mobility  Bed Mobility Overal bed mobility: Needs Assistance Bed Mobility: Supine to Sit       Sit to supine: Min assist   General bed mobility comments: heavy use of bed rails  Transfers Overall transfer level: Needs  assistance Equipment used: Rolling walker (2 wheeled) Transfers: Sit to/from Stand              Ambulation/Gait Ambulation/Gait assistance: Mod assist;+2 physical assistance Gait Distance (Feet): 3 Feet Assistive device: Rolling walker (2 wheeled) Gait Pattern/deviations: Shuffle     General Gait Details: heavy use of RW, needed assistance with RW guidance, cues for knee extension bilaterally   Stairs             Wheelchair Mobility    Modified Rankin (Stroke Patients Only)       Balance Overall balance assessment: Needs assistance Sitting-balance support: Feet supported Sitting balance-Leahy Scale: Fair       Standing balance-Leahy Scale: Poor                              Cognition Arousal/Alertness: Awake/alert Behavior During Therapy: WFL for tasks assessed/performed Overall Cognitive Status: Within Functional Limits for tasks assessed                                        Exercises Total Joint Exercises Ankle Circles/Pumps: AROM;Both;20 reps Quad Sets: AROM;Both;Strengthening;20 reps Short Arc QuadBarbaraann Boys;Both;10 reps Heel Slides: AAROM;Both;10 reps Hip ABduction/ADduction: AAROM;Both;15 reps    General Comments        Pertinent Vitals/Pain Pain Assessment: 0-10 Pain Score: 7  Pain Location: both knees, L > R Pain Descriptors / Indicators: Sharp;Sore;Tightness;Aching Pain Intervention(s): Limited activity within patient's tolerance;Monitored during session;Patient requesting pain  meds-RN notified;Ice applied;Premedicated before session;Repositioned    Home Living                      Prior Function            PT Goals (current goals can now be found in the care plan section)      Frequency    BID      PT Plan Current plan remains appropriate    Co-evaluation              AM-PAC PT "6 Clicks" Mobility   Outcome Measure  Help needed turning from your back to your side while in  a flat bed without using bedrails?: A Lot Help needed moving from lying on your back to sitting on the side of a flat bed without using bedrails?: A Lot Help needed moving to and from a bed to a chair (including a wheelchair)?: A Lot Help needed standing up from a chair using your arms (e.g., wheelchair or bedside chair)?: A Lot Help needed to walk in hospital room?: A Lot Help needed climbing 3-5 steps with a railing? : Total 6 Click Score: 11    End of Session Equipment Utilized During Treatment: Gait belt Activity Tolerance: Patient tolerated treatment well;Patient limited by fatigue Patient left: with chair alarm set;in chair;with call bell/phone within reach;with SCD's reapplied(heels elevated, polar cares in place) Nurse Communication: Mobility status;Patient requests pain meds PT Visit Diagnosis: Unsteadiness on feet (R26.81);Difficulty in walking, not elsewhere classified (R26.2);Other abnormalities of gait and mobility (R26.89);Muscle weakness (generalized) (M62.81);Pain Pain - Right/Left: Left(and R knee) Pain - part of body: Knee     Time: 0911-0955 PT Time Calculation (min) (ACUTE ONLY): 44 min  Charges:  $Therapeutic Exercise: 8-22 mins $Therapeutic Activity: 23-37 mins                     Olga Coasteriana Yassine Brunsman PT, DPT 10:17 AM,03/01/18 [phone]

## 2018-03-01 NOTE — Progress Notes (Signed)
Physical Therapy Treatment Patient Details Name: Frank Rivera MRN: [id number] DOB: Dec 25, 1960 Today's Date: 03/01/2018    History of Present Illness Pt is 57 yo male s/p bilateral TKA, WBAT. PMH of DVT and PE, IVC filter placed 02/21/18, former smoker, dypnea on exertion.     PT Comments    Pt agreeable to PT at start of session, motivated to try. Bilateral knee pain 5/10. BP assessed in sitting with and without feet elevated, pt is orthostatic with some complaints of lightheadedness, nursing aware and BP medications/fluids administered prior to session. Pt still needed physical assistance for all therapeutic exercises (except ankle pumps/quad sets). Poor quad activation with bilateral SLR noted. Pt needed maxAx2 to stand, and heavy guidance of AD, RW management, and verbal cues for gait mechanics to stand pivot to bed. Cued to weight bear less through UE to allow RW to move. ModAx2 to return to supine. Pt had more difficulty with mobility/ambulation compared to previous session, needed more physical assistance to perform all activities. The patient would benefit from further skilled PT to progress pt towards goals/PLOF as able.    Follow Up Recommendations  SNF     Equipment Recommendations  Rolling walker with 5" wheels;3in1 (PT);Other (comment)    Recommendations for Other Services OT consult     Precautions / Restrictions Precautions Precautions: Fall Precaution Booklet Issued: Yes (comment) Restrictions Weight Bearing Restrictions: Yes RLE Weight Bearing: Weight bearing as tolerated LLE Weight Bearing: Weight bearing as tolerated    Mobility  Bed Mobility Overal bed mobility: Needs Assistance Bed Mobility: Sit to Supine     Supine to sit: Mod assist;+2 for physical assistance        Transfers Overall transfer level: Needs assistance Equipment used: Rolling walker (2 wheeled) Transfers: Stand Pivot Transfers   Stand pivot transfers: Max assist;+2 physical  assistance       General transfer comment: Knees flexed more by PT prior to mobilization. Heavy use of UE on chair arms. More difficulty with knee extension bilaterally, heavy use of RW in standing.  Ambulation/Gait Ambulation/Gait assistance: Max assist;+2 physical assistance Gait Distance (Feet): 1 Feet Assistive device: Rolling walker (2 wheeled) Gait Pattern/deviations: Shuffle     General Gait Details: Needed assistance for gait cues, AD management, maxAx2 this session to stand pivot to bed. Unable to extend bilateral knees   Stairs             Wheelchair Mobility    Modified Rankin (Stroke Patients Only)       Balance Overall balance assessment: Needs assistance Sitting-balance support: Feet supported Sitting balance-Leahy Scale: Fair       Standing balance-Leahy Scale: Zero                              Cognition Arousal/Alertness: Awake/alert Behavior During Therapy: WFL for tasks assessed/performed Overall Cognitive Status: Within Functional Limits for tasks assessed                                        Exercises Total Joint Exercises Ankle Circles/Pumps: AROM;Both;20 reps Quad Sets: AROM;Both;Strengthening;20 reps Gluteal Sets: AROM;Strengthening;Both;15 reps Hip ABduction/ADduction: AAROM;Both;15 reps Straight Leg Raises: AAROM;Both;10 reps Goniometric ROM: R extension: -8deg, R flexion: 68deg in chair, L extension: -10, L flexion: 70deg in chair    General Comments        Pertinent Vitals/Pain  Pain Assessment: 0-10 Pain Score: 5  Pain Location: both knees, L > R Pain Descriptors / Indicators: Sharp;Sore;Tightness;Aching Pain Intervention(s): Limited activity within patient's tolerance;Monitored during session;Patient requesting pain meds-RN notified;Ice applied;Premedicated before session;Repositioned    Home Living                      Prior Function            PT Goals (current goals can now  be found in the care plan section) Progress towards PT goals: Progressing toward goals    Frequency    BID      PT Plan Current plan remains appropriate    Co-evaluation              AM-PAC PT "6 Clicks" Mobility   Outcome Measure  Help needed turning from your back to your side while in a flat bed without using bedrails?: A Lot Help needed moving from lying on your back to sitting on the side of a flat bed without using bedrails?: A Lot Help needed moving to and from a bed to a chair (including a wheelchair)?: A Lot Help needed standing up from a chair using your arms (e.g., wheelchair or bedside chair)?: Total Help needed to walk in hospital room?: Total Help needed climbing 3-5 steps with a railing? : Total 6 Click Score: 9    End of Session Equipment Utilized During Treatment: Gait belt Activity Tolerance: Patient limited by pain;Patient limited by fatigue Patient left: in bed;with family/visitor present;with call bell/phone within reach;with SCD's reapplied;with bed alarm set Nurse Communication: Mobility status PT Visit Diagnosis: Unsteadiness on feet (R26.81);Difficulty in walking, not elsewhere classified (R26.2);Other abnormalities of gait and mobility (R26.89);Muscle weakness (generalized) (M62.81);Pain Pain - Right/Left: Left(and R) Pain - part of body: Knee     Time: [id number] PT Time Calculation (min) (ACUTE ONLY): 47 min  Charges:  $Therapeutic Exercise: 8-22 mins $Therapeutic Activity: 23-37 mins                     Olga Coasteriana Tiari Andringa PT, DPT 3:55 PM,03/01/18 [phone]

## 2018-03-01 NOTE — Progress Notes (Signed)
ANTICOAGULATION CONSULT NOTE - Initial Consult  Pharmacy Consult for warfarin therapy post TKR Indication: DVT w/o PE   Allergies  Allergen Reactions  . Sulfa Antibiotics Other (See Comments)    As a child-told he had reaction to sulfa drugs.    Vital Signs: Temp: 98.1 F (36.7 C) (12/05 0815) Temp Source: Oral (12/05 0815) BP: 149/84 (12/05 0815) Pulse Rate: 111 (12/05 0815)  Labs: Recent Labs    02/27/18 2201 02/28/18 0411 03/01/18 0416  HGB 13.1 12.2* 11.1*  HCT 39.6 37.1* 33.8*  PLT 216 217 168  LABPROT 13.4 14.0 13.6  INR 1.03 1.09 1.05  CREATININE 0.89 0.95 1.08    Estimated Creatinine Clearance: 99.3 mL/min (by C-G formula based on SCr of 1.08 mg/dL).   Medical History: Past Medical History:  Diagnosis Date  . Dyspnea   . Dyspnea on exertion   . Elevated lipids   . Hx of blood clots    legs  . Pulmonary emboli (HCC)   . Pulmonary embolism (HCC) 2011  . Pulmonary embolism (HCC)    2017    Medications:  Scheduled:  . docusate sodium  100 mg Oral BID  . enoxaparin (LOVENOX) injection  40 mg Subcutaneous Q12H  . gabapentin  300 mg Oral TID  . simvastatin  60 mg Oral QHS  . warfarin  2.5 mg Oral q1800    Assessment: Patient presents for pain in the lower extremities w/ a h/o DVT and subsequent PE identified a few years ago and was placed on anticoagulation; now presents with thrombophlebitis and placement of IVC filter and bilateral total knee replacements. Orthopedic would like to restart patient's warfarin. Patient was apparently on warfarin PTA historically (was discontinued due to patient preference per med rec history): Warfarin 2.5 mg daily (was taking half of a 5 mg tablet)  Date INR Warfarin Dose  12/3 1.03   12/4 1.09 2.5 mg  12/5 1.05 2.5 mg     Goal of Therapy:  INR 2-3 Monitor platelets by anticoagulation protocol: Yes   Plan:  Will continue the warfarin 2.5mg  @ 1800 tonight w/ daily INRs. Pt also on enoxaparin. Hgb/Plt slightly  trending down. No note of bleeding on chart.   Paschal DoppKishan Elio Haden, PharmD, BCPS Clinical Pharmacist 03/01/2018

## 2018-03-01 NOTE — Progress Notes (Signed)
Patient still unable to void. In and out cath x 2 already done on previous shifts. Bladder scan 248cc. IV fluids running. Patient encouraged to drink fluids. MD notified. New orders to place foley.

## 2018-03-02 ENCOUNTER — Inpatient Hospital Stay: Payer: Self-pay

## 2018-03-02 LAB — CBC
HCT: 29.3 % — ABNORMAL LOW (ref 39.0–52.0)
Hemoglobin: 9.7 g/dL — ABNORMAL LOW (ref 13.0–17.0)
MCH: 32.8 pg (ref 26.0–34.0)
MCHC: 33.1 g/dL (ref 30.0–36.0)
MCV: 99 fL (ref 80.0–100.0)
Platelets: 202 10*3/uL (ref 150–400)
RBC: 2.96 MIL/uL — ABNORMAL LOW (ref 4.22–5.81)
RDW: 13.1 % (ref 11.5–15.5)
WBC: 11.9 10*3/uL — ABNORMAL HIGH (ref 4.0–10.5)
nRBC: 0.2 % (ref 0.0–0.2)

## 2018-03-02 LAB — BASIC METABOLIC PANEL
ANION GAP: 10 (ref 5–15)
BUN: 30 mg/dL — ABNORMAL HIGH (ref 6–20)
CO2: 25 mmol/L (ref 22–32)
Calcium: 8.3 mg/dL — ABNORMAL LOW (ref 8.9–10.3)
Chloride: 97 mmol/L — ABNORMAL LOW (ref 98–111)
Creatinine, Ser: 1.16 mg/dL (ref 0.61–1.24)
GFR calc non Af Amer: >60 mL/min (ref >60)
Glucose, Bld: 138 mg/dL — ABNORMAL HIGH (ref 70–99)
Potassium: 3.8 mmol/L (ref 3.5–5.1)
Sodium: 132 mmol/L — ABNORMAL LOW (ref 135–145)

## 2018-03-02 LAB — PROTIME-INR
INR: 1.14
Prothrombin Time: 14.5 seconds (ref 11.4–15.2)

## 2018-03-02 MED ORDER — PANTOPRAZOLE SODIUM 40 MG IV SOLR
40.0000 mg | Freq: Two times a day (BID) | INTRAVENOUS | Status: DC
  Administered 2018-03-02 – 2018-03-06 (×9): 40 mg via INTRAVENOUS
  Filled 2018-03-02 (×9): qty 40

## 2018-03-02 MED ORDER — WARFARIN SODIUM 3 MG PO TABS
3.0000 mg | ORAL_TABLET | Freq: Once | ORAL | Status: DC
  Filled 2018-03-02: qty 1

## 2018-03-02 MED ORDER — WARFARIN SODIUM 3 MG PO TABS
3.0000 mg | ORAL_TABLET | Freq: Once | ORAL | Status: AC
  Administered 2018-03-02: 3 mg via ORAL
  Filled 2018-03-02: qty 1

## 2018-03-02 MED ORDER — DIPHENHYDRAMINE HCL 50 MG/ML IJ SOLN
12.5000 mg | Freq: Once | INTRAMUSCULAR | Status: AC
  Administered 2018-03-02: 12.5 mg via INTRAVENOUS
  Filled 2018-03-02 (×2): qty 1

## 2018-03-02 MED ORDER — MORPHINE SULFATE (PF) 2 MG/ML IV SOLN
1.0000 mg | INTRAVENOUS | Status: DC | PRN
  Administered 2018-03-02 – 2018-03-03 (×2): 1 mg via INTRAVENOUS
  Filled 2018-03-02 (×2): qty 1

## 2018-03-02 MED ORDER — WARFARIN - PHARMACIST DOSING INPATIENT: Freq: Every day | Status: DC

## 2018-03-02 MED ORDER — MORPHINE SULFATE (PF) 2 MG/ML IV SOLN
2.0000 mg | INTRAVENOUS | Status: DC | PRN
  Administered 2018-03-02 – 2018-03-03 (×3): 2 mg via INTRAVENOUS
  Filled 2018-03-02 (×3): qty 1

## 2018-03-02 MED ORDER — HYDROMORPHONE HCL 1 MG/ML IJ SOLN: 0.5000 mg | INTRAMUSCULAR | Status: DC | PRN

## 2018-03-02 NOTE — Progress Notes (Addendum)
Rehab Admissions Coordinator Note:  Per PT request, patient was screened by Nanine MeansKelly Athony Coppa for appropriateness for an Inpatient Acute Rehab Consult.  AC will follow for progress with therapy today, monitoring for signs of improved activity tolerance and medical readiness (increased ambulation distance, ability to remain OOB for > 1 hour at a time, stable vital signs during therapy, etc). If progress made today, AC will contact patient to discuss interest.   I have placed an IP Rehab Consult Order as a way to follow progress.   [name] 03/02/2018, 8:31 AM  I can be reached at [phone].

## 2018-03-02 NOTE — Care Management (Addendum)
RNCM spoke with PT and patient is not currently safe to return to home. He did ambulate better and farther today. Patient did not decline CIR however he did decline SNF.  I spoke with his wife and she said that "he will not go any place but home".  They have arranged a caregiver in the home. Son will miss work next week to stay home with him.  Her daughter will also assist. She anticipates discharge to home on Saturday. She is picking up walker and possibly SCDs at [facility] now. She has a bedside commode but not sure if it has the bucket- she is going to check and obtain on her own. Rosey Batheresa with [facility] updated. Update: patient now have ileus.

## 2018-03-02 NOTE — Consult Note (Signed)
[facility] [place] at [facility]   PATIENT NAME: Frank Rivera    MR#:  [id number]  DATE OF BIRTH:  Dec 16, 1960  DATE OF ADMISSION:  02/27/2018  PRIMARY CARE PHYSICIAN: Dione Housekeeper, MD   REQUESTING/REFERRING PHYSICIAN: Rosita Kea  CHIEF COMPLAINT:  tachycardia  HISTORY OF PRESENT ILLNESS: Frank Rivera  is a 57 y.o. male with a known history of DVT, PE, status post IVC filter-admitted to orthopedic service for elective bilateral total knee replacement.  Postsurgical-day 3. Patient had episodes of vomiting brownish liquid with hypotension and tachycardia yesterday.  Started on IV fluids and blood pressure is stable.  He still remains tachycardic so medical consult called in today. Patient denies any chest pain or shortness of breath.  His oxygen saturation is stable on room air.  He was able to walk with physical therapy in his room. He had 2 episodes of watery bowel movement today.  He still continue to feel significantly nauseated and vomiting after trial of eating anything. He had mild abdominal discomfort.  He also complained of significant dry mouth and want to drink something but could not tolerate it.  PAST MEDICAL HISTORY:   Past Medical History:  Diagnosis Date  . Dyspnea   . Dyspnea on exertion   . Elevated lipids   . Hx of blood clots    legs  . Pulmonary emboli (HCC)   . Pulmonary embolism (HCC) 2011  . Pulmonary embolism (HCC)    2017    PAST SURGICAL HISTORY:  Past Surgical History:  Procedure Laterality Date  . APPENDECTOMY     as child  . IVC FILTER INSERTION Right 02/21/2018   Procedure: IVC FILTER INSERTION;  Surgeon: Renford Dills, MD;  Location: [facility] INVASIVE CV LAB;  Service: Cardiovascular;  Laterality: Right;  . TONSILLECTOMY AND ADENOIDECTOMY     as child  . TOTAL KNEE ARTHROPLASTY Bilateral 02/27/2018   Procedure: TOTAL KNEE BILATERAL;  Surgeon: Kennedy Bucker, MD;  Location: [facility] ORS;  Service: Orthopedics;  Laterality: Bilateral;     SOCIAL HISTORY:  Social History   Tobacco Use  . Smoking status: Former Smoker    Packs/day: 1.00    Years: 25.00    Pack years: 25.00    Types: Cigarettes    Last attempt to quit: 11/17/2015    Years since quitting: 2.2  . Smokeless tobacco: Never Used  Substance Use Topics  . Alcohol use: Yes    Comment: rare    FAMILY HISTORY:  Family History  Problem Relation Age of Onset  . Melanoma Father        face  . Colon cancer Paternal Uncle        died of colon ca - dx at age 53's  . Diabetes Neg Hx     DRUG ALLERGIES:  Allergies  Allergen Reactions  . Sulfa Antibiotics Other (See Comments)    As a child-told he had reaction to sulfa drugs.    REVIEW OF SYSTEMS:   CONSTITUTIONAL: No fever, fatigue or weakness.  EYES: No blurred or double vision.  EARS, NOSE, AND THROAT: No tinnitus or ear pain.  RESPIRATORY: No cough, shortness of breath, wheezing or hemoptysis.  CARDIOVASCULAR: No chest pain, orthopnea, edema.  GASTROINTESTINAL: No nausea, vomiting, diarrhea or abdominal pain.  GENITOURINARY: No dysuria, hematuria.  ENDOCRINE: No polyuria, nocturia,  HEMATOLOGY: No anemia, easy bruising or bleeding SKIN: No rash or lesion. MUSCULOSKELETAL: No joint pain or arthritis.   NEUROLOGIC: No tingling, numbness, weakness.  PSYCHIATRY:  No anxiety or depression.   MEDICATIONS AT HOME:  Prior to Admission medications   Medication Sig Start Date End Date Taking? Authorizing Provider  diphenhydramine-acetaminophen (TYLENOL PM) 25-500 MG TABS tablet Take 2 tablets by mouth at bedtime as needed (for sleep).    Yes [provider]  nabumetone (RELAFEN) 750 MG tablet Take 750 mg by mouth 2 (two) times daily.   Yes [provider]  simvastatin (ZOCOR) 40 MG tablet Take 60 mg by mouth at bedtime.    Yes [provider]  albuterol (PROVENTIL HFA;VENTOLIN HFA) 108 (90 Base) MCG/ACT inhaler Inhale 1-2 puffs into the lungs every 6 (six) hours as needed for  wheezing or shortness of breath. Patient not taking: Reported on 12/16/2016 11/23/15   Shaune Pollack, MD      PHYSICAL EXAMINATION:   VITAL SIGNS: Blood pressure 136/77, pulse (!) 120, temperature 98.4 F (36.9 C), resp. rate 18, height 5\' 8"  (1.727 m), weight 124.3 kg, SpO2 99 %.  GENERAL:  57 y.o.-year-old patient lying in the bed with no acute distress.  EYES: Pupils equal, round, reactive to light and accommodation. No scleral icterus. Extraocular muscles intact.  HEENT: Head atraumatic, normocephalic. Oropharynx and nasopharynx clear.  Oral mucosa dry. NECK:  Supple, no jugular venous distention. No thyroid enlargement, no tenderness.  LUNGS: Normal breath sounds bilaterally, no wheezing, rales,rhonchi or crepitation. No use of accessory muscles of respiration.  CARDIOVASCULAR: S1, S2 fast and regular. No murmurs, rubs, or gallops.  ABDOMEN: Soft, nontender, nondistended. Bowel sounds absent. No organomegaly or mass.  EXTREMITIES: No pedal edema, cyanosis, or clubbing.  NEUROLOGIC: Cranial nerves II through XII are intact. Muscle strength 5/5 in all extremities. Sensation intact. Gait not checked.  Both legs in postsurgical cooling pumps. PSYCHIATRIC: The patient is alert and oriented x 3.  SKIN: No obvious rash, lesion, or ulcer.   LABORATORY PANEL:   CBC Recent Labs  Lab 02/27/18 2201 02/28/18 0411 03/01/18 0416 03/01/18 1252 03/02/18 0840  WBC 13.3* 12.5* 9.6  --  11.9*  HGB 13.1 12.2* 11.1* 10.5* 9.7*  HCT 39.6 37.1* 33.8*  --  29.3*  PLT 216 217 168  --  202  MCV 100.3* 100.0 99.7  --  99.0  MCH 33.2 32.9 32.7  --  32.8  MCHC 33.1 32.9 32.8  --  33.1  RDW 12.1 12.3 12.6  --  13.1   ------------------------------------------------------------------------------------------------------------------  Chemistries  Recent Labs  Lab 02/27/18 2201 02/28/18 0411 03/01/18 0416 03/02/18 0840  NA  --  139 135 132*  K  --  4.8 4.0 3.8  CL  --  103 98 97*  CO2  --  29 31 25    GLUCOSE  --  171* 136* 138*  BUN  --  12 19 30*  CREATININE 0.89 0.95 1.08 1.16  CALCIUM  --  8.5* 8.6* 8.3*   ------------------------------------------------------------------------------------------------------------------ estimated creatinine clearance is 92.4 mL/min (by C-G formula based on SCr of 1.16 mg/dL). ------------------------------------------------------------------------------------------------------------------ No results for input(s): TSH, T4TOTAL, T3FREE, THYROIDAB in the last 72 hours.  Invalid input(s): FREET3   Coagulation profile Recent Labs  Lab 02/27/18 2201 02/28/18 0411 03/01/18 0416 03/02/18 0436  INR 1.03 1.09 1.05 1.14   ------------------------------------------------------------------------------------------------------------------- No results for input(s): DDIMER in the last 72 hours. -------------------------------------------------------------------------------------------------------------------  Cardiac Enzymes No results for input(s): CKMB, TROPONINI, MYOGLOBIN in the last 168 hours.  Invalid input(s): CK ------------------------------------------------------------------------------------------------------------------ Invalid input(s): POCBNP  ---------------------------------------------------------------------------------------------------------------  Urinalysis    Component Value Date/Time   COLORURINE YELLOW (A)  02/14/2018 1538   APPEARANCEUR CLEAR (A) 02/14/2018 1538   LABSPEC 1.021 02/14/2018 1538   PHURINE 5.0 02/14/2018 1538   GLUCOSEU NEGATIVE 02/14/2018 1538   HGBUR NEGATIVE 02/14/2018 1538   BILIRUBINUR NEGATIVE 02/14/2018 1538   KETONESUR NEGATIVE 02/14/2018 1538   PROTEINUR NEGATIVE 02/14/2018 1538   NITRITE NEGATIVE 02/14/2018 1538   LEUKOCYTESUR NEGATIVE 02/14/2018 1538     RADIOLOGY: Dg Chest 2 View  Result Date: 03/02/2018 CLINICAL DATA:  Tachycardia. EXAM: CHEST - 2 VIEW COMPARISON:  Radiographs of  November 17, 2015. FINDINGS: The heart size and mediastinal contours are within normal limits. Both lungs are clear. No pneumothorax or pleural effusion is noted. The visualized skeletal structures are unremarkable. IMPRESSION: No active cardiopulmonary disease. Electronically Signed   By: Lupita Raider, M.D.   On: 03/02/2018 15:17   Dg Abd 1 View  Result Date: 03/02/2018 CLINICAL DATA:  Abdominal pain for 2 days following previous knee surgery EXAM: ABDOMEN - 1 VIEW COMPARISON:  None. FINDINGS: Diffuse small and large bowel gas is noted. Some mild prominence of the small bowel is seen likely representing a postoperative ileus. No free air is seen. IVC filter is noted in satisfactory position. Degenerative changes of the lumbar spine are noted. IMPRESSION: Scattered large and small bowel gas likely related to a postoperative ileus. Correlation with the physical exam is recommended. Electronically Signed   By: Alcide Clever M.D.   On: 03/02/2018 10:17    EKG: Orders placed or performed during the hospital encounter of 02/27/18  . EKG 12-Lead  . EKG 12-Lead    IMPRESSION AND PLAN:  *Tachycardia EKG reviewed, sinus tachycardia. Patient does not have any associated shortness of breath or chest pain. His blood pressure and oxygen saturation is currently stable. Clinically he appears dehydrated. We will continue IV fluid and monitor. Added IV pain medication for better control.  *Dehydration with acute renal insufficiency This likely secondary to ileus Keep n.p.o. and continue IV fluids.  *Ileus Due to use of pain medications and postsurgical. Keep n.p.o. and give IV fluids. Patient received some stool softeners and he had 2 loose bowel movements today, recheck x-ray KUB tomorrow.  *Hyponatremia IV fluids with help.  *Acute anemia-postsurgical. Patient had some coffee colored vomiting yesterday, but as per nurse this was mostly stool colored. I will check for stool guaiac.  Slight drop  in hemoglobin could be postsurgical and IV fluids. We will continue to monitor. Added Protonix IV twice daily. Need to continue anticoagulation unless there is clear evidence of active bleeding, due to recent DVT and PE.  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 Currently advised to continue Lovenox, once start improving and confirmed no active bleeding then we can start on Eliquis.  *Bilateral total knee replacement Management per primary team. Pain medications changed to IV morphine for better control.   All the records are reviewed and case discussed with ED provider. Management plans discussed with the patient, family and they are in agreement.  CODE STATUS: Full code.    Code Status Orders  (From admission, onward)         Start     Ordered   02/27/18 2118  Full code  Continuous     02/27/18 2117        Code Status History    Date Active Date Inactive Code  Status Order ID Comments User Context   11/17/2015 1907 11/18/2015 1249 Full Code [id number]  Hower, Cletis Athensavid K, MD ED       TOTAL TIME TAKING CARE OF THIS PATIENT: 45 minutes.  Discussed with patient's nurse.  Patient's wife was present in the room during my visit.  Altamese DillingVaibhavkumar Ariq Khamis M.D on 03/02/2018   Between 7am to 6pm - Pager - [phone]  After 6pm go to [email or web address] - password [email or web address]  [facility]  Office  [phone]  CC: Primary care physician; Dione Housekeeperlmedo, Mario Ernesto, MD   Note: This dictation was prepared with Dragon dictation along with smaller phrase technology. Any transcriptional errors that result from this process are unintentional.

## 2018-03-02 NOTE — Progress Notes (Signed)
Physical Therapy Treatment Patient Details Name: Frank Rivera MRN: [id number] DOB: 10/11/1960 Today's Date: 03/02/2018    History of Present Illness 57 yo male who s/p bilateral TKA 12/3. 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     PT Comments    Pt showed good effort with all tasks and though he is still in a lot of pain and having some medically issues he remains very motivated to work with PT.  He was able to ambulate ~20 ft around the foot of the bed this afternoon with slow, but relatively steady gait; highly reliant on UEs/walker but did not need more than CGA for most of the effort (chair following for safety).  Pt was very tired with the effort (HR 130s) and needed to sit at this point.  Pt still having a hard time with quality quad control (especially on L) and b/l knees are relatively tight (again more difficulty on the L).  Pt with significantly improved tolerance with activities today, but remains completely unable to safely go home.    Follow Up Recommendations  CIR     Equipment Recommendations  Rolling walker with 5" wheels;3in1 (PT);Other (comment)    Recommendations for Other Services       Precautions / Restrictions Precautions Precautions: Fall Precaution Booklet Issued: Yes (comment) Restrictions Weight Bearing Restrictions: Yes RLE Weight Bearing: Weight bearing as tolerated LLE Weight Bearing: Weight bearing as tolerated    Mobility  Bed Mobility Overal bed mobility: Needs Assistance Bed Mobility: Sit to Supine     Supine to sit: Min assist Sit to supine: Mod assist   General bed mobility comments: Pt showed some effort in scooting back and trying to get LEs up into bed, but ultiamtely needed assist with both LEs to get back to supine  Transfers Overall transfer level: Needs assistance Equipment used: Rolling walker (2 wheeled) Transfers: Sit to/from Stand Sit to Stand: Mod assist;Max assist;+2 physical  assistance Stand pivot transfers: Max assist;+2 physical assistance       General transfer comment: Pt again needing elevated height, a lot of cuing/encouragement and considerable assist to get weight forward and up over BOS  Ambulation/Gait Ambulation/Gait assistance: Min assist Gait Distance (Feet): 20 Feet Assistive device: Rolling walker (2 wheeled)       General Gait Details: Pt continues to be very reliant on the walker/UEs but needed less cuing to keep walker at appropriate distance, did well with unweighting and advancing LEs, showed less buckling (L remains weaker than R).  He again was very fatigued with the effort, having to sit down rather abruptly; HR 130s with the effort.    Stairs             Wheelchair Mobility    Modified Rankin (Stroke Patients Only)       Balance Overall balance assessment: Needs assistance Sitting-balance support: Feet supported Sitting balance-Leahy Scale: Good Sitting balance - Comments: pain with b/l knees in gravity dependent and bent positions, but able to maintain balance     Standing balance-Leahy Scale: Poor Standing balance comment: Better confidence with using walker (though still very UE dependent) and with tolerating weight in LEs,                             Cognition Arousal/Alertness: Awake/alert Behavior During Therapy: WFL for tasks assessed/performed Overall Cognitive Status: Within Functional Limits for tasks assessed  Exercises Total Joint Exercises Ankle Circles/Pumps: AROM;10 reps Quad Sets: Strengthening;15 reps Gluteal Sets: Strengthening;15 reps Short Arc Quad: AAROM;10 reps((no AROM on L, able to do 5 AROM SAQ on R)) Heel Slides: AAROM;10 reps(resisted leg extension) Hip ABduction/ADduction: Strengthening;15 reps Knee Flexion: PROM;5 reps Goniometric ROM: R 2-93, L 5-82    General Comments        Pertinent Vitals/Pain Pain  Assessment: 0-10 Pain Score: 8  Pain Location: Right and left knees Pain Descriptors / Indicators: Sharp;Sore;Tightness;Aching Pain Intervention(s): Limited activity within patient's tolerance;Monitored during session;Premedicated before session;Repositioned    Home Living Family/patient expects to be discharged to:: Private residence Living Arrangements: Spouse/significant other;Children Available Help at Discharge: Family;Available 24 hours/day Type of Home: House Home Access: Stairs to enter Entrance Stairs-Rails: None Home Layout: Two level;Able to live on main level with bedroom/bathroom Home Equipment: Grab bars - toilet;Grab bars - tub/shower;Shower seat      Prior Function Level of Independence: Independent          PT Goals (current goals can now be found in the care plan section) Acute Rehab PT Goals Patient Stated Goal: To return home Progress towards PT goals: Progressing toward goals    Frequency    BID      PT Plan Current plan remains appropriate    Co-evaluation              AM-PAC PT "6 Clicks" Mobility   Outcome Measure  Help needed turning from your back to your side while in a flat bed without using bedrails?: A Little Help needed moving from lying on your back to sitting on the side of a flat bed without using bedrails?: A Lot Help needed moving to and from a bed to a chair (including a wheelchair)?: A Lot Help needed standing up from a chair using your arms (e.g., wheelchair or bedside chair)?: A Lot Help needed to walk in hospital room?: A Lot Help needed climbing 3-5 steps with a railing? : Total 6 Click Score: 12    End of Session Equipment Utilized During Treatment: Gait belt Activity Tolerance: Patient limited by pain;Patient limited by fatigue Patient left: with bed alarm set;with call bell/phone within reach Nurse Communication: Mobility status PT Visit Diagnosis: Unsteadiness on feet (R26.81);Difficulty in walking, not  elsewhere classified (R26.2);Other abnormalities of gait and mobility (R26.89);Muscle weakness (generalized) (M62.81);Pain     Time: [id number] PT Time Calculation (min) (ACUTE ONLY): 48 min  Charges:  $Gait Training: 8-22 mins $Therapeutic Exercise: 23-37 mins $Therapeutic Activity: 8-22 mins                     [name], DPT 03/02/2018, 2:43 PM

## 2018-03-02 NOTE — Progress Notes (Signed)
Physical Therapy Treatment Patient Details Name: Frank MayerGary A Rivera MRN: [id number] DOB: 06/18/1960 Today's Date: 03/02/2018    History of Present Illness 57 yo male who s/p bilateral TKA 12/3. 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     PT Comments    Pt continues to be very limited with functional mobility, but actually put together a small bout of ambulation today (~15 ft) with great effort, a lot of cuing and fatigue, but by far the most he has been able to do yet.  He still lacks TKE in b/l LEs, but R is nearly there and both have improved.  Pt still requires +2 assist to get to standing, even from elevated surface but this is showing improvement too.  Pt with some nausea t/o the session (did vomit last night, imaging reveals likely ileus).  Pt making gains but remains unsafe/unsable to go home and PT is still recommending rehab of some kind, preferably inpatient if able.  Pt eager to work with PT despite pain and general frustration that he is not doing as well as he'd like.    Follow Up Recommendations  CIR     Equipment Recommendations  Rolling walker with 5" wheels;3in1 (PT);Other (comment)    Recommendations for Other Services       Precautions / Restrictions Precautions Precautions: Fall Precaution Booklet Issued: Yes (comment) Restrictions Weight Bearing Restrictions: Yes RLE Weight Bearing: Weight bearing as tolerated LLE Weight Bearing: Weight bearing as tolerated    Mobility  Bed Mobility Overal bed mobility: Needs Assistance Bed Mobility: Sit to Supine     Supine to sit: Min assist Sit to supine: Min assist   General bed mobility comments: needed PT hand to pull from as well as light assist with b/l LEs to get to EOB  Transfers Overall transfer level: Needs assistance Equipment used: Rolling walker (2 wheeled) Transfers: Sit to/from Stand Sit to Stand: Mod assist;+2 physical assistance;Max assist Stand pivot  transfers: Max assist;+2 physical assistance       General transfer comment: first two attempts at standing with bed only slightly raised required max assist but ultimately he was unable to maintain standing.  Elevated bed considerably more and with heavy cuing and +2 mod assist did attain/maintain upright  Ambulation/Gait Ambulation/Gait assistance: Mod assist;Min assist Gait Distance (Feet): 15 Feet Assistive device: Rolling walker (2 wheeled)       General Gait Details: Pt initially very hesitant, anxious and needing +2 mod assist, but once we were able to get him positioned properly, using walker appropriate and calmed down he was able to take slow, guarded, but relatively safe steps with only +1 min assist.  Pt's exhausted with the effort, UEs, LEs and cardiovascularly (HR in the 140s) and needed to sit at 15 ft.   Stairs             Wheelchair Mobility    Modified Rankin (Stroke Patients Only)       Balance Overall balance assessment: Needs assistance Sitting-balance support: Feet supported Sitting balance-Leahy Scale: Good Sitting balance - Comments: pain with b/l knees in gravity dependent and bent positions, but able to maintain balance     Standing balance-Leahy Scale: Poor Standing balance comment: Pt very unstable, laborious and not confident with standing.  b/l knees bent (L>R though generally not buckling)  Highly reliant on UEs/walker  Cognition Arousal/Alertness: Awake/alert Behavior During Therapy: WFL for tasks assessed/performed Overall Cognitive Status: Within Functional Limits for tasks assessed                                        Exercises Total Joint Exercises Ankle Circles/Pumps: AROM;10 reps Quad Sets: Strengthening;15 reps Gluteal Sets: Strengthening;15 reps Short Arc Quad: AAROM;10 reps(unable to initiate against gravity AROM on L) Heel Slides: AAROM;10 reps(with resisted leg  extension) Hip ABduction/ADduction: Strengthening;15 reps Knee Flexion: PROM;5 reps Goniometric ROM: R 2-93, L 5-82    General Comments        Pertinent Vitals/Pain Pain Assessment: 0-10 Pain Score: 4 (at rest, increases to 9/10 with most acts) Pain Location: Right and left knees Pain Descriptors / Indicators: Sharp;Sore;Tightness;Aching Pain Intervention(s): Limited activity within patient's tolerance;Monitored during session;Premedicated before session;Repositioned    Home Living Family/patient expects to be discharged to:: Private residence Living Arrangements: Spouse/significant other;Children Available Help at Discharge: Family;Available 24 hours/day Type of Home: House Home Access: Stairs to enter Entrance Stairs-Rails: None Home Layout: Two level;Able to live on main level with bedroom/bathroom Home Equipment: Grab bars - toilet;Grab bars - tub/shower;Shower seat      Prior Function Level of Independence: Independent          PT Goals (current goals can now be found in the care plan section) Acute Rehab PT Goals Patient Stated Goal: To return home Progress towards PT goals: Progressing toward goals    Frequency    BID      PT Plan Current plan remains appropriate    Co-evaluation              AM-PAC PT "6 Clicks" Mobility   Outcome Measure  Help needed turning from your back to your side while in a flat bed without using bedrails?: A Lot Help needed moving from lying on your back to sitting on the side of a flat bed without using bedrails?: A Lot Help needed moving to and from a bed to a chair (including a wheelchair)?: A Lot Help needed standing up from a chair using your arms (e.g., wheelchair or bedside chair)?: A Lot Help needed to walk in hospital room?: A Lot Help needed climbing 3-5 steps with a railing? : Total 6 Click Score: 11    End of Session Equipment Utilized During Treatment: Gait belt Activity Tolerance: Patient limited by  pain;Patient limited by fatigue Patient left: with chair alarm set;with call bell/phone within reach Nurse Communication: Mobility status PT Visit Diagnosis: Unsteadiness on feet (R26.81);Difficulty in walking, not elsewhere classified (R26.2);Other abnormalities of gait and mobility (R26.89);Muscle weakness (generalized) (M62.81);Pain     Time: 0454-0981 PT Time Calculation (min) (ACUTE ONLY): 64 min  Charges:  $Gait Training: 8-22 mins $Therapeutic Exercise: 23-37 mins $Therapeutic Activity: 8-22 mins                     Malachi Pro, DPT 03/02/2018, 1:11 PM

## 2018-03-02 NOTE — Evaluation (Signed)
Occupational Therapy Evaluation Patient Details Name: Frank Rivera MRN: [id number] DOB: [birth year] Today's Date: 03/02/2018    History of Present Illness Pt is 57 yo male who was admitted to [facility] for bilateral TKA. Pt. 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    Clinical Impression   Pt. Presents with pain in bilateral knees, dizziness/nausea, weakness, limited activity tolerance, and limited functional mobility which limits  his ability to complete basic ADL and IADL functioning. Pt. resides at home with his wife, and family. Pt. was independent with ADLs, and IADL functioning: including meal preparation, and medication management. Pt. 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 Pt. Education was provided about A/E use for LE ADLs. Pt. has a reacher and long handled shoehorn available at home. Pt. is familiar with their general use. Pt. Could benefit from OT services for ADL training, A/E training, and pt. education about home modification, and DME to maximize independence. Pt. Would benefit from CIR with follow-up OT services.   Follow Up Recommendations  CIR    Equipment Recommendations       Recommendations for Other Services Rehab consult     Precautions / Restrictions Precautions Precautions: Fall Precaution Booklet Issued: Yes (comment) Restrictions Weight Bearing Restrictions: Yes RLE Weight Bearing: Weight bearing as tolerated LLE Weight Bearing: Weight bearing as tolerated      Mobility Bed Mobility Overal bed mobility: Needs Assistance                Transfers Overall transfer level: Needs assistance       Stand pivot transfers: Max assist;+2 physical assistance       General transfer comment: Mobility per PT report    Balance                                           ADL either performed or assessed with clinical judgement   ADL Overall ADL's : Needs  assistance/impaired Eating/Feeding: Independent   Grooming: Set up;Independent   Upper Body Bathing: Set up;Minimal assistance   Lower Body Bathing: Set up;Maximal assistance   Upper Body Dressing : Set up;Minimal assistance   Lower Body Dressing: Set up;Maximal assistance                 General ADL Comments: Pt. education was provided about A/E use for LE ADLs.     Vision         Perception     Praxis      Pertinent Vitals/Pain Pain Assessment: 0-10 Pain Score: 8  Pain Location: Right and left knees Pain Descriptors / Indicators: Sharp;Sore;Tightness;Aching Pain Intervention(s): Limited activity within patient's tolerance;Monitored during session;Premedicated before session;Repositioned     Hand Dominance     Extremity/Trunk Assessment Upper Extremity Assessment Upper Extremity Assessment: Overall WFL for tasks assessed           Communication Communication Communication: No difficulties   Cognition Arousal/Alertness: Awake/alert Behavior During Therapy: WFL for tasks assessed/performed Overall Cognitive Status: Within Functional Limits for tasks assessed                                     General Comments       Exercises     Shoulder Instructions  Home Living Family/patient expects to be discharged to:: Private residence Living Arrangements: Spouse/significant other;Children Available Help at Discharge: Family;Available 24 hours/day Type of Home: House Home Access: Stairs to enter Entergy CorporationEntrance Stairs-Number of Steps: 3 Entrance Stairs-Rails: None Home Layout: Two level;Able to live on main level with bedroom/bathroom     Bathroom Shower/Tub: Producer, television/film/videoWalk-in shower   Bathroom Toilet: Standard     Home Equipment: Grab bars - toilet;Grab bars - tub/shower;Shower seat          Prior Functioning/Environment Level of Independence: Independent                 OT Problem List: Decreased strength;Decreased activity  tolerance;Impaired UE functional use;Pain      OT Treatment/Interventions: Self-care/ADL training;Therapeutic exercise;Patient/family education;Therapeutic activities;DME and/or AE instruction    OT Goals(Current goals can be found in the care plan section) Acute Rehab OT Goals Patient Stated Goal: To return home OT Goal Formulation: With patient Potential to Achieve Goals: Good  OT Frequency: Min 2X/week   Barriers to D/C:            Co-evaluation              AM-PAC OT "6 Clicks" Daily Activity     Outcome Measure Help from another person eating meals?: None Help from another person taking care of personal grooming?: None Help from another person toileting, which includes using toliet, bedpan, or urinal?: A Lot Help from another person bathing (including washing, rinsing, drying)?: A Lot Help from another person to put on and taking off regular upper body clothing?: A Little Help from another person to put on and taking off regular lower body clothing?: A Lot 6 Click Score: 17   End of Session Equipment Utilized During Treatment: Gait belt  Activity Tolerance: Patient limited by pain;Other (comment)(Limited by nausea, and dizziness) Patient left: in bed  OT Visit Diagnosis: Muscle weakness (generalized) (M62.81)                Time: [id number] OT Time Calculation (min): 16 min Charges:  OT General Charges $OT Visit: 1 Visit OT Evaluation $OT Eval Low Complexity: 1 Low  Frank MessierElaine Moon Budde, MS, OTR/L  Frank Rivera 03/02/2018, 11:52 AM

## 2018-03-02 NOTE — Care Management (Signed)
RNCM spoke with CIR regarding rehab recommendation.  She will follow- patient will need to be able to walk farther and I expect that patient would need to pay up front to CIR.  [facility] is also in play.

## 2018-03-02 NOTE — Progress Notes (Addendum)
ANTICOAGULATION CONSULT NOTE - Initial Consult  Pharmacy Consult for warfarin therapy post TKR Indication: DVT w/o PE   Allergies  Allergen Reactions  . Sulfa Antibiotics Other (See Comments)    As a child-told he had reaction to sulfa drugs.    Vital Signs: Temp: 98.4 F (36.9 C) (12/06 0733) Temp Source: Oral (12/05 2330) BP: 136/77 (12/06 0733) Pulse Rate: 120 (12/06 0733)  Labs: Recent Labs    02/27/18 2201 02/28/18 0411 03/01/18 0416 03/01/18 1252 03/02/18 0436  HGB 13.1 12.2* 11.1* 10.5*  --   HCT 39.6 37.1* 33.8*  --   --   PLT 216 217 168  --   --   LABPROT 13.4 14.0 13.6  --  14.5  INR 1.03 1.09 1.05  --  1.14  CREATININE 0.89 0.95 1.08  --   --     Estimated Creatinine Clearance: 99.3 mL/min (by C-G formula based on SCr of 1.08 mg/dL).   Medical History: Past Medical History:  Diagnosis Date  . Dyspnea   . Dyspnea on exertion   . Elevated lipids   . Hx of blood clots    legs  . Pulmonary emboli (HCC)   . Pulmonary embolism (HCC) 2011  . Pulmonary embolism (HCC)    2017    Medications:  Scheduled:  . docusate sodium  100 mg Oral BID  . enoxaparin (LOVENOX) injection  40 mg Subcutaneous Q12H  . gabapentin  300 mg Oral TID  . simvastatin  60 mg Oral QHS  . tamsulosin  0.8 mg Oral Daily  . warfarin  2.5 mg Oral q1800    Assessment: Patient presents for pain in the lower extremities w/ a h/o DVT and subsequent PE identified a few years ago and was placed on anticoagulation; now presents with thrombophlebitis and placement of IVC filter and bilateral total knee replacements. Orthopedic would like to restart patient's warfarin. Patient was apparently on warfarin PTA historically (was discontinued due to patient preference per med rec history): Warfarin 2.5 mg daily (was taking half of a 5 mg tablet)  Date INR Warfarin Dose  12/3 1.03   12/4 1.09 2.5 mg  12/5 1.05 2.5 mg  12/6 1.14 3 mg      Goal of Therapy:  INR 2-3 Monitor platelets by  anticoagulation protocol: Yes   Plan:  INR is trending up. Warfarin still not at steady state, takes about 5 days - pt on low dose warfarin because he might be sensitive to it since he was on a low dose in the past. Recommend continuing 2.5 mg daily regimen at discharge. Daily INRs ordered. Pt also on enoxaparin. Hgb/Plt slightly trending down. Vomited brown emesis- no blood noted. IM team was consulted. Plan to order warfarin 3 mg (20% increase) @ 1800 tonight to help increase INR faster. No blood was noted in the vomit per PA.  Paschal DoppKishan Mahayla Haddaway, PharmD, BCPS Clinical Pharmacist 03/02/2018

## 2018-03-02 NOTE — Progress Notes (Addendum)
   Subjective: 3 Days Post-Op Procedure(s) (LRB): TOTAL KNEE BILATERAL (Bilateral) Patient reports pain as 9 on 0-10 scale.  Vomiting and abd pain. Brown vomit, no blood. NO CP/SOB. Patient is well, and has had no acute complaints or problems Denies any CP, SOB, ABD pain. We will continue with physical therapy today.    Objective: Vital signs in last 24 hours: Temp:  [98.2 F (36.8 C)-98.4 F (36.9 C)] 98.4 F (36.9 C) (12/06 0733) Pulse Rate:  [113-120] 120 (12/06 0733) Resp:  [18-19] 18 (12/06 0733) BP: (97-152)/(59-93) 136/77 (12/06 0733) SpO2:  [93 %-99 %] 99 % (12/06 0733)  Intake/Output from previous day: 12/05 0701 - 12/06 0700 In: 1805.6 [P.O.:240; I.V.:1065.6; IV Piggyback:500] Out: 900 [Urine:900] Intake/Output this shift: No intake/output data recorded.  Recent Labs    02/27/18 2201 02/28/18 0411 03/01/18 0416 03/01/18 1252 03/02/18 0840  HGB 13.1 12.2* 11.1* 10.5* 9.7*   Recent Labs    03/01/18 0416 03/02/18 0840  WBC 9.6 11.9*  RBC 3.39* 2.96*  HCT 33.8* 29.3*  PLT 168 202   Recent Labs    03/01/18 0416 03/02/18 0840  NA 135 132*  K 4.0 3.8  CL 98 97*  CO2 31 25  BUN 19 30*  CREATININE 1.08 1.16  GLUCOSE 136* 138*  CALCIUM 8.6* 8.3*   Recent Labs    03/01/18 0416 03/02/18 0436  INR 1.05 1.14    EXAM General - Patient is Alert, Appropriate and Oriented  ABD - slightly tender, mildy distended. BS present. Bilateral Extremities - Neurovascular intact Sensation intact distally Intact pulses distally Dorsiflexion/Plantar flexion intact No cellulitis present Compartment soft Dressing - dressing C/D/I and no drainage.  No active drainage. Motor Function - intact, moving left and right feet and toes well on exam.   Past Medical History:  Diagnosis Date  . Dyspnea   . Dyspnea on exertion   . Elevated lipids   . Hx of blood clots    legs  . Pulmonary emboli (HCC)   . Pulmonary embolism (HCC) 2011  . Pulmonary embolism (HCC)    2017    Assessment/Plan:   3 Days Post-Op Procedure(s) (LRB): TOTAL KNEE BILATERAL (Bilateral) Active Problems:   S/P TKR (total knee replacement) using cement, bilateral  Estimated body mass index is 41.67 kg/m as calculated from the following:   Height as of this encounter: 5\' 8"  (1.727 m).   Weight as of this encounter: 124.3 kg. Advance diet Up with therapy  Needs BM.  Abdominal pain/vomiting - KUB ordered. Check CBC/BMP. IM consult ordered. Patient tachycardic with abd pain. On lovenox and coumadin for previous PE. IVC filter placed EKG ordered   DVT Prophylaxis - Lovenox, Coumadin, TED hose and SCDs Weight-Bearing as tolerated to left and right leg   T. Cranston Neighborhris , PA-C [facility] 03/02/2018, 10:01 AM

## 2018-03-02 NOTE — Progress Notes (Addendum)
Inpatient Rehabilitation-Admissions Coordinator   Spoke with pt via phone to discuss CIR program and determine interest. Pt stated he was unsure how he would manage at home if he was to DC soon and did state interest in CIR program. AC discussed activity tolerance that pt would need to demonstrate prior to be admitted as well as pain tolerance.  With pt permission, AC checked into pt's benefits through both his [employer] program and through [employer] Ahern-a designated contact. Currently pt does not have therapy service coverage. Per Rosanne AshingJim, his service only pays for procedures and does not pay for therapy/post acute rehab. Pt would be responsible for the estimated daily cost of care for CIR. AC has attempted to call patient to share this info, but has been unable to get in touch. AC will follow up with pt Monday.   Please call if questions.   Nanine MeansKelly Lafaye Mcelmurry, OTR/L  Rehab Admissions Coordinator  [phone] 03/02/2018 6:03 PM

## 2018-03-02 NOTE — Progress Notes (Signed)
Dr. Rosita KeaMenz returned the page. No new orders at this time.

## 2018-03-02 NOTE — Progress Notes (Signed)
PT complains of having some stomach discomfort and feels like he want to have Bm. Bed pan offered and he had small liquid stool. Pt vomited brown emesis. Bowel sounds present. Dr. Rosita KeaMenz paged.

## 2018-03-03 ENCOUNTER — Inpatient Hospital Stay: Payer: Self-pay

## 2018-03-03 LAB — BASIC METABOLIC PANEL
Anion gap: 11 (ref 5–15)
BUN: 29 mg/dL — ABNORMAL HIGH (ref 6–20)
CALCIUM: 8.4 mg/dL — AB (ref 8.9–10.3)
CO2: 25 mmol/L (ref 22–32)
CREATININE: 0.91 mg/dL (ref 0.61–1.24)
Chloride: 101 mmol/L (ref 98–111)
GFR calc Af Amer: >60 mL/min (ref >60)
GFR calc non Af Amer: >60 mL/min (ref >60)
Glucose, Bld: 125 mg/dL — ABNORMAL HIGH (ref 70–99)
Potassium: 3.8 mmol/L (ref 3.5–5.1)
Sodium: 137 mmol/L (ref 135–145)

## 2018-03-03 LAB — CBC
HCT: 28.9 % — ABNORMAL LOW (ref 39.0–52.0)
Hemoglobin: 9.4 g/dL — ABNORMAL LOW (ref 13.0–17.0)
MCH: 32.5 pg (ref 26.0–34.0)
MCHC: 32.5 g/dL (ref 30.0–36.0)
MCV: 100 fL (ref 80.0–100.0)
Platelets: 183 10*3/uL (ref 150–400)
RBC: 2.89 MIL/uL — ABNORMAL LOW (ref 4.22–5.81)
RDW: 12.9 % (ref 11.5–15.5)
WBC: 9.6 10*3/uL (ref 4.0–10.5)
nRBC: 0.5 % — ABNORMAL HIGH (ref 0.0–0.2)

## 2018-03-03 LAB — PROTIME-INR
INR: 1.34
Prothrombin Time: 16.4 seconds — ABNORMAL HIGH (ref 11.4–15.2)

## 2018-03-03 MED ORDER — OXYCODONE-ACETAMINOPHEN 5-325 MG PO TABS
1.0000 | ORAL_TABLET | ORAL | Status: DC | PRN
  Administered 2018-03-03 (×2): 2 via ORAL
  Administered 2018-03-03: 1 via ORAL
  Administered 2018-03-03 – 2018-03-06 (×13): 2 via ORAL
  Administered 2018-03-06: 1 via ORAL
  Filled 2018-03-03 (×3): qty 2
  Filled 2018-03-03: qty 1
  Filled 2018-03-03 (×13): qty 2

## 2018-03-03 MED ORDER — WARFARIN SODIUM 5 MG PO TABS
5.0000 mg | ORAL_TABLET | Freq: Once | ORAL | Status: AC
  Administered 2018-03-03: 5 mg via ORAL
  Filled 2018-03-03: qty 1

## 2018-03-03 MED ORDER — MORPHINE SULFATE (PF) 2 MG/ML IV SOLN: 2.0000 mg | INTRAVENOUS | Status: DC | PRN

## 2018-03-03 MED ORDER — METOPROLOL TARTRATE 25 MG PO TABS
12.5000 mg | ORAL_TABLET | Freq: Two times a day (BID) | ORAL | Status: DC
  Administered 2018-03-03 (×2): 12.5 mg via ORAL
  Filled 2018-03-03 (×2): qty 1

## 2018-03-03 NOTE — Plan of Care (Signed)

## 2018-03-03 NOTE — Progress Notes (Signed)
ANTICOAGULATION CONSULT NOTE - follow up Consult  Pharmacy Consult for warfarin therapy post TKR Indication: DVT w/o PE   Allergies  Allergen Reactions  . Sulfa Antibiotics Other (See Comments)    As a child-told he had reaction to sulfa drugs.    Vital Signs: Temp: 98.2 F (36.8 C) (12/07 0756) Temp Source: Oral (12/07 0756) BP: 127/67 (12/07 0756) Pulse Rate: 117 (12/07 0756)  Labs: Recent Labs    03/01/18 0416  03/02/18 0436 03/02/18 0840 03/03/18 0308  HGB 11.1*   < >  --  9.7* 9.4*  HCT 33.8*  --   --  29.3* 28.9*  PLT 168  --   --  202 183  LABPROT 13.6  --  14.5  --  16.4*  INR 1.05  --  1.14  --  1.34  CREATININE 1.08  --   --  1.16 0.91   < > = values in this interval not displayed.    Estimated Creatinine Clearance: 117.8 mL/min (by C-G formula based on SCr of 0.91 mg/dL).   Assessment: Patient presents for pain in the lower extremities w/ a h/o DVT and subsequent PE identified a few years ago and was placed on anticoagulation; now presents with thrombophlebitis and placement of IVC filter and bilateral total knee replacements. Orthopedic would like to restart patient's warfarin. Patient was apparently on warfarin PTA historically (was discontinued due to patient preference per med rec history): Warfarin 2.5 mg daily (was taking half of a 5 mg tablet)  Date INR Warfarin Dose  12/3 1.03   12/4 1.09 2.5 mg  12/5 1.05 2.5 mg  12/6 1.14 3 mg   12/7 1.34    Warfarin still not at steady state, takes about 5 days - pt on low dose warfarin because he might be sensitive to it since he was on a low dose in the past. Recommend continuing 2.5 mg daily regimen at discharge. Daily INRs ordered. Pt also on enoxaparin. Hgb/Plt slightly trending down.    Goal of Therapy:  INR 2-3 Monitor platelets by anticoagulation protocol: Yes   Plan:  INR is trending up but subtherapeutic on day 4 of therapy. Warf score 8 points.  Will give higher dose of 5 mg for tonight. Follow  up INR in AM.    Crist FatHannah Joyanne Eddinger, PharmD, BCPS Clinical Pharmacist 03/03/2018 1:49 PM

## 2018-03-03 NOTE — Progress Notes (Signed)
Physical Therapy Treatment Patient Details Name: Frank Rivera MRN: [id number] DOB: 07-23-1960 Today's Date: 03/03/2018    History of Present Illness 57 yo male who s/p bilateral TKA 12/3. 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     PT Comments    Upon PT arrival, pt standing with nursing assist but then pt sat down in recliner d/t not feeling well but requesting to go to [facility].  Pt able to stand and transfer to [facility] with 2 assist and RW use.  Nursing contacted PT when pt was finished toileting but pt was feeling lightheaded so ambulation deferred (although pt able to ambulate about 5 feet with 2 assist and RW use BSC to bed); vitals stable.  Performed B LE exercises in bed; pt requiring cueing for correct technique.  PT recommending CIR but per notes and pt/pt's family, plan for pt to discharge home.  Pt's wife present and therapist performed extensive education regarding modifications to home set-up (ex: elevated chair heights with firmer surfaces and B armrests; elevated bed height and BSC height to make transfers easier; 2 assist with functional mobility for safety (using RW) with additional w/c follow if pt ambulating any distance for safety): pt and pt's wife verbalizing appropriate understanding and also report they have a w/c for pt and plan to "bump" pt up steps into home with 2+ assist.  Pt's wife requesting to be present for PT session tomorrow and time arranged.  Will continue to progress pt with strengthening, knee ROM, and progressive functional mobility per pt tolerance.   Follow Up Recommendations  CIR     Equipment Recommendations  Rolling walker with 5" wheels;3in1 (PT);Other (comment)    Recommendations for Other Services OT consult     Precautions / Restrictions Precautions Precautions: Fall Precaution Booklet Issued: Yes (comment) Restrictions Weight Bearing Restrictions: Yes RLE Weight Bearing: Weight bearing as  tolerated LLE Weight Bearing: Weight bearing as tolerated    Mobility  Bed Mobility Overal bed mobility: Needs Assistance Bed Mobility: Sit to Supine       Sit to supine: Min assist;Mod assist;HOB elevated   General bed mobility comments: assist for trunk and B LE's sit to supine (increased assist provided d/t pt feeling lightheaded)  Transfers Overall transfer level: Needs assistance Equipment used: Rolling walker (2 wheeled) Transfers: Sit to/from Stand Sit to Stand: Mod assist;+2 physical assistance Stand pivot transfers: Min assist;Mod assist;+2 physical assistance(recliner to [facility])       General transfer comment: vc's for hand placement and scooting feet back prior and then during standing; vc's for stepping feet forward as sitting; assist to initiate and come to full stand  Ambulation/Gait Ambulation/Gait assistance: Min assist;+2 physical assistance;+2 safety/equipment Gait Distance (Feet): 5 Feet(BSC to bed) Assistive device: Rolling walker (2 wheeled) Gait Pattern/deviations: Step-to pattern;Wide base of support Gait velocity: decreased   General Gait Details: short steps with forward lean on walker, verbal cues to stand upright; pt sidestepping from [facility] past recliner to bed d/t feeling lightheaded; mild B knee flexion during B LE stance phase   Stairs             Wheelchair Mobility    Modified Rankin (Stroke Patients Only)       Balance Overall balance assessment: Needs assistance Sitting-balance support: Feet supported Sitting balance-Leahy Scale: Good Sitting balance - Comments: steady sitting reaching within BOS   Standing balance support: Bilateral upper extremity supported Standing balance-Leahy Scale: Poor Standing balance comment: pt  requiring B UE support for static standing balance                            Cognition Arousal/Alertness: Awake/alert Behavior During Therapy: Anxious Overall Cognitive Status: Within Functional  Limits for tasks assessed                                        Exercises Total Joint Exercises Quad Sets: AROM;Strengthening;Both;10 reps;Supine Heel Slides: AAROM;Strengthening;Both;10 reps;Supine Straight Leg Raises: AAROM;Strengthening;Both;10 reps;Supine    General Comments   Nursing cleared pt for participation in physical therapy and present during session.  Pt agreeable to PT session.  Pt's wife present during session.      Pertinent Vitals/Pain Pain Assessment: 0-10 Pain Score: 6  Pain Location: B knees Pain Descriptors / Indicators: Sore;Tender Pain Intervention(s): Limited activity within patient's tolerance;Monitored during session;Premedicated before session;Repositioned;Patient requesting pain meds-RN notified;RN gave pain meds during session    Home Living                      Prior Function            PT Goals (current goals can now be found in the care plan section) Acute Rehab PT Goals Patient Stated Goal: To return home PT Goal Formulation: With patient/family Time For Goal Achievement: 03/14/18 Potential to Achieve Goals: Fair Progress towards PT goals: Progressing toward goals    Frequency    BID      PT Plan Current plan remains appropriate    Co-evaluation              AM-PAC PT "6 Clicks" Mobility   Outcome Measure  Help needed turning from your back to your side while in a flat bed without using bedrails?: A Little Help needed moving from lying on your back to sitting on the side of a flat bed without using bedrails?: A Lot Help needed moving to and from a bed to a chair (including a wheelchair)?: Total Help needed standing up from a chair using your arms (e.g., wheelchair or bedside chair)?: Total Help needed to walk in hospital room?: Total Help needed climbing 3-5 steps with a railing? : Total 6 Click Score: 9    End of Session Equipment Utilized During Treatment: Gait belt Activity Tolerance:  Patient limited by pain;Other (comment)(Limited d/t c/o lightheadedness) Patient left: in bed;with call bell/phone within reach;with bed alarm set;with family/visitor present;with nursing/sitter in room;Other (comment)(deferred set-up in bed (nursing present to assist pt with toileting needs)) Nurse Communication: Mobility status;Precautions;Patient requests pain meds;Weight bearing status PT Visit Diagnosis: Unsteadiness on feet (R26.81);Difficulty in walking, not elsewhere classified (R26.2);Other abnormalities of gait and mobility (R26.89);Muscle weakness (generalized) (M62.81);Pain Pain - Right/Left: Left(Right) Pain - part of body: Knee     Time: [phone] PT Time Calculation (min) (ACUTE ONLY): 28 min  Charges:  $Therapeutic Exercise: 23-37 mins $Therapeutic Activity: 8-22 mins                    Hendricks LimesEmily Carrson Lightcap, PT 03/03/18, 4:22 PM [phone]

## 2018-03-03 NOTE — Progress Notes (Signed)
Occupational Therapy Treatment Patient Details Name: Frank Rivera MRN: [id number] DOB: [birth year] Today's Date: 03/03/2018    History of present illness 57 yo male who s/p bilateral TKA 12/3. 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    OT comments  Pt tolerates treatment moderately well this date. OT presents while PT treating and assists with transfers and mobility d/t pt with very limited actvity tolerance at this time in setting of high pain levels in b/l knees. Pt requires MOD A x2 for sit to stand from EOB with FWW with bed elevated ~10" and increased time to scoot feet back/flex knees more. Pt with very limited standing tolerance requiring chair follow for safety. OT provides MOD cueing for improved posture in standing as pt presents with flexed hips as compensatory mechanism d/t pain. Pt requires MOD A x2 for stand to sit and while sitting in chair, OT engages pt in UE exercise education including form and technique with visual demonstration. Pt also re-educated on AE available for LB dressing tasks, but declines to perform with OT at this time d/t pain increase after fxl mobility performance. Pt left sitting in chair with call bell and all necessities in reach.    Follow Up Recommendations  CIR    Equipment Recommendations  Other (comment)(TBD at next level of care. )    Recommendations for Other Services Rehab consult    Precautions / Restrictions Precautions Precautions: Fall Precaution Booklet Issued: Yes (comment) Restrictions Weight Bearing Restrictions: Yes RLE Weight Bearing: Weight bearing as tolerated LLE Weight Bearing: Weight bearing as tolerated       Mobility Bed Mobility      General bed mobility comments: Pt sitting up EOB when OT presents during PT session to cotx.   Transfers Overall transfer level: Needs assistance Equipment used: Rolling walker (2 wheeled) Transfers: Sit to/from Stand Sit to Stand: Mod  assist;Max assist;+2 physical assistance Stand pivot transfers: Max assist;+2 physical assistance       General transfer comment: Pt requires bed elevated slightly to stand as well as increased time to scoot feet back/bend knees more in prep for standing.     Balance Overall balance assessment: Needs assistance Sitting-balance support: Feet supported Sitting balance-Leahy Scale: Good Sitting balance - Comments: pain with b/l knees in gravity dependent and bent positions, but able to maintain balance   Standing balance support: Bilateral upper extremity supported Standing balance-Leahy Scale: Poor Standing balance comment: Pt slightly shakey, but no overt LOB. Noted to lean forward with bottom positioned posteriorly-flexed at the hips in standing, encouraged to extend for improved posture.                            ADL either performed or assessed with clinical judgement   ADL Overall ADL's : Needs assistance/impaired     Grooming: Set up;Independent               Lower Body Dressing: Set up;Maximal assistance   Toilet Transfer: +2 for physical assistance;BSC                   Vision Baseline Vision/History: No visual deficits     Perception     Praxis      Cognition Arousal/Alertness: Awake/alert Behavior During Therapy: WFL for tasks assessed/performed Overall Cognitive Status: Within Functional Limits for tasks assessed  Exercises  Other Exercises Other Exercises: Pt educated on tricep push-ups from armrests and performs x10 with min cues for form.  Other Exercises: Pt educated on use of AE for LB dressing, but declines to perform after fxl mobility d/t increased pain.  Other Exercises: Pt re-educated on falls prevention strategies.    Shoulder Instructions       General Comments      Pertinent Vitals/ Pain       Pain Assessment: 0-10 Pain Score: 8  Pain Location: Right and  left knees Pain Descriptors / Indicators: Sore;Aching Pain Intervention(s): Limited activity within patient's tolerance;Monitored during session;Premedicated before session  Home Living                                          Prior Functioning/Environment              Frequency  Min 2X/week        Progress Toward Goals  OT Goals(current goals can now be found in the care plan section)  Progress towards OT goals: Progressing toward goals  Acute Rehab OT Goals Patient Stated Goal: To return home OT Goal Formulation: With patient Potential to Achieve Goals: Good  Plan Discharge plan remains appropriate    Co-evaluation                 AM-PAC OT "6 Clicks" Daily Activity     Outcome Measure   Help from another person eating meals?: None Help from another person taking care of personal grooming?: None Help from another person toileting, which includes using toliet, bedpan, or urinal?: A Lot Help from another person bathing (including washing, rinsing, drying)?: A Lot Help from another person to put on and taking off regular upper body clothing?: A Little Help from another person to put on and taking off regular lower body clothing?: A Lot 6 Click Score: 17    End of Session Equipment Utilized During Treatment: Gait belt;Rolling walker  OT Visit Diagnosis: Muscle weakness (generalized) (M62.81)   Activity Tolerance Patient limited by pain;Other (comment)   Patient Left in chair;with chair alarm set(with polarcare applied. )   Nurse Communication Mobility status(CNA assists with chair follow during fxl mobility and becomes educated on mobility status. )        Time: [id number] OT Time Calculation (min): 23 min  Charges: OT General Charges $OT Visit: 1 Visit OT Treatments $Self Care/Home Management : 8-22 mins $Therapeutic Activity: 8-22 mins  Frank Rivera Frank Ruddock, MS, OTR/L ascom [phone] or [phone] 03/03/18, 10:05 AM

## 2018-03-03 NOTE — Progress Notes (Signed)
[employer] - [place] [facility]   PATIENT NAME: Frank Rivera    MR#:  [id number]  DATE OF BIRTH:  11/25/1960  SUBJECTIVE:  CHIEF COMPLAINT:  No chief complaint on file. Still have tachycardia.  Had 2-3 loose bowel movements yesterday and feeling less pain and no nausea today.  REVIEW OF SYSTEMS:  CONSTITUTIONAL: No fever, fatigue or weakness.  EYES: No blurred or double vision.  EARS, NOSE, AND THROAT: No tinnitus or ear pain.  RESPIRATORY: No cough, shortness of breath, wheezing or hemoptysis.  CARDIOVASCULAR: No chest pain, orthopnea, edema.  GASTROINTESTINAL: No nausea, vomiting, diarrhea or abdominal pain.  GENITOURINARY: No dysuria, hematuria.  ENDOCRINE: No polyuria, nocturia,  HEMATOLOGY: No anemia, easy bruising or bleeding SKIN: No rash or lesion. MUSCULOSKELETAL: No joint pain or arthritis.   NEUROLOGIC: No tingling, numbness, weakness.  PSYCHIATRY: No anxiety or depression.   ROS  DRUG ALLERGIES:   Allergies  Allergen Reactions  . Sulfa Antibiotics Other (See Comments)    As a child-told he had reaction to sulfa drugs.    VITALS:  Blood pressure 118/77, pulse (!) 116, temperature 98.2 F (36.8 C), temperature source Oral, resp. rate 18, height 5\' 8"  (1.727 m), weight 124.3 kg, SpO2 100 %.  PHYSICAL EXAMINATION:   GENERAL:  57 y.o.-year-old patient lying in the bed with no acute distress.  EYES: Pupils equal, round, reactive to light and accommodation. No scleral icterus. Extraocular muscles intact.  HEENT: Head atraumatic, normocephalic. Oropharynx and nasopharynx clear.  Oral mucosa dry. NECK:  Supple, no jugular venous distention. No thyroid enlargement, no tenderness.  LUNGS: Normal breath sounds bilaterally, no wheezing, rales,rhonchi or crepitation. No use of accessory muscles of respiration.  CARDIOVASCULAR: S1, S2 fast and regular. No murmurs, rubs, or gallops.  ABDOMEN: Soft, nontender, nondistended. Bowel sounds sluggish. No  organomegaly or mass.  EXTREMITIES: No pedal edema, cyanosis, or clubbing.  NEUROLOGIC: Cranial nerves II through XII are intact. Muscle strength 5/5 in all extremities. Sensation intact. Gait not checked.  Both legs in postsurgical cooling pumps. PSYCHIATRIC: The patient is alert and oriented x 3.  SKIN: No obvious rash, lesion, or ulcer.   Physical Exam LABORATORY PANEL:   CBC Recent Labs  Lab 03/03/18 0308  WBC 9.6  HGB 9.4*  HCT 28.9*  PLT 183   ------------------------------------------------------------------------------------------------------------------  Chemistries  Recent Labs  Lab 03/03/18 0308  NA 137  K 3.8  CL 101  CO2 25  GLUCOSE 125*  BUN 29*  CREATININE 0.91  CALCIUM 8.4*   ------------------------------------------------------------------------------------------------------------------  Cardiac Enzymes No results for input(s): TROPONINI in the last 168 hours. ------------------------------------------------------------------------------------------------------------------  RADIOLOGY:  Dg Chest 2 View  Result Date: 03/02/2018 CLINICAL DATA:  Tachycardia. EXAM: CHEST - 2 VIEW COMPARISON:  Radiographs of November 17, 2015. FINDINGS: The heart size and mediastinal contours are within normal limits. Both lungs are clear. No pneumothorax or pleural effusion is noted. The visualized skeletal structures are unremarkable. IMPRESSION: No active cardiopulmonary disease. Electronically Signed   By: Lupita RaiderJames  Green Jr, M.D.   On: 03/02/2018 15:17   Dg Abd 1 View  Result Date: 03/03/2018 CLINICAL DATA:  57 year old male with abdominal pain, constipation and suspected postoperative ileus EXAM: ABDOMEN - 1 VIEW COMPARISON:  Prior abdominal radiographs 03/02/2017 FINDINGS: Persisting gaseous distension of the colon. Gas is also noted within loops of small bowel in the mid abdomen although the amount of gas within small bowel has decreased compared to the prior radiograph. A  Bard [place]ali potentially retrievable IVC  filter is noted within the expected location. Mild bilateral hip joint osteoarthritis. No acute osseous abnormality. IMPRESSION: 1. Decreasing gaseous distension of the small bowel but persistent gas throughout the colon. Findings suggest improving ileus. 2. Potentially retrievable IVC filter in place. Electronically Signed   By: Malachy Moan M.D.   On: 03/03/2018 09:26   Dg Abd 1 View  Result Date: 03/02/2018 CLINICAL DATA:  Abdominal pain for 2 days following previous knee surgery EXAM: ABDOMEN - 1 VIEW COMPARISON:  None. FINDINGS: Diffuse small and large bowel gas is noted. Some mild prominence of the small bowel is seen likely representing a postoperative ileus. No free air is seen. IVC filter is noted in satisfactory position. Degenerative changes of the lumbar spine are noted. IMPRESSION: Scattered large and small bowel gas likely related to a postoperative ileus. Correlation with the physical exam is recommended. Electronically Signed   By: Alcide Clever M.D.   On: 03/02/2018 10:17    ASSESSMENT AND PLAN:   Active Problems:   S/P TKR (total knee replacement) using cement, bilateral  *Tachycardia EKG reviewed, sinus tachycardia. Patient does not have any associated shortness of breath or chest pain. His blood pressure and oxygen saturation is currently stable. Clinically he appears dehydrated. We will continue IV fluid and monitor. Added IV pain medication for better control. We will add small dose of oral metoprolol for now.  *Dehydration with acute renal insufficiency This likely secondary to ileus Keep n.p.o. and continue IV fluids. As ileus is improving now, will start on liquid diet and decrease IV fluids.  *Ileus Due to use of pain medications and postsurgical. Keep n.p.o. and give IV fluids. Patient received some stool softeners and he had 2 loose bowel movements today, recheck x-ray KUB showed some improvement. Will start on  liquid diet and monitor.  *Hyponatremia IV fluids with help.  *Acute anemia-postsurgical. Patient had some coffee colored vomiting yesterday, but as per nurse this was mostly stool colored. I will check for stool guaiac.  Slight drop in hemoglobin could be postsurgical and IV fluids. We will continue to monitor. Added Protonix IV twice daily. Need to continue anticoagulation unless there is clear evidence of active bleeding, due to recent DVT and PE.  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 Currently advised to continue Lovenox, once start improving and confirmed no active bleeding then we can start on Eliquis.  *Bilateral total knee replacement Management per primary team. Pain medications changed to IV morphine for better control. Patient was feeling drowsy and was having some confusion "loopy" with morphine and requesting to change back to oral as now we are starting diet.    All the records are reviewed and case discussed with Care Management/Social Workerr. Management plans discussed with the patient, family and they are in agreement.  CODE STATUS: Full code.  TOTAL TIME TAKING CARE OF THIS PATIENT: 35 minutes.   Discussed with wife in the room  POSSIBLE D/C IN 1-2 DAYS, DEPENDING ON CLINICAL CONDITION.   Altamese Dilling M.D on 03/03/2018   Between 7am to 6pm - Pager - [phone]  After 6pm go to [email or web address] - password [employer]  [facility]  Office  [phone]  CC: Primary care physician; Dione Housekeeper, MD  Note: This dictation was prepared with Dragon dictation along with smaller phrase technology. Any transcriptional errors that  result from this process are unintentional.

## 2018-03-03 NOTE — Progress Notes (Signed)
   Subjective: 4 Days Post-Op Procedure(s) (LRB): TOTAL KNEE BILATERAL (Bilateral) Patient reports pain as 9 on 0-10 scale.  Resolving vomiting.  Mild abdominal pain.  Passing gas.  Bowel movement yesterday.  NO CP/SOB. Patient is well, and has had no acute complaints or problems Denies any CP, SOB, ABD pain. We will continue with physical therapy today.    Objective: Vital signs in last 24 hours: Temp:  [98.2 F (36.8 C)-98.4 F (36.9 C)] 98.2 F (36.8 C) (12/06 2342) Pulse Rate:  [111-120] 111 (12/06 2342) Resp:  [18] 18 (12/06 2342) BP: (135-136)/(77-78) 135/78 (12/06 2342) SpO2:  [98 %-99 %] 98 % (12/06 2342)  Intake/Output from previous day: 12/06 0701 - 12/07 0700 In: 3522.6 [P.O.:480; I.V.:3042.6] Out: 1000 [Urine:1000] Intake/Output this shift: Total I/O In: 3522.6 [P.O.:480; I.V.:3042.6] Out: 1000 [Urine:1000]  Recent Labs    03/01/18 0416 03/01/18 1252 03/02/18 0840 03/03/18 0308  HGB 11.1* 10.5* 9.7* 9.4*   Recent Labs    03/02/18 0840 03/03/18 0308  WBC 11.9* 9.6  RBC 2.96* 2.89*  HCT 29.3* 28.9*  PLT 202 183   Recent Labs    03/02/18 0840 03/03/18 0308  NA 132* 137  K 3.8 3.8  CL 97* 101  CO2 25 25  BUN 30* 29*  CREATININE 1.16 0.91  GLUCOSE 138* 125*  CALCIUM 8.3* 8.4*   Recent Labs    03/02/18 0436 03/03/18 0308  INR 1.14 1.34    EXAM General - Patient is Alert, Appropriate and Oriented  ABD - slightly tender, mildy distended. BS present. Bilateral Extremities - Neurovascular intact Sensation intact distally Intact pulses distally Dorsiflexion/Plantar flexion intact No cellulitis present Compartment soft Dressing - dressing C/D/I and no drainage.  No active drainage. Motor Function - intact, moving left and right feet and toes well on exam.   Past Medical History:  Diagnosis Date  . Dyspnea   . Dyspnea on exertion   . Elevated lipids   . Hx of blood clots    legs  . Pulmonary emboli (HCC)   . Pulmonary embolism  (HCC) 2011  . Pulmonary embolism (HCC)    2017    Assessment/Plan:   4 Days Post-Op Procedure(s) (LRB): TOTAL KNEE BILATERAL (Bilateral) Active Problems:   S/P TKR (total knee replacement) using cement, bilateral  Estimated body mass index is 41.67 kg/m as calculated from the following:   Height as of this encounter: 5\' 8"  (1.727 m).   Weight as of this encounter: 124.3 kg. Advance diet Up with therapy   On lovenox and coumadin for previous PE. IVC filter placed EKG ordered  DVT Prophylaxis - Lovenox, Coumadin, TED hose and SCDs Weight-Bearing as tolerated to left and right leg KUB this morning. Plan for discharge home tomorrow.  [name] Parsa Rickett, PA-C [facility] 03/03/2018, 6:39 AM

## 2018-03-03 NOTE — Progress Notes (Signed)
Physical Therapy Treatment Patient Details Name: Frank Rivera MRN: [id number] DOB: 14-Oct-1960 Today's Date: 03/03/2018    History of Present Illness 57 yo male who s/p bilateral TKA 12/3. 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     PT Comments    Pt in bed, ready for session.  Participated in exercises as described below.  To edge of bed with min a x 1 for support L knee.  Heavy use of rail and increased time.  Stood with mod a x 2.  2 attempts required with bed elevated.  On second attempt he was able to ambulate 12' with walker and min a x 2 with wheelchair follow for safety.  Requested to sit due to fatigue.  HR noted to be 144 upon sitting and gradually returned to baseline with rest.    Co-tx with OT for gait and transfers to allow for safe mobility.  8:18-8:30 without co-tx 8:30-8:46 co-tx.  One unit billed for PT sesssion.   Follow Up Recommendations  CIR     Equipment Recommendations  Rolling walker with 5" wheels;3in1 (PT);Other (comment)    Recommendations for Other Services       Precautions / Restrictions Precautions Precautions: Fall Restrictions Weight Bearing Restrictions: Yes RLE Weight Bearing: Weight bearing as tolerated LLE Weight Bearing: Weight bearing as tolerated    Mobility  Bed Mobility Overal bed mobility: Needs Assistance Bed Mobility: Sit to Supine     Supine to sit: Min assist        Transfers Overall transfer level: Needs assistance Equipment used: Rolling walker (2 wheeled) Transfers: Sit to/from Stand Sit to Stand: Mod assist;Max assist;+2 physical assistance         General transfer comment: Pt again needing elevated height, a lot of cuing/encouragement and considerable assist to get weight forward and up over BOS  Ambulation/Gait Ambulation/Gait assistance: Min assist;+2 physical assistance;+2 safety/equipment Gait Distance (Feet): 12 Feet Assistive device: Rolling walker (2  wheeled) Gait Pattern/deviations: Step-to pattern;Wide base of support Gait velocity: decreased   General Gait Details: short steps with forward lean on walker,  Verbal cues to stand fully and put walker more in front of him as he was leaning over bar.  +2 assist for safety with nurse tech bringing recliner.   Stairs             Wheelchair Mobility    Modified Rankin (Stroke Patients Only)       Balance Overall balance assessment: Needs assistance Sitting-balance support: Feet supported Sitting balance-Leahy Scale: Good     Standing balance support: Bilateral upper extremity supported Standing balance-Leahy Scale: Poor                              Cognition Arousal/Alertness: Awake/alert Behavior During Therapy: WFL for tasks assessed/performed Overall Cognitive Status: Within Functional Limits for tasks assessed                                        Exercises Total Joint Exercises Ankle Circles/Pumps: AROM;10 reps Quad Sets: Strengthening;15 reps Gluteal Sets: Strengthening;15 reps Heel Slides: AAROM;10 reps Straight Leg Raises: AAROM;Both;10 reps Knee Flexion: PROM;5 reps Goniometric ROM: R 4-85 L 3-88  extension measured in bed, flexion in chair.    General Comments        Pertinent Vitals/Pain Pain  Score: 8  Pain Location: Right and left knees Pain Descriptors / Indicators: Sharp;Sore;Tightness;Aching Pain Intervention(s): Limited activity within patient's tolerance;Monitored during session;Premedicated before session    Home Living                      Prior Function            PT Goals (current goals can now be found in the care plan section) Progress towards PT goals: Progressing toward goals    Frequency    BID      PT Plan Current plan remains appropriate    Co-evaluation              AM-PAC PT "6 Clicks" Mobility   Outcome Measure  Help needed turning from your back to your side  while in a flat bed without using bedrails?: A Little Help needed moving from lying on your back to sitting on the side of a flat bed without using bedrails?: A Lot Help needed moving to and from a bed to a chair (including a wheelchair)?: A Lot Help needed standing up from a chair using your arms (e.g., wheelchair or bedside chair)?: A Lot Help needed to walk in hospital room?: A Lot Help needed climbing 3-5 steps with a railing? : Total 6 Click Score: 12    End of Session Equipment Utilized During Treatment: Gait belt Activity Tolerance: Patient limited by pain;Patient limited by fatigue Patient left: in chair;with call bell/phone within reach Nurse Communication: Other (comment) Pain - Right/Left: Left Pain - part of body: Knee     Time: [id number] PT Time Calculation (min) (ACUTE ONLY): 28 min  Charges:  $Gait Training: 8-22 mins                     Danielle DessSarah Federica Allport, PTA 03/03/18, 8:55 AM

## 2018-03-04 ENCOUNTER — Inpatient Hospital Stay: Payer: Self-pay

## 2018-03-04 LAB — CBC
HCT: 26.3 % — ABNORMAL LOW (ref 39.0–52.0)
Hemoglobin: 8.7 g/dL — ABNORMAL LOW (ref 13.0–17.0)
MCH: 33.1 pg (ref 26.0–34.0)
MCHC: 33.1 g/dL (ref 30.0–36.0)
MCV: 100 fL (ref 80.0–100.0)
NRBC: 0.8 % — AB (ref 0.0–0.2)
Platelets: 213 10*3/uL (ref 150–400)
RBC: 2.63 MIL/uL — ABNORMAL LOW (ref 4.22–5.81)
RDW: 13.3 % (ref 11.5–15.5)
WBC: 7.8 10*3/uL (ref 4.0–10.5)

## 2018-03-04 LAB — PROTIME-INR
INR: 2.17
Prothrombin Time: 23.9 seconds — ABNORMAL HIGH (ref 11.4–15.2)

## 2018-03-04 LAB — OCCULT BLOOD X 1 CARD TO LAB, STOOL: Fecal Occult Bld: POSITIVE — AB

## 2018-03-04 MED ORDER — METOPROLOL TARTRATE 25 MG PO TABS
25.0000 mg | ORAL_TABLET | Freq: Two times a day (BID) | ORAL | Status: DC
  Administered 2018-03-04 – 2018-03-05 (×4): 25 mg via ORAL
  Filled 2018-03-04 (×4): qty 1

## 2018-03-04 MED ORDER — BISACODYL 10 MG RE SUPP: 10.0000 mg | Freq: Every day | RECTAL | Status: DC

## 2018-03-04 MED ORDER — METOPROLOL TARTRATE 25 MG PO TABS: 25.0000 mg | ORAL_TABLET | Freq: Two times a day (BID) | ORAL | 0 refills | Status: DC

## 2018-03-04 MED ORDER — POLYETHYLENE GLYCOL 3350 17 G PO PACK
17.0000 g | PACK | Freq: Every day | ORAL | Status: DC
  Administered 2018-03-04 – 2018-03-06 (×3): 17 g via ORAL
  Filled 2018-03-04 (×3): qty 1

## 2018-03-04 MED ORDER — POLYETHYLENE GLYCOL 3350 17 G PO PACK: 17.0000 g | PACK | Freq: Every day | ORAL | 0 refills | Status: DC

## 2018-03-04 MED ORDER — METOCLOPRAMIDE HCL 5 MG/ML IJ SOLN
10.0000 mg | Freq: Four times a day (QID) | INTRAMUSCULAR | Status: AC
  Administered 2018-03-04 – 2018-03-05 (×4): 10 mg via INTRAVENOUS
  Filled 2018-03-04 (×4): qty 2

## 2018-03-04 MED ORDER — BISACODYL 10 MG RE SUPP
10.0000 mg | Freq: Every day | RECTAL | Status: DC | PRN
  Administered 2018-03-04: 10 mg via RECTAL
  Filled 2018-03-04: qty 1

## 2018-03-04 MED ORDER — DOCUSATE SODIUM 100 MG PO CAPS: 100.0000 mg | ORAL_CAPSULE | Freq: Two times a day (BID) | ORAL | 0 refills | Status: DC

## 2018-03-04 NOTE — Progress Notes (Signed)
Noted positive stool for guiac. Hb had gradually dropped since admission. I will advise to stop warfarin for now, and I texted Dr. Marena ChancySchneier to decide need for anticoagulation . We need to decide need for warfarin before discharge.Pt already have IVC filter.

## 2018-03-04 NOTE — Progress Notes (Signed)
ANTICOAGULATION CONSULT NOTE - follow up Consult  Pharmacy Consult for warfarin therapy post TKR Indication: DVT w/o PE   Labs: Recent Labs    03/02/18 0436 03/02/18 0840 03/03/18 0308 03/04/18 0318  HGB  --  9.7* 9.4*  --   HCT  --  29.3* 28.9*  --   PLT  --  202 183  --   LABPROT 14.5  --  16.4* 23.9*  INR 1.14  --  1.34 2.17  CREATININE  --  1.16 0.91  --     Estimated Creatinine Clearance: 117.8 mL/min (by C-G formula based on SCr of 0.91 mg/dL).   Assessment: Patient presents for pain in the lower extremities w/ a h/o DVT and subsequent PE identified a few years ago and was placed on anticoagulation; now presents with thrombophlebitis and placement of IVC filter and bilateral total knee replacements. Orthopedic would like to restart patient's warfarin. Patient was apparently on warfarin PTA historically (was discontinued due to patient preference per med rec history)  Date INR Warfarin Dose  12/3 1.03   12/4 1.09 2.5 mg  12/5 1.05 2.5 mg  12/6 1.14 3 mg   12/7 1.34 5 mg  12/8 2.17    Recommend continuing 2.5 mg daily regimen at discharge. Daily INRs ordered. Pt also on enoxaparin (one day therapeutic). Hgb/Plt slightly trending down.   Goal of Therapy:  INR 2-3 Monitor platelets by anticoagulation protocol: Yes   Plan:  INR showing a large percentage increase overnight. Warfarin score 8 points.  Will hold dose for tonight. Follow up INR in AM.    Burnis Medinodney Markel Kurtenbach, PharmD Clinical Pharmacist 03/04/2018 10:53 AM

## 2018-03-04 NOTE — Progress Notes (Signed)
   Subjective: 5 Days Post-Op Procedure(s) (LRB): TOTAL KNEE BILATERAL (Bilateral) Patient reports pain as 9 on 0-10 scale.  Resolving vomiting.  Mild abdominal distention.  Passing gas.  Bowel movement yesterday.  KUB shows improving ileus.  NO CP/SOB. Patient is well, and has had no acute complaints or problems Denies any CP, SOB, ABD pain. We will continue with physical therapy today.    Objective: Vital signs in last 24 hours: Temp:  [97.8 F (36.6 C)-98.2 F (36.8 C)] 97.8 F (36.6 C) (12/07 2341) Pulse Rate:  [101-117] 101 (12/07 2341) Resp:  [18] 18 (12/07 2341) BP: (118-138)/(67-83) 138/83 (12/07 2341) SpO2:  [97 %-100 %] 97 % (12/07 2341)  Intake/Output from previous day: 12/07 0701 - 12/08 0700 In: 1284.9 [P.O.:660; I.V.:624.9] Out: 1500 [Urine:1500] Intake/Output this shift: Total I/O In: 480 [P.O.:480] Out: 800 [Urine:800]  Recent Labs    03/01/18 1252 03/02/18 0840 03/03/18 0308  HGB 10.5* 9.7* 9.4*   Recent Labs    03/02/18 0840 03/03/18 0308  WBC 11.9* 9.6  RBC 2.96* 2.89*  HCT 29.3* 28.9*  PLT 202 183   Recent Labs    03/02/18 0840 03/03/18 0308  NA 132* 137  K 3.8 3.8  CL 97* 101  CO2 25 25  BUN 30* 29*  CREATININE 1.16 0.91  GLUCOSE 138* 125*  CALCIUM 8.3* 8.4*   Recent Labs    03/03/18 0308 03/04/18 0318  INR 1.34 2.17    EXAM General - Patient is Alert, Appropriate and Oriented  ABD - slightly tender, mildy distended. BS present. Bilateral Extremities - Neurovascular intact Sensation intact distally Intact pulses distally Dorsiflexion/Plantar flexion intact No cellulitis present Compartment soft Dressing - dressing C/D/I and no drainage.  No active drainage. Motor Function - intact, moving left and right feet and toes well on exam.   Past Medical History:  Diagnosis Date  . Dyspnea   . Dyspnea on exertion   . Elevated lipids   . Hx of blood clots    legs  . Pulmonary emboli (HCC)   . Pulmonary embolism (HCC)  2011  . Pulmonary embolism (HCC)    2017    Assessment/Plan:   5 Days Post-Op Procedure(s) (LRB): TOTAL KNEE BILATERAL (Bilateral) Active Problems:   S/P TKR (total knee replacement) using cement, bilateral  Estimated body mass index is 41.67 kg/m as calculated from the following:   Height as of this encounter: 5\' 8"  (1.727 m).   Weight as of this encounter: 124.3 kg. Advance diet Up with therapy   On lovenox and coumadin for previous PE. IVC filter placed EKG ordered  DVT Prophylaxis - Lovenox, Coumadin, TED hose and SCDs Weight-Bearing as tolerated to left and right leg Plan for discharge home Monday.  Frank Rivera Frank Peil, PA-C [facility] 03/04/2018, 6:41 AM

## 2018-03-04 NOTE — Progress Notes (Signed)
MD Vachanni notified positive fecal occult. Orders received for CBC. Order placed

## 2018-03-04 NOTE — Progress Notes (Signed)
Physical Therapy Treatment Patient Details Name: Frank Rivera MRN: [id number] DOB: [birth year] Today's Date: 03/04/2018    History of Present Illness 57 yo male who s/p bilateral TKA 12/3. 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     PT Comments    Extensive time and education spent on purpose and technique of LE HEP ex's in bed (pt's wife assisting with R LE and therapist assisting with L LE): pt's wife appearing with good understanding.  Bed height elevated to simulate anticipated bed height at home and pt able to stand with CGA x2 with RW and then ambulate 35 feet with RW (limited distance d/t fatigue and B knee pain).  Pt's HR increased to 144 bpm with ambulation (nurse present and aware; O2 sats on room air and BP WFL).  Pain 8/10 B knees beginning of session and 9/10 end of session.  Will continue to progress pt with strengthening, knee ROM, and progressive functional mobility per pt tolerance.    Follow Up Recommendations  CIR     Equipment Recommendations  Rolling walker with 5" wheels;3in1 (PT)    Recommendations for Other Services OT consult     Precautions / Restrictions Precautions Precautions: Fall Precaution Booklet Issued: Yes (comment) Restrictions Weight Bearing Restrictions: Yes RLE Weight Bearing: Weight bearing as tolerated LLE Weight Bearing: Weight bearing as tolerated    Mobility  Bed Mobility Overal bed mobility: Needs Assistance Bed Mobility: Supine to Sit     Supine to sit: Supervision     General bed mobility comments: increased effort and time for pt to perform on own; bed flat; initial cues for technique  Transfers Overall transfer level: Needs assistance Equipment used: Rolling walker (2 wheeled) Transfers: Sit to/from Stand Sit to Stand: Min guard;+2 physical assistance;From elevated surface         General transfer comment: bed height elevated to approximate home bed height to simulate bed  transfer at home; increased effort and time for pt to perform; initial cueing for technique; pt pushing up with B UE's on walker  Ambulation/Gait Ambulation/Gait assistance: Min guard;+2 physical assistance;+2 safety/equipment(chair follow for safety) Gait Distance (Feet): 35 Feet Assistive device: Rolling walker (2 wheeled)   Gait velocity: decreased   General Gait Details: decreased stance time L LE compared to R LE; vc's to increase UE support through RW when advancing R LE; steady with RW; limited distance d/t fatigue; mild B knee flexion noted during ambulation   Stairs             Wheelchair Mobility    Modified Rankin (Stroke Patients Only)       Balance Overall balance assessment: Needs assistance Sitting-balance support: No upper extremity supported;Feet supported Sitting balance-Leahy Scale: Good Sitting balance - Comments: steady sitting reaching within BOS   Standing balance support: Bilateral upper extremity supported Standing balance-Leahy Scale: Poor Standing balance comment: pt requiring B UE support for static standing balance                            Cognition Arousal/Alertness: Awake/alert Behavior During Therapy: Anxious Overall Cognitive Status: Within Functional Limits for tasks assessed                                        Exercises Total Joint Exercises Ankle Circles/Pumps: AROM;Strengthening;Both;10 reps;Supine Quad Sets:  AROM;Strengthening;Both;10 reps;Supine Short Arc Quad: AROM;Right;AAROM;Left;Strengthening;10 reps;Supine Heel Slides: AAROM;Strengthening;Both;10 reps;Supine Hip ABduction/ADduction: AAROM;Strengthening;Both;10 reps;Supine Straight Leg Raises: AAROM;Strengthening;Both;10 reps;Supine Goniometric ROM: R knee extension 6 degrees short of neutral; L knee extension 8 degrees short of neutral; Therapist returned in afternoon to perform B knee flexion ROM ([id number]):  R knee flexion 92 degrees  and L knee flexion 82 degrees sitting in chair.    General Comments   Nursing cleared pt for participation in physical therapy.  Pt agreeable to PT session.  Pt's wife and son present during session.      Pertinent Vitals/Pain Pain Assessment: 0-10 Pain Score: 9  Pain Location: B knees Pain Descriptors / Indicators: Sore;Tender Pain Intervention(s): Limited activity within patient's tolerance;Monitored during session;Premedicated before session;Repositioned(Nursing staff present end of session applying polar care to B knees)    Home Living                      Prior Function            PT Goals (current goals can now be found in the care plan section) Acute Rehab PT Goals Patient Stated Goal: To return home PT Goal Formulation: With patient/family Time For Goal Achievement: 03/14/18 Potential to Achieve Goals: Fair Progress towards PT goals: Progressing toward goals    Frequency    BID      PT Plan Current plan remains appropriate    Co-evaluation              AM-PAC PT "6 Clicks" Mobility   Outcome Measure  Help needed turning from your back to your side while in a flat bed without using bedrails?: A Little Help needed moving from lying on your back to sitting on the side of a flat bed without using bedrails?: A Little Help needed moving to and from a bed to a chair (including a wheelchair)?: Total Help needed standing up from a chair using your arms (e.g., wheelchair or bedside chair)?: Total Help needed to walk in hospital room?: Total Help needed climbing 3-5 steps with a railing? : Total 6 Click Score: 10    End of Session Equipment Utilized During Treatment: Gait belt Activity Tolerance: Patient limited by fatigue;Patient limited by pain Patient left: in chair;with call bell/phone within reach;with nursing/sitter in room;with family/visitor present;Other (comment)(nursing staff present assisting pt with set-up (polar care, SCD's, etc)) Nurse  Communication: Mobility status;Precautions;Weight bearing status;Other (comment)(Pt's pain status) PT Visit Diagnosis: Unsteadiness on feet (R26.81);Difficulty in walking, not elsewhere classified (R26.2);Other abnormalities of gait and mobility (R26.89);Muscle weakness (generalized) (M62.81);Pain Pain - Right/Left: Left(Right) Pain - part of body: Knee     Time: 0932-1032 PT Time Calculation (min) (ACUTE ONLY): 60 min  Charges:  $Gait Training: 8-22 mins $Therapeutic Exercise: 23-37 mins $Therapeutic Activity: 8-22 mins                    Hendricks LimesEmily Irais Mottram, PT 03/04/18, 11:03 AM [phone]

## 2018-03-04 NOTE — Progress Notes (Signed)
[employer] [facility] [place] [facility]   PATIENT NAME: Frank Rivera    MR#:  [id number]  DATE OF BIRTH:  1960/07/22  SUBJECTIVE:  CHIEF COMPLAINT:  No chief complaint on file. Still have tachycardia.  Had 2-3 loose bowel movements yesterday and feeling less pain and no nausea today.   Tolerated liquid diet well and asking to upgrade the diet.  REVIEW OF SYSTEMS:  CONSTITUTIONAL: No fever, fatigue or weakness.  EYES: No blurred or double vision.  EARS, NOSE, AND THROAT: No tinnitus or ear pain.  RESPIRATORY: No cough, shortness of breath, wheezing or hemoptysis.  CARDIOVASCULAR: No chest pain, orthopnea, edema.  GASTROINTESTINAL: No nausea, vomiting, diarrhea or abdominal pain.  GENITOURINARY: No dysuria, hematuria.  ENDOCRINE: No polyuria, nocturia,  HEMATOLOGY: No anemia, easy bruising or bleeding SKIN: No rash or lesion. MUSCULOSKELETAL: No joint pain or arthritis.   NEUROLOGIC: No tingling, numbness, weakness.  PSYCHIATRY: No anxiety or depression.   ROS  DRUG ALLERGIES:   Allergies  Allergen Reactions  . Sulfa Antibiotics Other (See Comments)    As a child-told he had reaction to sulfa drugs.    VITALS:  Blood pressure (!) 142/68, pulse (!) 107, temperature 98.3 F (36.8 C), temperature source Oral, resp. rate 18, height 5\' 8"  (1.727 m), weight 124.3 kg, SpO2 100 %.  PHYSICAL EXAMINATION:   GENERAL:  57 y.o.-year-old patient lying in the bed with no acute distress.  EYES: Pupils equal, round, reactive to light and accommodation. No scleral icterus. Extraocular muscles intact.  HEENT: Head atraumatic, normocephalic. Oropharynx and nasopharynx clear.  Oral mucosa dry. NECK:  Supple, no jugular venous distention. No thyroid enlargement, no tenderness.  LUNGS: Normal breath sounds bilaterally, no wheezing, rales,rhonchi or crepitation. No use of accessory muscles of respiration.  CARDIOVASCULAR: S1, S2 fast and regular. No murmurs, rubs, or gallops.   ABDOMEN: Soft, nontender, nondistended. Bowel sounds  present. No organomegaly or mass.  EXTREMITIES: No pedal edema, cyanosis, or clubbing.  NEUROLOGIC: Cranial nerves II through XII are intact. Muscle strength 5/5 in all extremities. Sensation intact. Gait not checked.  Both legs in postsurgical cooling pumps. PSYCHIATRIC: The patient is alert and oriented x 3.  SKIN: No obvious rash, lesion, or ulcer.   Physical Exam LABORATORY PANEL:   CBC Recent Labs  Lab 03/03/18 0308  WBC 9.6  HGB 9.4*  HCT 28.9*  PLT 183   ------------------------------------------------------------------------------------------------------------------  Chemistries  Recent Labs  Lab 03/03/18 0308  NA 137  K 3.8  CL 101  CO2 25  GLUCOSE 125*  BUN 29*  CREATININE 0.91  CALCIUM 8.4*   ------------------------------------------------------------------------------------------------------------------  Cardiac Enzymes No results for input(s): TROPONINI in the last 168 hours. ------------------------------------------------------------------------------------------------------------------  RADIOLOGY:  Dg Chest 2 View  Result Date: 03/02/2018 CLINICAL DATA:  Tachycardia. EXAM: CHEST - 2 VIEW COMPARISON:  Radiographs of November 17, 2015. FINDINGS: The heart size and mediastinal contours are within normal limits. Both lungs are clear. No pneumothorax or pleural effusion is noted. The visualized skeletal structures are unremarkable. IMPRESSION: No active cardiopulmonary disease. Electronically Signed   By: Lupita Raider, M.D.   On: 03/02/2018 15:17   Dg Abd 1 View  Result Date: 03/04/2018 CLINICAL DATA:  Follow-up of ileus. Postop for knee replacement. Vomiting. Abdominal stent shin. EXAM: ABDOMEN - 1 VIEW COMPARISON:  03/03/2018 FINDINGS: Two supine views. IVC filter. Mild decrease in gaseous distension of large and small bowel. Distal gas identified. No pneumatosis or gross free intraperitoneal air. No  abnormal abdominal  calcifications. IMPRESSION: Slight improvement in gaseous distension of large and small bowel, favoring resolving adynamic ileus. Electronically Signed   By: Jeronimo GreavesKyle  Talbot M.D.   On: 03/04/2018 10:36   Dg Abd 1 View  Result Date: 03/03/2018 CLINICAL DATA:  57 year old male with abdominal pain, constipation and suspected postoperative ileus EXAM: ABDOMEN - 1 VIEW COMPARISON:  Prior abdominal radiographs 03/02/2017 FINDINGS: Persisting gaseous distension of the colon. Gas is also noted within loops of small bowel in the mid abdomen although the amount of gas within small bowel has decreased compared to the prior radiograph. A Bard Denali potentially retrievable IVC filter is noted within the expected location. Mild bilateral hip joint osteoarthritis. No acute osseous abnormality. IMPRESSION: 1. Decreasing gaseous distension of the small bowel but persistent gas throughout the colon. Findings suggest improving ileus. 2. Potentially retrievable IVC filter in place. Electronically Signed   By: Malachy MoanHeath  McCullough M.D.   On: 03/03/2018 09:26    ASSESSMENT AND PLAN:   Active Problems:   S/P TKR (total knee replacement) using cement, bilateral  *Tachycardia EKG reviewed, sinus tachycardia. Patient does not have any associated shortness of breath or chest pain. His blood pressure and oxygen saturation is currently stable. Clinically he appears dehydrated. We will continue IV fluid and monitor. Added IV pain medication for better control. We will add small dose of oral metoprolol for now. Increase the dose of metoprolol and advised to discharged with metoprolol 25 mg twice daily at home.  *Dehydration with acute renal insufficiency This likely secondary to ileus Keep n.p.o. and continue IV fluids. As ileus is improving now,  on liquid diet and decrease IV fluids. Improved now.  Stop IV fluid.  *Ileus Due to use of pain medications and postsurgical. Keep n.p.o. and give IV  fluids. Patient received some stool softeners and he had 2 loose bowel movements today, recheck x-ray KUB showed some improvement. Tolerated liquid diet and x-ray KUB shows further improvement so we will put on soft diet.. Also added scheduled stool softener because of anticipated use of pain medications.  *Hyponatremia IV fluids with help.  *Acute anemia-postsurgical. Patient had some coffee colored vomiting yesterday, but as per nurse this was mostly stool colored. I will check for stool guaiac.  Slight drop in hemoglobin could be postsurgical and IV fluids. We will continue to monitor. Added Protonix IV twice daily. Need to continue anticoagulation unless there is clear evidence of active bleeding, due to recent DVT and PE.  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 Currently advised to continue Lovenox,  INR is therapeutic now on warfarin. Discharg on warfarin dose advised as per pharmacy.  *Bilateral total knee replacement Management per primary team. Pain medications changed to IV morphine for better control. Patient was feeling drowsy and was having some confusion "loopy" with morphine and requesting to change back to oral as now we are starting diet.  As patient is improving and tolerating diet, INR is therapeutic.  I will sign off for now and looks like there is plan to discharge home tomorrow from orthopedic team. Please call for any further help needed.  All the records are reviewed and case discussed with Care Management/Social Workerr. Management plans discussed with the patient, family and they are in agreement.  CODE STATUS: Full code.  TOTAL TIME TAKING CARE OF THIS PATIENT: 35 minutes.  Discussed with wife in the room  POSSIBLE D/C IN 1-2 DAYS, DEPENDING ON CLINICAL CONDITION.   Altamese Dilling M.D  on 03/04/2018   Between 7am to 6pm - Pager - [phone]  After 6pm go to [email or web address] - password [email or web address]  [facility]  Office  [phone]  CC: Primary care physician; Dione Housekeeper, MD  Note: This dictation was prepared with Dragon dictation along with smaller phrase technology. Any transcriptional errors that result from this process are unintentional.

## 2018-03-05 LAB — CBC
HCT: 25.8 % — ABNORMAL LOW (ref 39.0–52.0)
Hemoglobin: 8.5 g/dL — ABNORMAL LOW (ref 13.0–17.0)
MCH: 33.1 pg (ref 26.0–34.0)
MCHC: 32.9 g/dL (ref 30.0–36.0)
MCV: 100.4 fL — ABNORMAL HIGH (ref 80.0–100.0)
NRBC: 0.8 % — AB (ref 0.0–0.2)
Platelets: 219 10*3/uL (ref 150–400)
RBC: 2.57 MIL/uL — ABNORMAL LOW (ref 4.22–5.81)
RDW: 13.4 % (ref 11.5–15.5)
WBC: 6.6 10*3/uL (ref 4.0–10.5)

## 2018-03-05 LAB — PROTIME-INR
INR: 3.09
Prothrombin Time: 31.4 seconds — ABNORMAL HIGH (ref 11.4–15.2)

## 2018-03-05 MED ORDER — OXYCODONE-ACETAMINOPHEN 5-325 MG PO TABS: 1.0000 | ORAL_TABLET | ORAL | 0 refills | Status: DC | PRN

## 2018-03-05 MED ORDER — METOPROLOL TARTRATE 25 MG PO TABS: 25.0000 mg | ORAL_TABLET | Freq: Two times a day (BID) | ORAL | 0 refills | Status: DC

## 2018-03-05 MED ORDER — DIPHENHYDRAMINE HCL 25 MG PO CAPS
25.0000 mg | ORAL_CAPSULE | Freq: Every evening | ORAL | Status: DC | PRN
  Administered 2018-03-05: 25 mg via ORAL
  Filled 2018-03-05: qty 1

## 2018-03-05 MED ORDER — DOCUSATE SODIUM 100 MG PO CAPS: 100.0000 mg | ORAL_CAPSULE | Freq: Two times a day (BID) | ORAL | 0 refills | Status: DC

## 2018-03-05 MED ORDER — METHOCARBAMOL 500 MG PO TABS: 500.0000 mg | ORAL_TABLET | Freq: Four times a day (QID) | ORAL | 0 refills | Status: AC | PRN

## 2018-03-05 MED ORDER — POLYETHYLENE GLYCOL 3350 17 G PO PACK: 17.0000 g | PACK | Freq: Every day | ORAL | 0 refills | Status: DC

## 2018-03-05 MED ORDER — FLEET ENEMA 7-19 GM/118ML RE ENEM
1.0000 | ENEMA | Freq: Once | RECTAL | Status: AC
  Administered 2018-03-05: 1 via RECTAL

## 2018-03-05 NOTE — Progress Notes (Addendum)
   Subjective: 6 Days Post-Op Procedure(s) (LRB): TOTAL KNEE BILATERAL (Bilateral) Patient reports pain as moderate.  Voided twice this am. + BM yesterday. Patient is well, and has had no acute complaints or problems Denies any CP, SOB, ABD pain. We will continue with physical therapy today.    Objective: Vital signs in last 24 hours: Temp:  [97.4 F (36.3 C)-98.3 F (36.8 C)] 98.1 F (36.7 C) (12/09 0723) Pulse Rate:  [93-99] 99 (12/09 0723) Resp:  [18-19] 18 (12/09 0723) BP: (121-152)/(86-90) 152/87 (12/09 0723) SpO2:  [98 %-100 %] 98 % (12/09 0723)  Intake/Output from previous day: 12/08 0701 - 12/09 0700 In: 620 [P.O.:620] Out: 801 [Urine:800; Stool:1] Intake/Output this shift: Total I/O In: -  Out: 300 [Urine:300]  Recent Labs    03/02/18 0840 03/03/18 0308 03/04/18 1639 03/05/18 0301  HGB 9.7* 9.4* 8.7* 8.5*   Recent Labs    03/04/18 1639 03/05/18 0301  WBC 7.8 6.6  RBC 2.63* 2.57*  HCT 26.3* 25.8*  PLT 213 219   Recent Labs    03/02/18 0840 03/03/18 0308  NA 132* 137  K 3.8 3.8  CL 97* 101  CO2 25 25  BUN 30* 29*  CREATININE 1.16 0.91  GLUCOSE 138* 125*  CALCIUM 8.3* 8.4*   Recent Labs    03/04/18 0318 03/05/18 0301  INR 2.17 3.09    EXAM General - Patient is Alert, Appropriate and Oriented  ABD - non tender, non distended. BS present. Bilateral Extremities - Neurovascular intact Sensation intact distally Intact pulses distally Dorsiflexion/Plantar flexion intact No cellulitis present Compartment soft Dressing - dressing C/D/I and no drainage.  No active drainage. Motor Function - intact, moving left and right feet and toes well on exam.   Past Medical History:  Diagnosis Date  . Dyspnea   . Dyspnea on exertion   . Elevated lipids   . Hx of blood clots    legs  . Pulmonary emboli (HCC)   . Pulmonary embolism (HCC) 2011  . Pulmonary embolism (HCC)    2017    Assessment/Plan:   6 Days Post-Op Procedure(s) (LRB): TOTAL  KNEE BILATERAL (Bilateral) Active Problems:   S/P TKR (total knee replacement) using cement, bilateral  Estimated body mass index is 41.67 kg/m as calculated from the following:   Height as of this encounter: 5\' 8"  (1.727 m).   Weight as of this encounter: 124.3 kg. Advance diet Up with therapy  Acute post op blood loss anemia - Hgb 8.5. Stable INR 3.09. Need to determine anticoagulation for discharge. Awaiting input from IM and vasclar. Abd pain improving. Tolerating po well with no N/V. +BM x 2 CM to assist with discharge   DVT Prophylaxis - TED hose, SCDs Weight-Bearing as tolerated to left and right leg Plan for discharge home Monday.  Evon Slackhomas C. Gaines, PA-C [facility] 03/05/2018, 8:19 AM

## 2018-03-05 NOTE — Progress Notes (Signed)
[employer] [facility] - [place] at [facility]   PATIENT NAME: Frank Rivera    MR#:  [id number]  DATE OF BIRTH:  Jan 03, 1961  SUBJECTIVE:  CHIEF COMPLAINT:  Intermittent episodes of tachycardia which could be from pain but denies any nausea vomiting Tolerating soft diet   REVIEW OF SYSTEMS:  CONSTITUTIONAL: No fever, fatigue or weakness.  EYES: No blurred or double vision.  EARS, NOSE, AND THROAT: No tinnitus or ear pain.  RESPIRATORY: No cough, shortness of breath, wheezing or hemoptysis.  CARDIOVASCULAR: No chest pain, orthopnea, edema.  GASTROINTESTINAL: No nausea, vomiting, diarrhea or abdominal pain.  GENITOURINARY: No dysuria, hematuria.  ENDOCRINE: No polyuria, nocturia,  HEMATOLOGY: No anemia, easy bruising or bleeding SKIN: No rash or lesion. MUSCULOSKELETAL: No joint pain or arthritis.   NEUROLOGIC: No tingling, numbness, weakness.  PSYCHIATRY: No anxiety or depression.   ROS  DRUG ALLERGIES:   Allergies  Allergen Reactions  . Sulfa Antibiotics Other (See Comments)    As a child-told he had reaction to sulfa drugs.    VITALS:  Blood pressure 139/70, pulse (!) 107, temperature 98.9 F (37.2 C), temperature source Oral, resp. rate 18, height 5\' 8"  (1.727 m), weight 124.3 kg, SpO2 100 %.  PHYSICAL EXAMINATION:   GENERAL:  57 y.o.-year-old patient lying in the bed with no acute distress.  EYES: Pupils equal, round, reactive to light and accommodation. No scleral icterus. Extraocular muscles intact.  HEENT: Head atraumatic, normocephalic. Oropharynx and nasopharynx clear.  Oral mucosa dry. NECK:  Supple, no jugular venous distention. No thyroid enlargement, no tenderness.  LUNGS: Normal breath sounds bilaterally, no wheezing, rales,rhonchi or crepitation. No use of accessory muscles of respiration.  CARDIOVASCULAR: S1, S2 fast and regular. No murmurs, rubs, or gallops.  ABDOMEN: Soft, nontender, nondistended. Bowel sounds  present. No organomegaly or mass.   EXTREMITIES: No pedal edema, cyanosis, or clubbing.  NEUROLOGIC: Cranial nerves II through XII are intact. Muscle strength 5/5 in all extremities. Sensation intact. Gait not checked.  Both legs in postsurgical cooling pumps. PSYCHIATRIC: The patient is alert and oriented x 3.  SKIN: No obvious rash, lesion, or ulcer.   Physical Exam LABORATORY PANEL:   CBC Recent Labs  Lab 03/05/18 0301  WBC 6.6  HGB 8.5*  HCT 25.8*  PLT 219   ------------------------------------------------------------------------------------------------------------------  Chemistries  Recent Labs  Lab 03/03/18 0308  NA 137  K 3.8  CL 101  CO2 25  GLUCOSE 125*  BUN 29*  CREATININE 0.91  CALCIUM 8.4*   ------------------------------------------------------------------------------------------------------------------  Cardiac Enzymes No results for input(s): TROPONINI in the last 168 hours. ------------------------------------------------------------------------------------------------------------------  RADIOLOGY:  Dg Abd 1 View  Result Date: 03/04/2018 CLINICAL DATA:  Follow-up of ileus. Postop for knee replacement. Vomiting. Abdominal stent shin. EXAM: ABDOMEN - 1 VIEW COMPARISON:  03/03/2018 FINDINGS: Two supine views. IVC filter. Mild decrease in gaseous distension of large and small bowel. Distal gas identified. No pneumatosis or gross free intraperitoneal air. No abnormal abdominal calcifications. IMPRESSION: Slight improvement in gaseous distension of large and small bowel, favoring resolving adynamic ileus. Electronically Signed   By: Jeronimo Greaves M.D.   On: 03/04/2018 10:36    ASSESSMENT AND PLAN:   Active Problems:   S/P TKR (total knee replacement) using cement, bilateral  *Tachycardia Clinically improved EKG reviewed, sinus tachycardia. Patient does not have any associated shortness of breath or chest pain. Status post IV fluids and needs good pain control  increase the dose of  metoprolol and advised to discharged with metoprolol 25  mg twice daily at home.  *Dehydration with acute renal insufficiency Significantly improved with IV fluids  *Ileus Due to use of pain medications and postsurgical. Clinically better tolerating soft diet  *Hyponatremia Resolved with IV fluids  *Acute anemia-postsurgical. Patient had some coffee colored vomiting 03/03/2018 no other episodes FOBT positive Discussed with Dr. 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 Discontinue Coumadin   *Bilateral total knee replacement Management per primary team. Pain medications changed to IV morphine for better control. Patient was feeling drowsy and was having some confusion "loopy" with morphine and requesting to change back to oral as now we are starting diet.  As patient is improving and tolerating diet, INR is therapeutic.  I will sign off for now and looks like there is plan to discharge home tomorrow from orthopedic team. Please call for any further help needed.  All the records are reviewed and case discussed with Care Management/Social Workerr. Management plans discussed with the patient, family and they are in agreement.  CODE STATUS: Full code.  TOTAL TIME TAKING CARE OF THIS PATIENT: 35 minutes.   Discussed with wife in the room  Discharge planning by orthopedics  Ramonita LabAruna Kendrick Remigio M.D on 03/05/2018   Between 7am to 6pm - Pager - [phone]  After 6pm go to [email or web address] - password [email or web address]  [facility]  Office  [phone]  CC: Primary care physician; Dione Housekeeperlmedo, Mario Ernesto, MD  Note: This dictation was prepared with Dragon dictation along with smaller phrase  technology. Any transcriptional errors that result from this process are unintentional.

## 2018-03-05 NOTE — Progress Notes (Signed)
Physical Therapy Treatment Patient Details Name: Frank MayerGary A Timme MRN: [id number] DOB: 11/17/1960 Today's Date: 03/05/2018    History of Present Illness 57 yo male who s/p bilateral TKA 12/3. 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     PT Comments    Pt requesting to toilet upon PT arrival.  [facility] and therapist returned once pt was done toileting (pt's wife present with pt for safety while sitting on commode; nurse notified).  Pt able to ambulate 37 feet with RW CGA x2 (limited distance d/t pt reporting feeling like his B knees were buckling; pt also appearing pale); BP 163/85 and HR 144 bpm (HR gradually decreased with rest); nurse notified of pt's vitals and reported they would be rechecking pt's vitals soon.  Pt assisted back to bed and pt's colored improved and pt reporting feeling better.  9/10 L>R knee pain during session but 7/10 end of session resting in bed.  Pt CGA x2 with all transfers during session using RW.  Pt's wife provided 2nd assist during session for continued family training.  Will continue to progress pt with increasing ambulation distance per pt tolerance.    Follow Up Recommendations  CIR     Equipment Recommendations  Rolling walker with 5" wheels;3in1 (PT)    Recommendations for Other Services OT consult     Precautions / Restrictions Precautions Precautions: Fall;Knee Precaution Booklet Issued: Yes (comment) Restrictions Weight Bearing Restrictions: Yes RLE Weight Bearing: Weight bearing as tolerated LLE Weight Bearing: Weight bearing as tolerated    Mobility  Bed Mobility Overal bed mobility: Needs Assistance Bed Mobility: Sit to Supine       Sit to supine: Supervision(from 27 inch bed height)   General bed mobility comments: increased effort and time for pt to perform on own; bed flat  Transfers Overall transfer level: Needs assistance Equipment used: Rolling walker (2  wheeled) Transfers: Sit to/from Stand Sit to Stand: Min guard;+2 physical assistance;From elevated surface Stand pivot transfers: Min guard;+2 physical assistance([facility]; recliner to elevated bed)       General transfer comment: increased effort and time to stand from recliner (x2) and BSC  Ambulation/Gait Ambulation/Gait assistance: Min guard;+2 physical assistance;+2 safety/equipment Gait Distance (Feet): 37 Feet Assistive device: Rolling walker (2 wheeled)   Gait velocity: decreased   General Gait Details: decreased stance time L LE compared to R LE; vc's to increase UE support through RW when advancing R LE; steady with RW; limited distance d/t fatigue; mild B knee flexion noted during ambulation   Stairs             Wheelchair Mobility    Modified Rankin (Stroke Patients Only)       Balance Overall balance assessment: Needs assistance Sitting-balance support: No upper extremity supported;Feet supported Sitting balance-Leahy Scale: Good Sitting balance - Comments: steady sitting reaching within BOS   Standing balance support: Bilateral upper extremity supported Standing balance-Leahy Scale: Poor Standing balance comment: pt requiring B UE support for static standing balance                            Cognition Arousal/Alertness: Awake/alert Behavior During Therapy: Anxious Overall Cognitive Status: Within Functional Limits for tasks assessed  Exercises Total Joint Exercises Long Arc Quad: AROM;Right;AAROM;Left;Strengthening;10 reps;Seated Knee Flexion: AROM;Strengthening;Both;10 reps;Seated Goniometric ROM (taken in AM session): R knee AAROM extension 3 degrees short of neutral and L knee AAROM extension 6 degrees short of neutral semi-supine in bed; B knee flexion AAROM to 95 degrees (1st attempt L knee 80 degrees but with cueing for technique able to get to 95 degrees on 2nd  attempt)    General Comments  Pt agreeable to PT session. Pt's wife present entire session.      Pertinent Vitals/Pain Pain Assessment: 0-10 Pain Score: 7  Pain Location: B knees Pain Intervention(s): Limited activity within patient's tolerance;Monitored during session;Premedicated before session;Repositioned;Ice applied;Other (comment)(RN notified)    Home Living                      Prior Function            PT Goals (current goals can now be found in the care plan section) Acute Rehab PT Goals Patient Stated Goal: To return home PT Goal Formulation: With patient/family Time For Goal Achievement: 03/14/18 Potential to Achieve Goals: Fair Progress towards PT goals: Progressing toward goals    Frequency    BID      PT Plan Current plan remains appropriate    Co-evaluation              AM-PAC PT "6 Clicks" Mobility   Outcome Measure  Help needed turning from your back to your side while in a flat bed without using bedrails?: A Little Help needed moving from lying on your back to sitting on the side of a flat bed without using bedrails?: A Little Help needed moving to and from a bed to a chair (including a wheelchair)?: A Little Help needed standing up from a chair using your arms (e.g., wheelchair or bedside chair)?: A Little Help needed to walk in hospital room?: A Little Help needed climbing 3-5 steps with a railing? : Total 6 Click Score: 16    End of Session Equipment Utilized During Treatment: Gait belt Activity Tolerance: Patient limited by fatigue;Patient limited by pain Patient left: in bed;with call bell/phone within reach;with bed alarm set;with family/visitor present;with SCD's reapplied;Other (comment)(B heels elevated via towel rolls positioned to limit B LE external rotation; B polar care in place and activated) Nurse Communication: Mobility status;Precautions;Weight bearing status PT Visit Diagnosis: Unsteadiness on feet  (R26.81);Difficulty in walking, not elsewhere classified (R26.2);Other abnormalities of gait and mobility (R26.89);Muscle weakness (generalized) (M62.81);Pain Pain - Right/Left: Left(Right) Pain - part of body: Knee     Time: [id number] PT Time Calculation (min) (ACUTE ONLY): 41 min  Charges:  $Gait Training: 8-22 mins $Therapeutic Exercise: 8-22 mins $Therapeutic Activity: 23-37 mins                    Hendricks Limes, PT 03/05/18, 4:16 PM [phone]

## 2018-03-05 NOTE — Care Management (Signed)
Patient ambulated to nurses station this AM; wife at his side watching PT. RNCM will continue to follow.

## 2018-03-05 NOTE — Progress Notes (Signed)
OT Cancellation Note  Patient Details Name: Frank Rivera MRN: [id number] DOB: 04/09/1960   Cancelled Treatment:    Reason Eval/Treat Not Completed: Other (comment). Pt working with PT upon attempt to treat. Will re-attempt at later date/time as pt is available and medically appropriate.   Richrd PrimeJamie Stiller, MPH, MS, OTR/L ascom [phone] 03/05/18, 2:54 PM

## 2018-03-05 NOTE — Plan of Care (Signed)

## 2018-03-05 NOTE — Progress Notes (Signed)
Inpatient Rehabilitation-Admissions Coordinator   Followed up with pt this morning via phone to discuss post acute rehab decision. Noted improvement in mobility and function over the weekend. Pt states he now plans to go straight home from the hospital.  Pt also stated cost of CIR as a factor in his final decision. AC will communicate with CM and sign off.   Please call if questions.   Nanine MeansKelly Orvell Careaga, OTR/L  Rehab Admissions Coordinator  [phone] 03/05/2018 11:44 AM

## 2018-03-05 NOTE — Discharge Summary (Signed)
Physician Discharge Summary  Patient ID: Frank Rivera MRN: [id number] DOB/AGE: 1960/04/24 57 y.o.  Admit date: 02/27/2018 Discharge date: 03/06/2018 Admission Diagnoses:  BILATERAL PRIMARY OSTEOARTHRITIS OF KNEE   Discharge Diagnoses: Patient Active Problem List   Diagnosis Date Noted  . S/P TKR (total knee replacement) using cement, bilateral 02/27/2018  . Pain in limb 12/16/2016  . Superficial thrombophlebitis of right leg 09/20/2016  . 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 Pulmonary embolism, bilateral (HCC) 01/04/2016  . Primary hypercoagulable state (HCC) 01/04/2016  . Acute pulmonary embolism (HCC) 11/17/2015    Past Medical History:  Diagnosis Date  . Dyspnea   . Dyspnea on exertion   . Elevated lipids   . Hx of blood clots    legs  . Pulmonary emboli (HCC)   . Pulmonary embolism (HCC) 2011  . Pulmonary embolism (HCC)    2017     Transfusion: NONE   Consultants (if any): Treatment Team:  Schnier, Latina Craver, MD Wyn Quaker Marlow Baars, MD  Discharged Condition: Improved  Hospital Course: Frank Rivera is an 57 y.o. male who was admitted 02/27/2018 with a diagnosis of bilateral knee osteoarthritis and went to the operating room on 02/27/2018 and underwent the above named procedures.    Surgeries: Procedure(s): TOTAL KNEE BILATERAL on 02/27/2018 Patient tolerated the surgery well. Taken to PACU where she was stabilized and then transferred to the orthopedic floor.  Started on Lovenox Coumadin, teds and SCDs. Heels elevated on bed with rolled towels. No evidence of DVT. Negative Homan. Physical therapy started on day #1 for gait training and transfer. OT started day #1 for ADL and assisted devices.  Patient's foley was d/c on day #1. Patient's IV was d/c on day #2.  On postop day 2, patient noted to have increased abdominal pain with couple episodes of vomiting.  Patient also tachycardic with no chest pain or shortness of breath.  KUB obtained showing  postoperative ileus, patient placed n.p.o.  EKG obtained showing sinus tach.  Medicine consult was obtained.  Tachycardia thought to be due to dehydration, IV fluids were administered and stronger pain medicines were given to help with pain control.  Patient also placed on metoprolol.  In regards to ileus patient was kept n.p.o. IV fluids were administered and we continue with stool softeners.  Patient did have having several small bowel movements and on postop day 6, patient did have large bowel movement.  Patient's abdominal pain nausea and vomiting did resolve.  He did have a follow-up x-ray showing resolution of ileus.  Patient did have a small drop in hemoglobin and was positive for fecal Hemoccult test.  Patient was placed on Protonix.  Coumadin was discontinued after discussions with vascular who had placed patient's IVC filter prior to TKA.  Patient will be following up with gastroenterologist and then we will determine when patient can restart Coumadin.  On postop day 7 patient was making good progress with physical therapy and was stable and ready for discharge to home with home health PT.   Implants: Medacta GMK sphere system, right knee: 4+  right femur, 4 tibia with tibial stem.  3 patella, 10 mm flex insert.  Left knee: 4 + left femur, 4 left tibia with short stem and 10 mm insert with 3 patella, all components cement  He was given perioperative antibiotics:  Anti-infectives (From admission, onward)   Start     Dose/Rate Route Frequency Ordered Stop   02/27/18 2200  ceFAZolin (ANCEF) IVPB 2g/100 mL  premix     2 g 200 mL/hr over 30 Minutes Intravenous Every 6 hours 02/27/18 2117 02/28/18 1014   02/27/18 1825  gentamicin (GARAMYCIN) injection  Status:  Discontinued       As needed 02/27/18 1826 02/27/18 1941   02/26/18 2230  ceFAZolin (ANCEF) 3 g in dextrose 5 % 50 mL IVPB     3 g 100 mL/hr over 30 Minutes Intravenous  Once 02/26/18 2222 02/27/18 1632    .  He was given sequential  compression devices, early ambulation, and TED hose for DVT prophylaxis  He benefited maximally from the hospital stay and there were no complications.    Recent vital signs:  Vitals:   03/05/18 0723 03/05/18 1542  BP: (!) 152/87 139/70  Pulse: 99 (!) 107  Resp: 18 18  Temp: 98.1 F (36.7 C) 98.9 F (37.2 C)  SpO2: 98% 100%    Recent laboratory studies:  Lab Results  Component Value Date   HGB 8.5 (L) 03/05/2018   HGB 8.7 (L) 03/04/2018   HGB 9.4 (L) 03/03/2018   Lab Results  Component Value Date   WBC 6.6 03/05/2018   PLT 219 03/05/2018   Lab Results  Component Value Date   INR 3.09 03/05/2018   Lab Results  Component Value Date   NA 137 03/03/2018   K 3.8 03/03/2018   CL 101 03/03/2018   CO2 25 03/03/2018   BUN 29 (H) 03/03/2018   CREATININE 0.91 03/03/2018   GLUCOSE 125 (H) 03/03/2018    Discharge Medications:   Allergies as of 03/05/2018      Reactions   Sulfa Antibiotics Other (See Comments)   As a child-told he had reaction to sulfa drugs.      Medication List    STOP taking these medications   nabumetone 750 MG tablet Commonly known as:  RELAFEN     TAKE these medications   albuterol 108 (90 Base) MCG/ACT inhaler Commonly known as:  PROVENTIL HFA;VENTOLIN HFA Inhale 1-2 puffs into the lungs every 6 (six) hours as needed for wheezing or shortness of breath.   diphenhydramine-acetaminophen 25-500 MG Tabs tablet Commonly known as:  TYLENOL PM Take 2 tablets by mouth at bedtime as needed (for sleep).   docusate sodium 100 MG capsule Commonly known as:  COLACE Take 1 capsule (100 mg total) by mouth 2 (two) times daily.   methocarbamol 500 MG tablet Commonly known as:  ROBAXIN Take 1 tablet (500 mg total) by mouth every 6 (six) hours as needed for muscle spasms.   metoprolol tartrate 25 MG tablet Commonly known as:  LOPRESSOR Take 1 tablet (25 mg total) by mouth 2 (two) times daily.   oxyCODONE-acetaminophen 5-325 MG tablet Commonly known  as:  PERCOCET/ROXICET Take 1-2 tablets by mouth every 4 (four) hours as needed for moderate pain or severe pain.   polyethylene glycol packet Commonly known as:  MIRALAX / GLYCOLAX Take 17 g by mouth daily.   simvastatin 40 MG tablet Commonly known as:  ZOCOR Take 60 mg by mouth at bedtime.            Durable Medical Equipment  (From admission, onward)         Start     Ordered   02/27/18 2118  DME Walker rolling  Once    Question:  Patient needs a walker to treat with the following condition  Answer:  S/P TKR (total knee replacement) using cement, bilateral   02/27/18 2117   02/27/18  2118  DME 3 n 1  Once     02/27/18 2117   02/27/18 2118  DME Bedside commode  Once    Question:  Patient needs a bedside commode to treat with the following condition  Answer:  S/P TKR (total knee replacement) using cement, bilateral   02/27/18 2117          Diagnostic Studies: Dg Chest 2 View  Result Date: 03/02/2018 CLINICAL DATA:  Tachycardia. EXAM: CHEST - 2 VIEW COMPARISON:  Radiographs of November 17, 2015. FINDINGS: The heart size and mediastinal contours are within normal limits. Both lungs are clear. No pneumothorax or pleural effusion is noted. The visualized skeletal structures are unremarkable. IMPRESSION: No active cardiopulmonary disease. Electronically Signed   By: Lupita Raider, M.D.   On: 03/02/2018 15:17   Dg Knee 1-2 Views Left  Result Date: 02/27/2018 CLINICAL DATA:  Postop pain EXAM: LEFT KNEE - 1-2 VIEW COMPARISON:  None. FINDINGS: Changes of left knee replacement. No hardware bony complicating feature. Soft tissue and joint space gas noted. IMPRESSION: Left knee replacement.  No visible complicating feature. Electronically Signed   By: Charlett Nose M.D.   On: 02/27/2018 20:30   Dg Knee 1-2 Views Right  Result Date: 02/27/2018 CLINICAL DATA:  Knee replacement EXAM: RIGHT KNEE - 1-2 VIEW COMPARISON:  None. FINDINGS: Changes of right knee replacement. No hardware or bony  complicating feature. Soft tissue gas noted. IMPRESSION: Right knee replacement.  No complicating feature. Electronically Signed   By: Charlett Nose M.D.   On: 02/27/2018 20:30   Dg Abd 1 View  Result Date: 03/04/2018 CLINICAL DATA:  Follow-up of ileus. Postop for knee replacement. Vomiting. Abdominal stent shin. EXAM: ABDOMEN - 1 VIEW COMPARISON:  03/03/2018 FINDINGS: Two supine views. IVC filter. Mild decrease in gaseous distension of large and small bowel. Distal gas identified. No pneumatosis or gross free intraperitoneal air. No abnormal abdominal calcifications. IMPRESSION: Slight improvement in gaseous distension of large and small bowel, favoring resolving adynamic ileus. Electronically Signed   By: Jeronimo Greaves M.D.   On: 03/04/2018 10:36   Dg Abd 1 View  Result Date: 03/03/2018 CLINICAL DATA:  57 year old male with abdominal pain, constipation and suspected postoperative ileus EXAM: ABDOMEN - 1 VIEW COMPARISON:  Prior abdominal radiographs 03/02/2017 FINDINGS: Persisting gaseous distension of the colon. Gas is also noted within loops of small bowel in the mid abdomen although the amount of gas within small bowel has decreased compared to the prior radiograph. A Bard Denali potentially retrievable IVC filter is noted within the expected location. Mild bilateral hip joint osteoarthritis. No acute osseous abnormality. IMPRESSION: 1. Decreasing gaseous distension of the small bowel but persistent gas throughout the colon. Findings suggest improving ileus. 2. Potentially retrievable IVC filter in place. Electronically Signed   By: Malachy Moan M.D.   On: 03/03/2018 09:26   Dg Abd 1 View  Result Date: 03/02/2018 CLINICAL DATA:  Abdominal pain for 2 days following previous knee surgery EXAM: ABDOMEN - 1 VIEW COMPARISON:  None. FINDINGS: Diffuse small and large bowel gas is noted. Some mild prominence of the small bowel is seen likely representing a postoperative ileus. No free air is seen. IVC  filter is noted in satisfactory position. Degenerative changes of the lumbar spine are noted. IMPRESSION: Scattered large and small bowel gas likely related to a postoperative ileus. Correlation with the physical exam is recommended. Electronically Signed   By: Alcide Clever M.D.   On: 03/02/2018 10:17  Disposition:     Follow-up Information    Evon Slack, PA-C Follow up in 2 week(s).   Specialties:  Orthopedic Surgery, Emergency Medicine Contact information: [address] [place] [address] [phone]            Signed: Patience Musca 03/05/2018, 4:46 PM

## 2018-03-05 NOTE — Progress Notes (Signed)
Physical Therapy Treatment Patient Details Name: Frank Rivera MRN: [id number] DOB: [birth year] Today's Date: 03/05/2018    History of Present Illness 57 yo male who s/p bilateral TKA 12/3. 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     PT Comments    Performed family training with pt's wife this morning.  Pt able to get OOB supine to sit with increased time and effort on own.  Able to stand from elevated bed and from recliner chair with CGA x2 for safety (bed height elevated to 27 inches and recliner at 19 inches to simulate home set-up).  Pt then able to progress to ambulating 42 feet with RW CGA x2 with close chair follow for safety; limited distance ambulating d/t L>R knee pain and fatigue (HR increased to 122 bpm with activity).  Pt's wife performing 2nd assist for pt during session and appearing to cue and assist pt appropriately.  Pt able to progress to B knee flexion of 95 degrees today; L knee extension ROM 6 degrees short of neutral vs R knee 3 degrees short of neutral.  L LE/knee appearing weaker and more painful than R LE/knee.  Will continue to progress pt with strengthening, ROM, and progressive functional mobility per pt tolerance.   Follow Up Recommendations  CIR     Equipment Recommendations  Rolling walker with 5" wheels;3in1 (PT)    Recommendations for Other Services OT consult     Precautions / Restrictions Precautions Precautions: Fall Precaution Booklet Issued: Yes (comment) Restrictions Weight Bearing Restrictions: Yes RLE Weight Bearing: Weight bearing as tolerated LLE Weight Bearing: Weight bearing as tolerated    Mobility  Bed Mobility Overal bed mobility: Needs Assistance Bed Mobility: Supine to Sit     Supine to sit: Supervision     General bed mobility comments: increased effort and time for pt to perform on own; bed flat  Transfers Overall transfer level: Needs assistance Equipment used: Rolling  walker (2 wheeled) Transfers: Sit to/from Stand Sit to Stand: Min guard;+2 physical assistance;From elevated surface Stand pivot transfers: Min guard;+2 physical assistance;From elevated surface       General transfer comment: Pt stood from bed height of 27 inches and then from recliner height of approximately 19 inches; pt able to verbalize appropriate technique out loud  Ambulation/Gait Ambulation/Gait assistance: Min guard;+2 physical assistance;+2 safety/equipment(chair follow for safety) Gait Distance (Feet): 42 Feet Assistive device: Rolling walker (2 wheeled)   Gait velocity: decreased   General Gait Details: decreased stance time L LE compared to R LE; vc's to increase UE support through RW when advancing R LE; steady with RW; limited distance d/t fatigue; mild B knee flexion noted during ambulation   Stairs             Wheelchair Mobility    Modified Rankin (Stroke Patients Only)       Balance Overall balance assessment: Needs assistance Sitting-balance support: No upper extremity supported;Feet supported Sitting balance-Leahy Scale: Good Sitting balance - Comments: steady sitting reaching within BOS   Standing balance support: Bilateral upper extremity supported Standing balance-Leahy Scale: Poor Standing balance comment: pt requiring B UE support for static standing balance                            Cognition Arousal/Alertness: Awake/alert Behavior During Therapy: Anxious Overall Cognitive Status: Within Functional Limits for tasks assessed  Exercises Total Joint Exercises Heel Slides: AAROM;Strengthening;Both;10 reps;Supine Long Arc Quad: AROM;Right;AAROM;Left;Strengthening;10 reps;Seated Knee Flexion: AROM;Strengthening;Both;10 reps;Seated Goniometric ROM: R knee AAROM extension 3 degrees short of neutral and L knee AAROM extension 6 degrees short of neutral semi-supine in bed; B  knee flexion AAROM to 95 degrees (1st attempt L knee 80 degrees but with cueing for technique able to get to 95 degrees on 2nd attempt)    General Comments   Nursing cleared pt for participation in physical therapy.  Pt agreeable to PT session.  Pt's wife present for entire PT session.      Pertinent Vitals/Pain Pain Assessment: 0-10 Pain Score: 9  Pain Location: B knees Pain Descriptors / Indicators: Sore;Tender Pain Intervention(s): Limited activity within patient's tolerance;Monitored during session;Premedicated before session;Repositioned;Ice applied    Home Living                      Prior Function            PT Goals (current goals can now be found in the care plan section) Acute Rehab PT Goals Patient Stated Goal: To return home PT Goal Formulation: With patient/family Time For Goal Achievement: 03/14/18 Potential to Achieve Goals: Fair Progress towards PT goals: Progressing toward goals    Frequency    BID      PT Plan Current plan remains appropriate    Co-evaluation              AM-PAC PT "6 Clicks" Mobility   Outcome Measure  Help needed turning from your back to your side while in a flat bed without using bedrails?: A Little Help needed moving from lying on your back to sitting on the side of a flat bed without using bedrails?: A Little Help needed moving to and from a bed to a chair (including a wheelchair)?: A Little Help needed standing up from a chair using your arms (e.g., wheelchair or bedside chair)?: A Little Help needed to walk in hospital room?: A Little Help needed climbing 3-5 steps with a railing? : Total 6 Click Score: 16    End of Session Equipment Utilized During Treatment: Gait belt Activity Tolerance: Patient limited by fatigue;Patient limited by pain Patient left: in chair;with call bell/phone within reach;with chair alarm set;with family/visitor present;with SCD's reapplied(pt's wife requesting to set up polar care  and towel rolls (reports she has been doing this for practice for home)) Nurse Communication: Mobility status;Precautions;Weight bearing status PT Visit Diagnosis: Unsteadiness on feet (R26.81);Difficulty in walking, not elsewhere classified (R26.2);Other abnormalities of gait and mobility (R26.89);Muscle weakness (generalized) (M62.81);Pain Pain - Right/Left: Left(Right) Pain - part of body: Knee     Time: [id number] PT Time Calculation (min) (ACUTE ONLY): 58 min  Charges:  $Gait Training: 8-22 mins $Therapeutic Exercise: 8-22 mins $Therapeutic Activity: 23-37 mins                    Hendricks LimesEmily Declynn Lopresti, PT 03/05/18, 12:22 PM [phone]

## 2018-03-05 NOTE — Discharge Instructions (Signed)

## 2018-03-05 NOTE — Care Management (Signed)
Call received from DeweeseKelly with CIR stating that patient has declined CIR for rehab and anticipates returning to home.

## 2018-03-06 LAB — CREATININE, SERUM
Creatinine, Ser: 0.63 mg/dL (ref 0.61–1.24)
GFR calc Af Amer: >60 mL/min (ref >60)
GFR calc non Af Amer: >60 mL/min (ref >60)

## 2018-03-06 LAB — PROTIME-INR
INR: 2.54
Prothrombin Time: 27 seconds — ABNORMAL HIGH (ref 11.4–15.2)

## 2018-03-06 NOTE — Progress Notes (Signed)
[facility] [place] [facility]   PATIENT NAME: Frank Rivera    MR#:  [id number]  DATE OF BIRTH:  03/03/1961  SUBJECTIVE:  CHIEF COMPLAINT:  Intermittent episodes of tachycardia which could be from pain but denies any nausea vomiting.  Had a bowel movement last night Tolerating soft diet.  Patient is aware that his Coumadin is discontinued   REVIEW OF SYSTEMS:  CONSTITUTIONAL: No fever, fatigue or weakness.  EYES: No blurred or double vision.  EARS, NOSE, AND THROAT: No tinnitus or ear pain.  RESPIRATORY: No cough, shortness of breath, wheezing or hemoptysis.  CARDIOVASCULAR: No chest pain, orthopnea, edema.  GASTROINTESTINAL: No nausea, vomiting, diarrhea or abdominal pain.  GENITOURINARY: No dysuria, hematuria.  ENDOCRINE: No polyuria, nocturia,  HEMATOLOGY: No anemia, easy bruising or bleeding SKIN: No rash or lesion. MUSCULOSKELETAL: No joint pain or arthritis.   NEUROLOGIC: No tingling, numbness, weakness.  PSYCHIATRY: No anxiety or depression.   ROS  DRUG ALLERGIES:   Allergies  Allergen Reactions  . Sulfa Antibiotics Other (See Comments)    As a child-told he had reaction to sulfa drugs.    VITALS:  Blood pressure 131/72, pulse 98, temperature 98.2 F (36.8 C), temperature source Oral, resp. rate 18, height 5\' 8"  (1.727 m), weight 124.3 kg, SpO2 96 %.  PHYSICAL EXAMINATION:   GENERAL:  57 y.o.-year-old patient lying in the bed with no acute distress.  EYES: Pupils equal, round, reactive to light and accommodation. No scleral icterus. Extraocular muscles intact.  HEENT: Head atraumatic, normocephalic. Oropharynx and nasopharynx clear.  Oral mucosa dry. NECK:  Supple, no jugular venous distention. No thyroid enlargement, no tenderness.  LUNGS: Normal breath sounds bilaterally, no wheezing, rales,rhonchi or crepitation. No use of accessory muscles of respiration.  CARDIOVASCULAR: S1, S2 fast and regular. No murmurs, rubs, or gallops.  ABDOMEN:  Soft, nontender, nondistended. Bowel sounds  present. No organomegaly or mass.  EXTREMITIES: No pedal edema, cyanosis, or clubbing.  NEUROLOGIC: Cranial nerves II through XII are intact. Muscle strength 5/5 in all extremities. Sensation intact. Gait not checked.  Both legs in postsurgical cooling pumps. PSYCHIATRIC: The patient is alert and oriented x 3.  SKIN: No obvious rash, lesion, or ulcer.   Physical Exam LABORATORY PANEL:   CBC Recent Labs  Lab 03/05/18 0301  WBC 6.6  HGB 8.5*  HCT 25.8*  PLT 219   ------------------------------------------------------------------------------------------------------------------  Chemistries  Recent Labs  Lab 03/03/18 0308 03/06/18 0451  NA 137  --   K 3.8  --   CL 101  --   CO2 25  --   GLUCOSE 125*  --   BUN 29*  --   CREATININE 0.91 0.63  CALCIUM 8.4*  --    ------------------------------------------------------------------------------------------------------------------  Cardiac Enzymes No results for input(s): TROPONINI in the last 168 hours. ------------------------------------------------------------------------------------------------------------------  RADIOLOGY:  No results found.  ASSESSMENT AND PLAN:   Active Problems:   S/P TKR (total knee replacement) using cement, bilateral  *Tachycardia Clinically improved EKG reviewed, sinus tachycardia. Patient does not have any associated shortness of breath or chest pain. Status post IV fluids and needs good pain control  increase the dose of metoprolol and advised to discharged with metoprolol 25 mg twice daily at home.  *Dehydration with acute renal insufficiency Significantly improved with IV fluids  *Ileus Due to use of pain medications and postsurgical. Clinically better tolerating soft diet.  Had a bowel movement last night  *Hyponatremia Resolved with IV fluids  *Acute anemia-postsurgical. Patient had some  coffee colored vomiting 03/03/2018 no other  episodes FOBT positive Discussed with Dr. 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 Discontinue Coumadin   *Bilateral total knee replacement Management per primary team. Pain medications changed to IV morphine for better control. Patient was feeling drowsy and was having some confusion "loopy" with morphine and requesting to change back to oral as now we are starting diet.  As patient is improving and tolerating diet, INR is therapeutic.  I will sign off for now and looks like there is plan to discharge home today from orthopedic team. Please call for any further help needed.  All the records are reviewed and case discussed with Care Management/Social Workerr. Management plans discussed with the patient, family and they are in agreement.  CODE STATUS: Full code.  TOTAL TIME TAKING CARE OF THIS PATIENT: 35 minutes.   Discussed with wife in the room  Discharge planning by orthopedics  Ramonita Lab M.D on 03/06/2018   Between 7am to 6pm - Pager - [phone]  After 6pm go to [email or web address] - password [email or web address]  [facility]  Office  [phone]  CC: Primary care physician; Dione Housekeeper, MD  Note: This dictation was prepared with Dragon dictation along with smaller phrase technology. Any transcriptional errors that result from this process are unintentional.

## 2018-03-06 NOTE — Progress Notes (Signed)
   Subjective: 7 Days Post-Op Procedure(s) (LRB): TOTAL KNEE BILATERAL (Bilateral) Patient reports pain as mild.  Voided twice this am. large BM yesterday. Patient is well, and has had no acute complaints or problems Denies any CP, SOB, ABD pain. No N/V We will continue with physical therapy today.    Objective: Vital signs in last 24 hours: Temp:  [98.2 F (36.8 C)-98.9 F (37.2 C)] 98.2 F (36.8 C) (12/10 0745) Pulse Rate:  [92-107] 98 (12/10 0745) Resp:  [18-19] 18 (12/10 0745) BP: (131-144)/(70-80) 131/72 (12/10 0745) SpO2:  [96 %-100 %] 96 % (12/10 0745)  Intake/Output from previous day: 12/09 0701 - 12/10 0700 In: -  Out: 550 [Urine:550] Intake/Output this shift: Total I/O In: -  Out: 200 [Urine:200]  Recent Labs    03/04/18 1639 03/05/18 0301  HGB 8.7* 8.5*   Recent Labs    03/04/18 1639 03/05/18 0301  WBC 7.8 6.6  RBC 2.63* 2.57*  HCT 26.3* 25.8*  PLT 213 219   Recent Labs    03/06/18 0451  CREATININE 0.63   Recent Labs    03/05/18 0301 03/06/18 0451  INR 3.09 2.54    EXAM General - Patient is Alert, Appropriate and Oriented  ABD - non tender, non distended. BS present. Bilateral Extremities - Neurovascular intact Sensation intact distally Intact pulses distally Dorsiflexion/Plantar flexion intact No cellulitis present Compartment soft Dressing - dressing C/D/I and no drainage.  No active drainage. Motor Function - intact, moving left and right feet and toes well on exam.   Past Medical History:  Diagnosis Date  . Dyspnea   . Dyspnea on exertion   . Elevated lipids   . Hx of blood clots    legs  . Pulmonary emboli (HCC)   . Pulmonary embolism (HCC) 2011  . Pulmonary embolism (HCC)    2017    Assessment/Plan:   7 Days Post-Op Procedure(s) (LRB): TOTAL KNEE BILATERAL (Bilateral) Active Problems:   S/P TKR (total knee replacement) using cement, bilateral  Estimated body mass index is 41.67 kg/m as calculated from the  following:   Height as of this encounter: 5\' 8"  (1.727 m).   Weight as of this encounter: 124.3 kg. Advance diet Up with therapy  Pain and vital signs stable Bowels moving well. Tolerating po. Plan on discharge to home with HHPT today Follow up with GI outpatient. Follow up with [facility] in 2 weeks   DVT Prophylaxis - TED hose, SCDs Weight-Bearing as tolerated to left and right leg Plan for discharge home Monday.  Evon Slackhomas C. Chenell Lozon, PA-C [facility] 03/06/2018, 9:44 AM

## 2018-03-06 NOTE — Progress Notes (Signed)
Discharge summary reviewed with patient and spouse. Spouse already had RXs. Verbal understanding. Escorted to personal vehicle via wc

## 2018-03-06 NOTE — Care Management (Signed)
Rosey Batheresa with [facility] notified of patient discharge today.  No other RNCM needs.

## 2018-03-06 NOTE — Progress Notes (Signed)
Physical Therapy Treatment Patient Details Name: Frank Rivera MRN: [id number] DOB: 1960-05-30 Today's Date: 03/06/2018    History of Present Illness 57 yo male who s/p bilateral TKA 12/3. 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     PT Comments    Pt SBA supine to sit; CGA x2 to stand with RW (simulating height of surfaces at home); and CGA x2 to ambulate 100 feet with RW.  D/t significant increase in distance (compared to prior sessions), PT limited ambulation distance d/t concerns of overdoing it prior to discharge home today.  Pt's wife and pt report plan to "bump" pt up/down steps in w/c to enter home via stairs (therapist discussed technique and safety with this method and both pt and pt's wife verbalizing understanding and also reporting they would have at least 2 person assist to perform this).  Reviewed pt's LE bed level ex's and sitting ex's; pt's wife assisted with L LE ex's (and therapist assisted with R LE ex's) with bed level ex's and pt and pt's wife verbalizing and demonstrating appropriate understanding.  Pt requiring education on importance on B knee extension and flexion ROM and to focus on this to improve functional mobility (also discussed with pt's wife): pt and pt's wife verbalizing appropriate understanding and pt demonstrating appropriate understanding with exercise technique.  Simulated set-up for pt's car transfer but pt unable to stand simulating B hand placement options (too low of a surface for pt to stand from with B hand placement options).  Extensive time spent problem solving (discussed ambulance transport home but pt and pt's wife declining) and pt and pt's wife able to obtain Zenaida Niece (that appeared not too tall and not too short) that improved ability to get in/out of vehicle safely.  Discussed Zenaida Niece transfer with pt's nurse.  Pt's wife report they have manual w/c at home already for pt.  Reviewed home modifications, safe sitting  surfaces, pacing with activity, and assist levels required for safe functional mobility: both pt and pt's wife verbalizing appropriate understanding.  Pt and pt's wife verbalizing no further PT questions or concerns for hospital discharge and report having appropriate assist for van transfer, "bumping" pt up stairs into home via w/c, and functional mobility within home.  Plan to discharge home today.   Follow Up Recommendations  Home health PT;Supervision/Assistance - 24 hour     Equipment Recommendations  Rolling walker with 5" wheels;3in1 (PT)    Recommendations for Other Services OT consult     Precautions / Restrictions Precautions Precautions: Fall;Knee Precaution Booklet Issued: Yes (comment) Restrictions Weight Bearing Restrictions: Yes RLE Weight Bearing: Weight bearing as tolerated LLE Weight Bearing: Weight bearing as tolerated    Mobility  Bed Mobility Overal bed mobility: Needs Assistance Bed Mobility: Supine to Sit     Supine to sit: Supervision     General bed mobility comments: increased effort and time for pt to perform on own; bed flat  Transfers Overall transfer level: Needs assistance Equipment used: Rolling walker (2 wheeled) Transfers: Sit to/from Stand Sit to Stand: Min guard;+2 physical assistance Stand pivot transfers: Min guard;+2 physical assistance(bed to recliner)       General transfer comment: pt stood from elevated bed and then from recliner (to simulate home set-up heights) with CGA x2 for safety; no vc's required for technique  Ambulation/Gait Ambulation/Gait assistance: Min guard;+2 physical assistance;+2 safety/equipment Gait Distance (Feet): 100 Feet Assistive device: Rolling walker (2 wheeled)   Gait velocity:  decreased   General Gait Details: decreased stance time L LE compared to R LE; increased UE support through RW noted when advancing R LE; steady with RW; therapist limited distance d/t concerns of overdoing it (pt  progressing significantly with ambulation distance today); mild B knee flexion noted during ambulation   Stairs Stairs: (pt's family plan to "bump" pt up steps in w/c)           Wheelchair Mobility    Modified Rankin (Stroke Patients Only)       Balance Overall balance assessment: Needs assistance Sitting-balance support: No upper extremity supported;Feet supported Sitting balance-Leahy Scale: Good Sitting balance - Comments: steady sitting reaching within BOS     Standing balance-Leahy Scale: Good Standing balance comment: steady standing reaching within BOS                            Cognition Arousal/Alertness: Awake/alert Behavior During Therapy: Anxious Overall Cognitive Status: Within Functional Limits for tasks assessed                                        Exercises Total Joint Exercises Ankle Circles/Pumps: AROM;Strengthening;Both;10 reps;Supine Quad Sets: AROM;Strengthening;Both;10 reps;Supine Short Arc Quad: AROM;Right;AAROM;Left;Strengthening;10 reps;Supine Heel Slides: AAROM;Strengthening;Both;10 reps;Supine Hip ABduction/ADduction: AROM;Right;AAROM;Left;10 reps;Supine Straight Leg Raises: AROM;Right;AAROM;Left;10 reps;Supine Goniometric ROM: R knee extension AAROM 3 degrees short of neutral and L knee AAROM extension 5 degrees short of neutral semi-supine in bed;  R knee flexion AAROM to 95 degrees; L knee flexion AAROM to 93 degrees    General Comments   Nursing cleared pt for participation in physical therapy.  Pt agreeable to PT session.      Pertinent Vitals/Pain  6/10 B knee pain beginning and end of session.    Home Living                      Prior Function            PT Goals (current goals can now be found in the care plan section) Acute Rehab PT Goals Patient Stated Goal: To return home PT Goal Formulation: With patient/family Time For Goal Achievement: 03/14/18 Potential to Achieve Goals:  Fair Progress towards PT goals: Progressing toward goals    Frequency    BID      PT Plan Discharge plan needs to be updated    Co-evaluation              AM-PAC PT "6 Clicks" Mobility   Outcome Measure  Help needed turning from your back to your side while in a flat bed without using bedrails?: A Little Help needed moving from lying on your back to sitting on the side of a flat bed without using bedrails?: A Little Help needed moving to and from a bed to a chair (including a wheelchair)?: A Little Help needed standing up from a chair using your arms (e.g., wheelchair or bedside chair)?: A Little Help needed to walk in hospital room?: A Little Help needed climbing 3-5 steps with a railing? : Total 6 Click Score: 16    End of Session Equipment Utilized During Treatment: Gait belt Activity Tolerance: Patient tolerated treatment well Patient left: in chair;with call bell/phone within reach;with chair alarm set;with family/visitor present;with SCD's reapplied;Other (comment)(B heels elevated via towel rolls; polar care in place and activated) Nurse Communication: Mobility  status;Precautions;Weight bearing status;Other (comment)(car transfer details) PT Visit Diagnosis: Unsteadiness on feet (R26.81);Difficulty in walking, not elsewhere classified (R26.2);Other abnormalities of gait and mobility (R26.89);Muscle weakness (generalized) (M62.81);Pain Pain - Right/Left: Left(Right) Pain - part of body: Knee     Time: [id number] PT Time Calculation (min) (ACUTE ONLY): 102 min  Charges:  $Gait Training: 8-22 mins $Therapeutic Exercise: 23-37 mins $Therapeutic Activity: 38-52 mins                    Hendricks Limesmily Wallis Vancott, PT 03/06/18, 5:47 PM [phone]

## 2018-03-15 ENCOUNTER — Other Ambulatory Visit: Payer: Self-pay | Admitting: Orthopedic Surgery

## 2018-03-15 ENCOUNTER — Ambulatory Visit
Admission: RE | Admit: 2018-03-15 | Discharge: 2018-03-15 | Disposition: A | Discharge status: Home / Self Care | Payer: PRIVATE HEALTH INSURANCE | Source: Ambulatory Visit | Attending: Orthopedic Surgery | Admitting: Orthopedic Surgery

## 2018-03-15 DIAGNOSIS — M79604 Pain in right leg: Secondary | ICD-10-CM | POA: Insufficient documentation

## 2018-03-15 DIAGNOSIS — I82451 Acute embolism and thrombosis of right peroneal vein: Secondary | ICD-10-CM | POA: Insufficient documentation

## 2018-03-15 DIAGNOSIS — I82441 Acute embolism and thrombosis of right tibial vein: Secondary | ICD-10-CM | POA: Insufficient documentation

## 2018-03-15 DIAGNOSIS — I82411 Acute embolism and thrombosis of right femoral vein: Secondary | ICD-10-CM | POA: Insufficient documentation

## 2018-03-15 DIAGNOSIS — I82431 Acute embolism and thrombosis of right popliteal vein: Secondary | ICD-10-CM | POA: Insufficient documentation

## 2018-03-26 ENCOUNTER — Ambulatory Visit (INDEPENDENT_AMBULATORY_CARE_PROVIDER_SITE_OTHER): Payer: PRIVATE HEALTH INSURANCE | Admitting: Vascular Surgery

## 2018-03-26 ENCOUNTER — Encounter (INDEPENDENT_AMBULATORY_CARE_PROVIDER_SITE_OTHER): Payer: Self-pay | Admitting: Vascular Surgery

## 2018-03-26 ENCOUNTER — Other Ambulatory Visit: Payer: Self-pay

## 2018-03-26 VITALS — Ht 68.0 in | Wt 275.0 lb

## 2018-03-26 DIAGNOSIS — I87009 Postthrombotic syndrome without complications of unspecified extremity: Secondary | ICD-10-CM

## 2018-03-26 DIAGNOSIS — I2699 Other pulmonary embolism without acute cor pulmonale: Secondary | ICD-10-CM

## 2018-03-26 DIAGNOSIS — M1712 Unilateral primary osteoarthritis, left knee: Secondary | ICD-10-CM

## 2018-03-26 DIAGNOSIS — D6859 Other primary thrombophilia: Secondary | ICD-10-CM | POA: Diagnosis not present

## 2018-03-26 DIAGNOSIS — Z87891 Personal history of nicotine dependence: Secondary | ICD-10-CM

## 2018-03-26 NOTE — Progress Notes (Signed)
MRN : [id number]  Frank Rivera is a 57 y.o. (Aug 24, 1960) male who presents with chief complaint of  Chief Complaint  Patient presents with  . Follow-up  .  History of Present Illness:   Placement of a Denali IVC filter on 02/21/2018.  He is s/p successful bilateral kne replacements on 02/27/2018.  He is doing well and has been working with his physical therapy.  He denies SOB or pleuritic chest pain. He has moderate swelling of his right leg He is on his anticoagulation.  Current Meds  Medication Sig  . diphenhydramine-acetaminophen (TYLENOL PM) 25-500 MG TABS tablet Take 2 tablets by mouth at bedtime as needed (for sleep).   . docusate sodium (COLACE) 100 MG capsule Take 1 capsule (100 mg total) by mouth 2 (two) times daily.  . methocarbamol (ROBAXIN) 500 MG tablet Take 1 tablet (500 mg total) by mouth every 6 (six) hours as needed for muscle spasms.  . metoprolol tartrate (LOPRESSOR) 25 MG tablet Take 1 tablet (25 mg total) by mouth 2 (two) times daily.  Marland Kitchen oxyCODONE-acetaminophen (PERCOCET/ROXICET) 5-325 MG tablet Take 1-2 tablets by mouth every 4 (four) hours as needed for moderate pain or severe pain.  . polyethylene glycol (MIRALAX / GLYCOLAX) packet Take 17 g by mouth daily.  . simvastatin (ZOCOR) 40 MG tablet Take 60 mg by mouth at bedtime.   Marland Kitchen warfarin (COUMADIN) 5 MG tablet Take 5 mg by mouth as directed.    Past Medical History:  Diagnosis Date  . Dyspnea   . Dyspnea on exertion   . Elevated lipids   . Hx of blood clots    legs  . Pulmonary emboli (HCC)   . Pulmonary embolism (HCC) 2011  . Pulmonary embolism (HCC)    2017    Past Surgical History:  Procedure Laterality Date  . APPENDECTOMY     as child  . IVC FILTER INSERTION Right 02/21/2018   Procedure: IVC FILTER INSERTION;  Surgeon: Renford Dills, MD;  Location: [facility];  Service: Cardiovascular;  Laterality: Right;  . TONSILLECTOMY AND ADENOIDECTOMY     as child  . TOTAL KNEE  ARTHROPLASTY Bilateral 02/27/2018   Procedure: TOTAL KNEE BILATERAL;  Surgeon: Kennedy Bucker, MD;  Location: [facility];  Service: Orthopedics;  Laterality: Bilateral;    Social History Social History   Tobacco Use  . Smoking status: Former Smoker    Packs/day: 1.00    Years: 25.00    Pack years: 25.00    Types: Cigarettes    Last attempt to quit: 11/17/2015    Years since quitting: 2.3  . Smokeless tobacco: Never Used  Substance Use Topics  . Alcohol use: Yes    Comment: rare  . Drug use: No    Family History Family History  Problem Relation Age of Onset  . Melanoma Father        face  . Colon cancer Paternal Uncle        died of colon ca - dx at age 55's  . Diabetes Neg Hx     Allergies  Allergen Reactions  . Sulfa Antibiotics Other (See Comments)    As a child-told he had reaction to sulfa drugs.     REVIEW OF SYSTEMS (Negative unless checked)  Constitutional: [] Weight loss  [] Fever  [] Chills Cardiac: [] Chest pain   [] Chest pressure   [] Palpitations   [] Shortness of breath when laying flat   [] Shortness of breath with exertion. Vascular:  [x] Pain in legs  with walking   [] Pain in legs at rest  [x] History of DVT   [] Phlebitis   [] Swelling in legs   [] Varicose veins   [] Non-healing ulcers Pulmonary:   [] Uses home oxygen   [] Productive cough   [] Hemoptysis   [] Wheeze  [] COPD   [] Asthma Neurologic:  [] Dizziness   [] Seizures   [] History of stroke   [] History of TIA  [] Aphasia   [] Vissual changes   [] Weakness or numbness in arm   [] Weakness or numbness in leg Musculoskeletal:   [] Joint swelling   [x] Joint pain   [] Low back pain Hematologic:  [] Easy bruising  [] Easy bleeding   [] Hypercoagulable state   [] Anemic Gastrointestinal:  [] Diarrhea   [] Vomiting  [] Gastroesophageal reflux/heartburn   [] Difficulty swallowing. Genitourinary:  [] Chronic kidney disease   [] Difficult urination  [] Frequent urination   [] Blood in urine Skin:  [] Rashes   [] Ulcers  Psychological:  [] History  of anxiety   []  History of major depression.  Physical Examination  Vitals:   03/26/18 0830  Weight: 275 lb (124.7 kg)  Height: 5\' 8"  (1.727 m)   Body mass index is 41.81 kg/m. Gen: WD/WN, NAD Head: [place]/AT, No temporalis wasting.  Ear/Nose/Throat: Hearing grossly intact, nares w/o erythema or drainage Eyes: PER, EOMI, sclera nonicteric.  Neck: Supple, no large masses.   Pulmonary:  Good air movement, no audible wheezing bilaterally, no use of accessory muscles.  Cardiac: RRR, no JVD Vascular: scattered varicosities present bilaterally.  Mild venous stasis changes to the legs bilaterally.  4+ soft pitting edema right leg and 2+ pitting edema of the left leg Vessel Right Left  Radial Palpable Palpable  PT Palpable Palpable  DP Palpable Palpable  Gastrointestinal: Non-distended. No guarding/no peritoneal signs.  Musculoskeletal: M/S 5/5 throughout.  No deformity or atrophy.  Neurologic: CN 2-12 intact. Symmetrical.  Speech is fluent. Motor exam as listed above. Psychiatric: Judgment intact, Mood & affect appropriate for pt's clinical situation. Dermatologic: No rashes or ulcers noted.  No changes consistent with cellulitis. Lymph : No lichenification or skin changes of chronic lymphedema.  CBC Lab Results  Component Value Date   WBC 6.6 03/05/2018   HGB 8.5 (L) 03/05/2018   HCT 25.8 (L) 03/05/2018   MCV 100.4 (H) 03/05/2018   PLT 219 03/05/2018    BMET    Component Value Date/Time   NA 137 03/03/2018 0308   K 3.8 03/03/2018 0308   CL 101 03/03/2018 0308   CO2 25 03/03/2018 0308   GLUCOSE 125 (H) 03/03/2018 0308   BUN 29 (H) 03/03/2018 0308   CREATININE 0.63 03/06/2018 0451   CALCIUM 8.4 (L) 03/03/2018 0308   GFRNONAA >60 03/06/2018 0451   GFRAA >60 03/06/2018 0451   Estimated Creatinine Clearance: 134.1 mL/min (by C-G formula based on SCr of 0.63 mg/dL).  COAG Lab Results  Component Value Date   INR 2.54 03/06/2018   INR 3.09 03/05/2018   INR 2.17 03/04/2018      Radiology Dg Chest 2 View  Result Date: 03/02/2018 CLINICAL DATA:  Tachycardia. EXAM: CHEST - 2 VIEW COMPARISON:  Radiographs of November 17, 2015. FINDINGS: The heart size and mediastinal contours are within normal limits. Both lungs are clear. No pneumothorax or pleural effusion is noted. The visualized skeletal structures are unremarkable. IMPRESSION: No active cardiopulmonary disease. Electronically Signed   By: Lupita Raider, M.D.   On: 03/02/2018 15:17   Dg Knee 1-2 Views Left  Result Date: 02/27/2018 CLINICAL DATA:  Postop pain EXAM: LEFT KNEE - 1-2 VIEW  COMPARISON:  None. FINDINGS: Changes of left knee replacement. No hardware bony complicating feature. Soft tissue and joint space gas noted. IMPRESSION: Left knee replacement.  No visible complicating feature. Electronically Signed   By: Charlett NoseKevin  Dover M.D.   On: 02/27/2018 20:30   Dg Knee 1-2 Views Right  Result Date: 02/27/2018 CLINICAL DATA:  Knee replacement EXAM: RIGHT KNEE - 1-2 VIEW COMPARISON:  None. FINDINGS: Changes of right knee replacement. No hardware or bony complicating feature. Soft tissue gas noted. IMPRESSION: Right knee replacement.  No complicating feature. Electronically Signed   By: Charlett NoseKevin  Dover M.D.   On: 02/27/2018 20:30   Dg Abd 1 View  Result Date: 03/04/2018 CLINICAL DATA:  Follow-up of ileus. Postop for knee replacement. Vomiting. Abdominal stent shin. EXAM: ABDOMEN - 1 VIEW COMPARISON:  03/03/2018 FINDINGS: Two supine views. IVC filter. Mild decrease in gaseous distension of large and small bowel. Distal gas identified. No pneumatosis or gross free intraperitoneal air. No abnormal abdominal calcifications. IMPRESSION: Slight improvement in gaseous distension of large and small bowel, favoring resolving adynamic ileus. Electronically Signed   By: Jeronimo GreavesKyle  Talbot M.D.   On: 03/04/2018 10:36   Dg Abd 1 View  Result Date: 03/03/2018 CLINICAL DATA:  57 year old male with abdominal pain, constipation and suspected  postoperative ileus EXAM: ABDOMEN - 1 VIEW COMPARISON:  Prior abdominal radiographs 03/02/2017 FINDINGS: Persisting gaseous distension of the colon. Gas is also noted within loops of small bowel in the mid abdomen although the amount of gas within small bowel has decreased compared to the prior radiograph. A Bard Denali potentially retrievable IVC filter is noted within the expected location. Mild bilateral hip joint osteoarthritis. No acute osseous abnormality. IMPRESSION: 1. Decreasing gaseous distension of the small bowel but persistent gas throughout the colon. Findings suggest improving ileus. 2. Potentially retrievable IVC filter in place. Electronically Signed   By: Malachy MoanHeath  McCullough M.D.   On: 03/03/2018 09:26   Dg Abd 1 View  Result Date: 03/02/2018 CLINICAL DATA:  Abdominal pain for 2 days following previous knee surgery EXAM: ABDOMEN - 1 VIEW COMPARISON:  None. FINDINGS: Diffuse small and large bowel gas is noted. Some mild prominence of the small bowel is seen likely representing a postoperative ileus. No free air is seen. IVC filter is noted in satisfactory position. Degenerative changes of the lumbar spine are noted. IMPRESSION: Scattered large and small bowel gas likely related to a postoperative ileus. Correlation with the physical exam is recommended. Electronically Signed   By: Alcide CleverMark  Lukens M.D.   On: 03/02/2018 10:17   [place] Venous Img Lower Unilateral Right  Result Date: 03/15/2018 CLINICAL DATA:  Right lower extremity pain and for 10 days, history of bilateral knee replacements EXAM: RIGHT LOWER EXTREMITY VENOUS DOPPLER ULTRASOUND TECHNIQUE: Gray-scale sonography with graded compression, as well as color Doppler and duplex ultrasound were performed to evaluate the lower extremity deep venous systems from the level of the common femoral vein and including the common femoral, femoral, profunda femoral, popliteal and calf veins including the posterior tibial, peroneal and gastrocnemius veins  when visible. The superficial great saphenous vein was also interrogated. Spectral Doppler was utilized to evaluate flow at rest and with distal augmentation maneuvers in the common femoral, femoral and popliteal veins. COMPARISON:  None. FINDINGS: Contralateral Common Femoral Vein: Respiratory phasicity is normal and symmetric with the symptomatic side. No evidence of thrombus. Normal compressibility. Common Femoral Vein: Hypoechoic intraluminal thrombus appearing occlusive. Vessel is noncompressible. Saphenofemoral Junction: Femoral thrombus extends into the saphenofemoral  junction. Profunda Femoral Vein: Hypoechoic thrombus appearing occlusive. Vessel is noncompressible. No flow detected. Femoral Vein: Similar diffuse hypoechoic thrombus throughout. Vessel is noncompressible. Thrombus appears occlusive Popliteal Vein: Similar hypoechoic thrombus appearing occlusive. Vessel is noncompressible. Calf Veins: Thrombus does appear to extend into the tibial and peroneal veins. Limited assessment because of edema. Superficial Great Saphenous Vein: No evidence of thrombus. Normal compressibility. Venous Reflux:  None. Other Findings:  None. IMPRESSION: Acute appearing occlusive right lower extremity femoropopliteal DVT extending into the calf tibial and peroneal veins. Overall very large thrombus burden. These results will be called to the ordering clinician or representative by the Radiologist Assistant, and communication documented in the PACS or zVision Dashboard. Electronically Signed   By: Judie PetitM.  Shick M.D.   On: 03/15/2018 12:09     Assessment/Plan 1. Pulmonary embolism, bilateral (HCC) The patient will continue anticoagulation for now as there have not been any problems or complications at this point.  IVC filter will be removed about 3 months after his surgery.  IVC filter removal will be done in late March. Risk and benefits were reviewed the patient.  Indications for the procedure were reviewed.  All  questions were answered, the patient agrees to proceed.   I have had a long discussion with the patient regarding DVT and post phlebitic changes such as swelling and why it  causes symptoms such as pain.  The patient will wear graduated compression stockings class 1 (20-30 mmHg) on a daily basis a prescription was given. The patient will  beginning wearing the stockings first thing in the morning and removing them in the evening. The patient is instructed specifically not to sleep in the stockings.  In addition, behavioral modification including elevation during the day and avoidance of prolonged dependency will be initiated.    The patient will follow-up in 3 months after the joint replacement surgery to for removal (this was also discussed today and the patient agrees with the plan to have the filter removed).   Also given his edema of the right leg a Lymph pump is indicated and we will move forward in with this in 3 months   2. 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    I have reviewed my previous discussion with the patient regarding swelling and why it causes symptoms.  Patient will continue wearing graduated compression stockings class 1 (20-30 mmHg) on a daily basis. The patient will  beginning wearing the stockings first thing in the morning and removing them in the evening. The patient is instructed specifically not to sleep in the stockings.    In addition, behavioral modification including several periods of elevation of the lower extremities during the day will be continued.  This was reviewed with the patient during the initial visit.  The patient will also continue routine exercise, especially walking on a daily basis as was discussed during the initial visit.    Despite conservative treatments including graduated compression therapy class 1 and behavioral modification including exercise and elevation the patient  has  not obtained adequate control of the lymphedema.  The patient still has stage 3 lymphedema and therefore, I believe that a lymph pump should be added to improve the control of the patient's lymphedema.  Additionally, a lymph pump is warranted because it will reduce the risk of cellulitis and ulceration in the future.  Patient should follow-up in six months    3. Primary osteoarthritis of left knee Continue PT  and continue compression  4. Primary hypercoagulable state (HCC) Continue anticoagulation    Levora Dredge, MD  03/26/2018 8:40 AM

## 2018-04-09 ENCOUNTER — Encounter (INDEPENDENT_AMBULATORY_CARE_PROVIDER_SITE_OTHER): Payer: Self-pay

## 2018-04-25 ENCOUNTER — Telehealth (INDEPENDENT_AMBULATORY_CARE_PROVIDER_SITE_OTHER): Payer: Self-pay

## 2018-04-25 NOTE — Telephone Encounter (Signed)
Left message on [phone] (mobile) for patient to call office back

## 2018-04-25 NOTE — Telephone Encounter (Signed)
There is no medication that he can take for it.  In order to treat swelling he needs to wear compression stockings on a daily basis.  He should place them in the morning and remove them prior to bedtime.  He should not sleep in the compression stockings.  He also needs to elevate his legs whenever possible.  If his swelling has not subsided after wearing medical grade 1 compression stockings and elevating his legs for a week then we can bring him in for noninvasive studies.

## 2018-04-25 NOTE — Telephone Encounter (Signed)
Patient called and left a message on the triage line and stated that he had surgery back in November and is now experiencing swelling in both legs and feet. Would like to know what medication he can take for this or what he could do for it. Please advise

## 2018-04-26 NOTE — Telephone Encounter (Signed)
Spoke with patient and he is aware of recommendations he stated that he does not wear his compression stockings like he should. He said he will follow the instructions and call back in a week for an appointment if not better.

## 2018-06-13 ENCOUNTER — Telehealth (INDEPENDENT_AMBULATORY_CARE_PROVIDER_SITE_OTHER): Payer: Self-pay | Admitting: Vascular Surgery

## 2018-06-13 NOTE — Telephone Encounter (Signed)
Patient surgery that was scheduled for 06/26/18 has been canceled and will be rescheduled after may due to COVID19. Patient is aware of this and verbalized understanding. AS, CMA

## 2018-06-25 ENCOUNTER — Ambulatory Visit (INDEPENDENT_AMBULATORY_CARE_PROVIDER_SITE_OTHER): Payer: PRIVATE HEALTH INSURANCE | Admitting: Vascular Surgery

## 2018-06-26 ENCOUNTER — Ambulatory Visit: Admit: 2018-06-26 | Payer: No Typology Code available for payment source | Admitting: Vascular Surgery

## 2018-06-26 SURGERY — IVC FILTER REMOVAL
Anesthesia: Moderate Sedation | Laterality: Right

## 2018-07-16 ENCOUNTER — Telehealth (INDEPENDENT_AMBULATORY_CARE_PROVIDER_SITE_OTHER): Payer: Self-pay

## 2018-07-16 NOTE — Telephone Encounter (Signed)
We can move up his appointment with Dr. Gilda Crease to Thursday or Monday.

## 2018-07-19 ENCOUNTER — Encounter (INDEPENDENT_AMBULATORY_CARE_PROVIDER_SITE_OTHER): Payer: Self-pay | Admitting: Vascular Surgery

## 2018-07-19 ENCOUNTER — Ambulatory Visit (INDEPENDENT_AMBULATORY_CARE_PROVIDER_SITE_OTHER): Payer: PRIVATE HEALTH INSURANCE | Admitting: Vascular Surgery

## 2018-07-19 ENCOUNTER — Other Ambulatory Visit: Payer: Self-pay

## 2018-07-19 VITALS — BP 182/82 | HR 101 | Resp 17 | Ht 69.0 in | Wt 278.0 lb

## 2018-07-19 DIAGNOSIS — I2699 Other pulmonary embolism without acute cor pulmonale: Secondary | ICD-10-CM | POA: Diagnosis not present

## 2018-07-19 DIAGNOSIS — I87009 Postthrombotic syndrome without complications of unspecified extremity: Secondary | ICD-10-CM | POA: Diagnosis not present

## 2018-07-19 DIAGNOSIS — Z96653 Presence of artificial knee joint, bilateral: Secondary | ICD-10-CM | POA: Diagnosis not present

## 2018-07-19 DIAGNOSIS — Z791 Long term (current) use of non-steroidal anti-inflammatories (NSAID): Secondary | ICD-10-CM

## 2018-07-19 DIAGNOSIS — Z95828 Presence of other vascular implants and grafts: Secondary | ICD-10-CM | POA: Diagnosis not present

## 2018-07-19 DIAGNOSIS — M1712 Unilateral primary osteoarthritis, left knee: Secondary | ICD-10-CM

## 2018-07-19 DIAGNOSIS — Z7901 Long term (current) use of anticoagulants: Secondary | ICD-10-CM

## 2018-07-19 DIAGNOSIS — Z79899 Other long term (current) drug therapy: Secondary | ICD-10-CM

## 2018-07-19 DIAGNOSIS — E7800 Pure hypercholesterolemia, unspecified: Secondary | ICD-10-CM

## 2018-07-19 NOTE — Progress Notes (Signed)
MRN : [id number]  Frank Rivera is a 58 y.o. (1960/09/09) male who presents with chief complaint of No chief complaint on file. Marland Kitchen  History of Present Illness:   Placement of a DenaliIVC filter on 02/21/2018.  He is s/p successful bilateral kne replacements on 02/27/2018.  He is doing well and has been working with his physical therapy.  He denies SOB or pleuritic chest pain. He has moderate swelling of his right leg He is on his anticoagulation.  No outpatient medications have been marked as taking for the 07/19/18 encounter (Office Visit) with Gilda Crease, Latina Craver, MD.    Past Medical History:  Diagnosis Date  . Dyspnea   . Dyspnea on exertion   . Elevated lipids   . Hx of blood clots    legs  . Pulmonary emboli (HCC)   . Pulmonary embolism (HCC) 2011  . Pulmonary embolism (HCC)    2017    Past Surgical History:  Procedure Laterality Date  . APPENDECTOMY     as child  . IVC FILTER INSERTION Right 02/21/2018   Procedure: IVC FILTER INSERTION;  Surgeon: Renford Dills, MD;  Location: [facility];  Service: Cardiovascular;  Laterality: Right;  . TONSILLECTOMY AND ADENOIDECTOMY     as child  . TOTAL KNEE ARTHROPLASTY Bilateral 02/27/2018   Procedure: TOTAL KNEE BILATERAL;  Surgeon: Kennedy Bucker, MD;  Location: [facility];  Service: Orthopedics;  Laterality: Bilateral;    Social History Social History   Tobacco Use  . Smoking status: Former Smoker    Packs/day: 1.00    Years: 25.00    Pack years: 25.00    Types: Cigarettes    Last attempt to quit: 11/17/2015    Years since quitting: 2.6  . Smokeless tobacco: Never Used  Substance Use Topics  . Alcohol use: Yes    Comment: rare  . Drug use: No    Family History Family History  Problem Relation Age of Onset  . Melanoma Father        face  . Colon cancer Paternal Uncle        died of colon ca - dx at age 84's  . Diabetes Neg Hx     Allergies  Allergen Reactions  . Sulfa Antibiotics Other  (See Comments)    As a child-told he had reaction to sulfa drugs.     REVIEW OF SYSTEMS (Negative unless checked)  Constitutional: Weight loss  Fever  Chills Cardiac: Chest pain   Chest pressure   Palpitations   Shortness of breath when laying flat   Shortness of breath with exertion. Vascular:  Pain in legs with walking   Pain in legs at rest  History of DVT   Phlebitis   Swelling in legs   Varicose veins   Non-healing ulcers Pulmonary:   Uses home oxygen   Productive cough   Hemoptysis   Wheeze  COPD   Asthma Neurologic:  Dizziness   Seizures   History of stroke   History of TIA  Aphasia   Vissual changes   Weakness or numbness in arm   Weakness or numbness in leg Musculoskeletal:   Joint swelling   Joint pain   Low back pain Hematologic:  Easy bruising  Easy bleeding   Hypercoagulable state   Anemic Gastrointestinal:  Diarrhea   Vomiting  Gastroesophageal reflux/heartburn   Difficulty swallowing. Genitourinary:  Chronic kidney disease   Difficult urination  Frequent urination   Blood in urine Skin:    Rashes   [] Ulcers  Psychological:  [] History of anxiety   []  History of major depression.  Physical Examination  There were no vitals filed for this visit. There is no height or weight on file to calculate BMI. Gen: WD/WN, NAD Head: [place]/AT, No temporalis wasting.  Ear/Nose/Throat: Hearing grossly intact, nares w/o erythema or drainage Eyes: PER, EOMI, sclera nonicteric.  Neck: Supple, no large masses.   Pulmonary:  Good air movement, no audible wheezing bilaterally, no use of accessory muscles.  Cardiac: RRR, no JVD Vascular: scattered varicosities present bilaterally.  Moderate venous stasis changes to the legs bilaterally.  3-4+ soft pitting edema Vessel Right Left  Radial Palpable Palpable  PT Palpable Palpable  DP Palpable Palpable  Gastrointestinal: Non-distended. No guarding/no  peritoneal signs.  Musculoskeletal: M/S 5/5 throughout.  No deformity or atrophy.  Neurologic: CN 2-12 intact. Symmetrical.  Speech is fluent. Motor exam as listed above. Psychiatric: Judgment intact, Mood & affect appropriate for pt's clinical situation. Dermatologic: venous rashes no ulcers noted.  No changes consistent with cellulitis. Lymph : No lichenification or skin changes of chronic lymphedema.  CBC Lab Results  Component Value Date   WBC 6.6 03/05/2018   HGB 8.5 (L) 03/05/2018   HCT 25.8 (L) 03/05/2018   MCV 100.4 (H) 03/05/2018   PLT 219 03/05/2018    BMET    Component Value Date/Time   NA 137 03/03/2018 0308   K 3.8 03/03/2018 0308   CL 101 03/03/2018 0308   CO2 25 03/03/2018 0308   GLUCOSE 125 (H) 03/03/2018 0308   BUN 29 (H) 03/03/2018 0308   CREATININE 0.63 03/06/2018 0451   CALCIUM 8.4 (L) 03/03/2018 0308   GFRNONAA >60 03/06/2018 0451   GFRAA >60 03/06/2018 0451   CrCl cannot be calculated (Patient's most recent lab result is older than the maximum 21 days allowed.).  COAG Lab Results  Component Value Date   INR 2.54 03/06/2018   INR 3.09 03/05/2018   INR 2.17 03/04/2018    Radiology No results found.    Assessment/Plan 1. Pulmonary embolism, bilateral (HCC) The patient will continue anticoagulation for now as there have not been any problems or complications at this point.  IVC filter is ready to be removed.    Risk and benefits were reviewed the patient.  Indications for the procedure were reviewed.  All questions were answered, the patient agrees to proceed.   I have had a long discussion with the patient regarding DVT and post phlebitic changes such as swelling and why it  causes symptoms such as pain.  The patient will wear graduated compression stockings class 1 (20-30 mmHg) on a daily basis a prescription was given. The patient will  beginning wearing the stockings first thing in the morning and removing them in the evening. The patient is  instructed specifically not to sleep in the stockings.  In addition, behavioral modification including elevation during the day and avoidance of prolonged dependency will be initiated.    The patient will follow-up with me in 3 months after the joint replacement surgery to discuss removal (this was also discussed today and the patient agrees with the plan to have the filter removed).    2. 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    I have reviewed my previous discussion with the patient regarding swelling and why it causes symptoms.  Patient will continue wearing graduated compression stockings class 1 (20-30 mmHg) on a daily basis.  The patient will  beginning wearing the stockings first thing in the morning and removing them in the evening. The patient is instructed specifically not to sleep in the stockings.    In addition, behavioral modification including several periods of elevation of the lower extremities during the day will be continued.  This was reviewed with the patient during the initial visit.  The patient will also continue routine exercise, especially walking on a daily basis as was discussed during the initial visit.    Despite conservative treatments including graduated compression therapy class 1 and behavioral modification including exercise and elevation the patient  has not obtained adequate control of the lymphedema.  The patient still has stage 3 lymphedema and therefore, I believe that a lymph pump should be added to improve the control of the patient's lymphedema.  Additionally, a lymph pump is warranted because it will reduce the risk of cellulitis and ulceration in the future.  Patient should follow-up in six months    3. Primary osteoarthritis of left knee Continue NSAID medications as already ordered, these medications have been reviewed and there are no changes at this time.  Continued activity and  therapy was stressed.   4. Hypercholesteremia Continue statin as ordered and reviewed, no changes at this time     Levora Dredge, MD  07/19/2018 8:48 AM

## 2018-07-25 IMAGING — US US EXTREM LOW VENOUS*L*
1 series · 14 of 24 positions shown · non-contrast
Comparison: None

CLINICAL DATA: Swelling x2 weeks post ankle fracture.

EXAM:
LEFT LOWER EXTREMITY VENOUS DOPPLER ULTRASOUND
TECHNIQUE: Gray-scale sonography with compression, as well as color and duplex
ultrasound, were performed to evaluate the deep venous system from
the level of the common femoral vein through the popliteal and
proximal calf veins.

[Series 1: us extrem low venous*left* · 0.10mm/px · 14 of 40 slices shown]
[im 1/40]
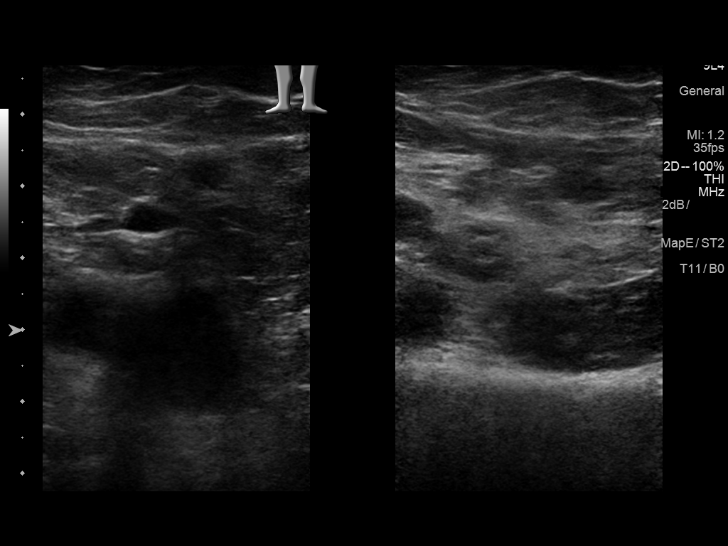
[im 4/40]
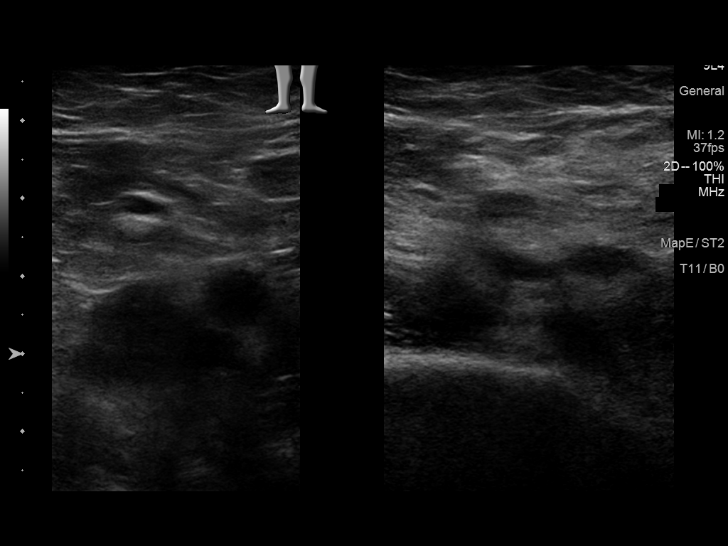
[im 7/40]
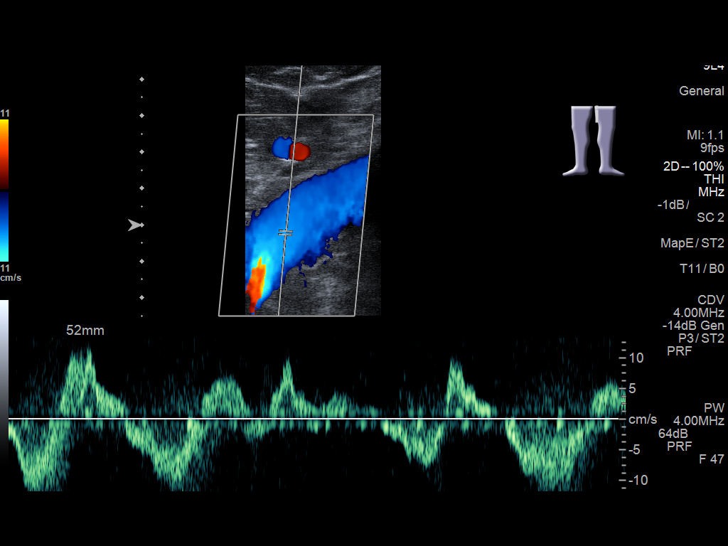
[im 11/40]
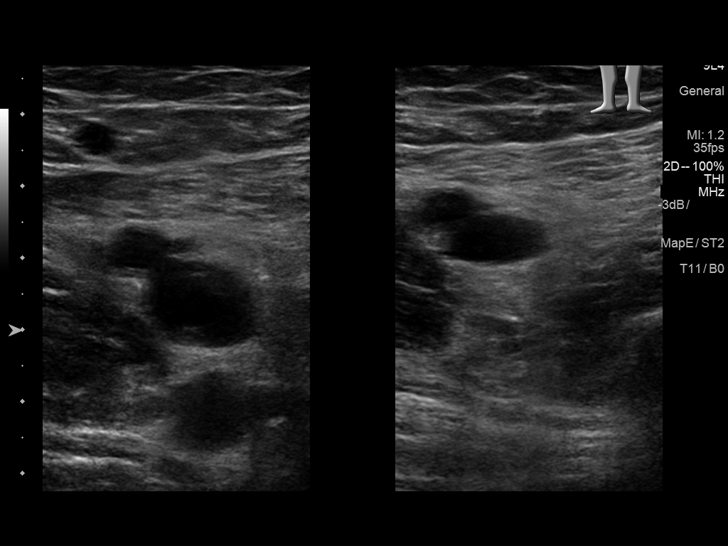
[im 12/40]
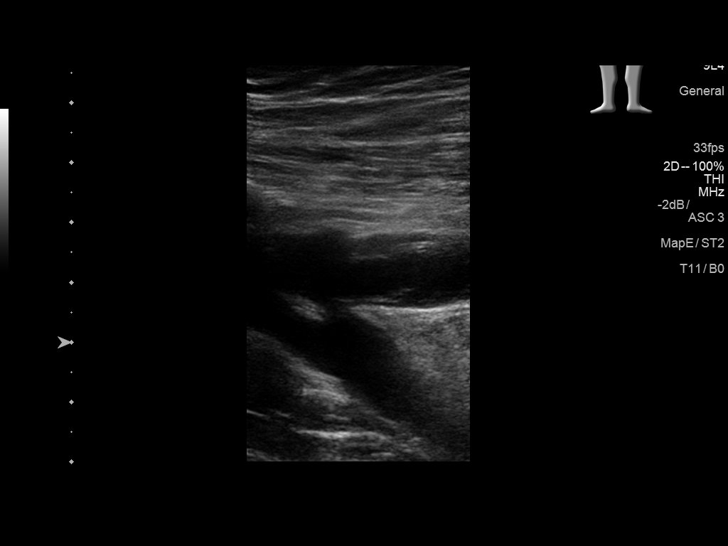
[im 16/40]
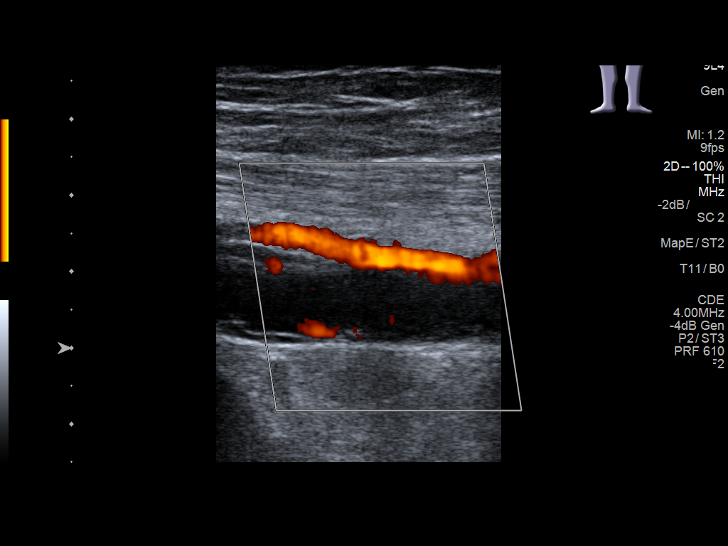
[im 19/40]
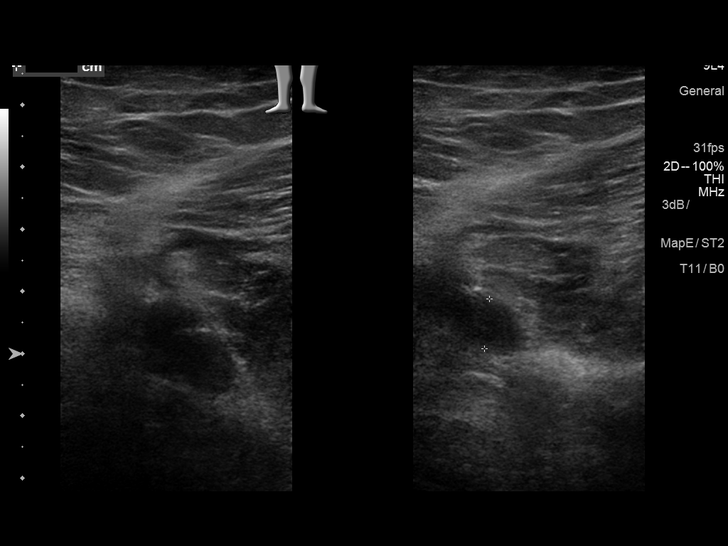
[im 21/40]
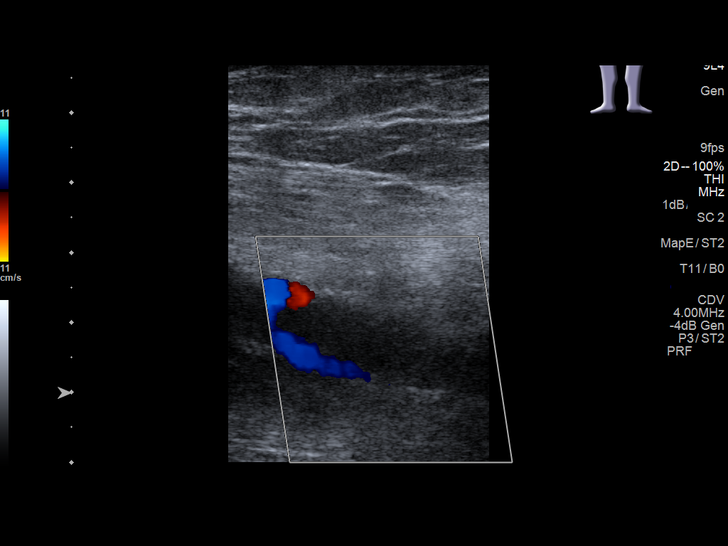
[im 24/40]
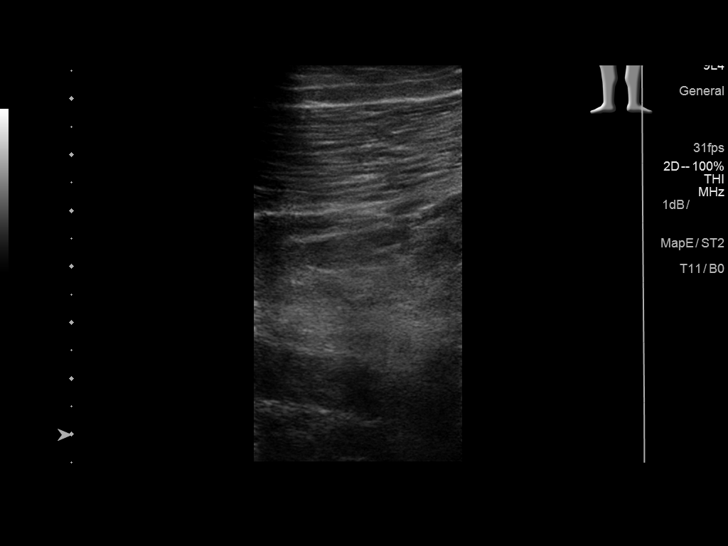
[im 28/40]
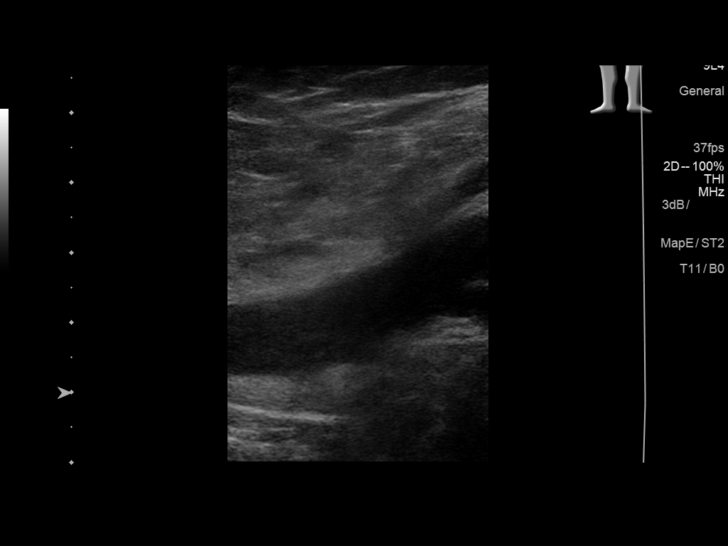
[im 31/40]
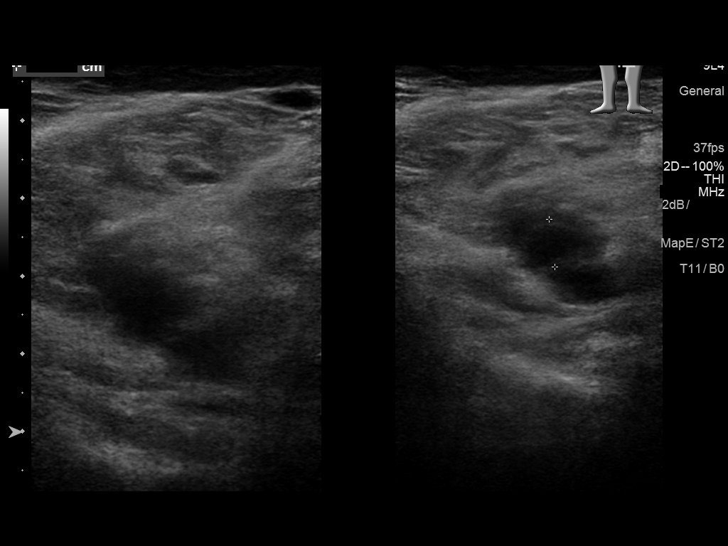
[im 33/40]
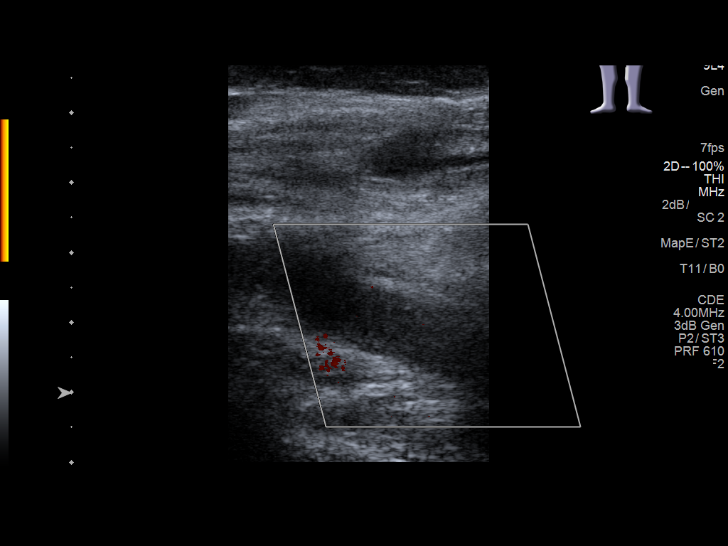
[im 36/40]
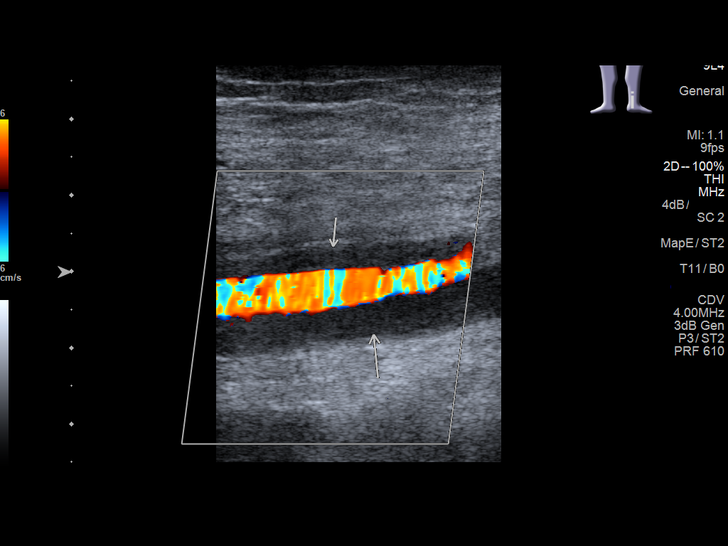
[im 40/40]
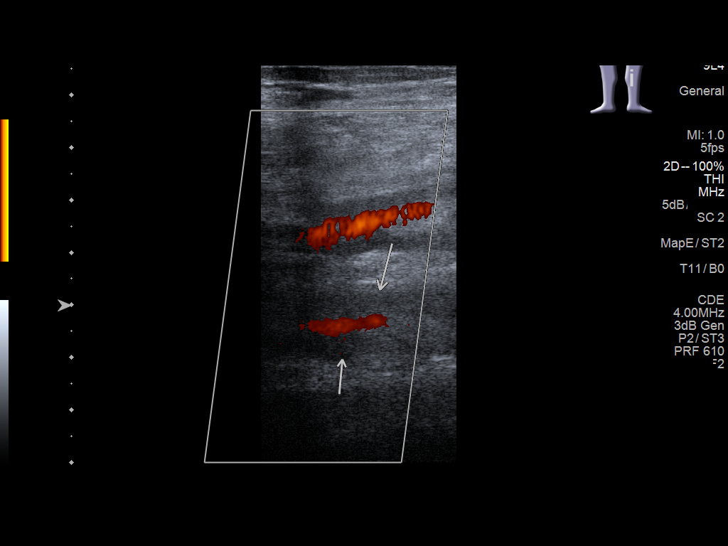

[14 of 24 positions shown; findings below may reference images not displayed]

FINDINGS: Occlusive thrombus in posterior tibial and peroneal veins extending
through the popliteal vein, with resultant incompressibility. No
significant color Doppler signal is evident. There is incompletely
occlusive noncompressible thrombus in the femoral and common femoral
veins, with some persistent color signal noted on color Doppler.
Survey views of the contralateral common femoral vein are
unremarkable.
IMPRESSION: 1. Extensive left calf and femoral-popliteal DVT as above.

These results will be called to the ordering clinician or
representative by the Radiologist Assistant, and communication
documented in the PACS or zVision Dashboard.

## 2018-07-30 ENCOUNTER — Telehealth (INDEPENDENT_AMBULATORY_CARE_PROVIDER_SITE_OTHER): Payer: Self-pay

## 2018-07-30 ENCOUNTER — Other Ambulatory Visit (INDEPENDENT_AMBULATORY_CARE_PROVIDER_SITE_OTHER): Payer: Self-pay | Admitting: Nurse Practitioner

## 2018-07-30 MED ORDER — CEFAZOLIN SODIUM-DEXTROSE 2-4 GM/100ML-% IV SOLN
2.0000 g | Freq: Once | INTRAVENOUS | Status: AC
  Administered 2018-07-31: 08:00:00 2 g via INTRAVENOUS

## 2018-07-30 NOTE — Telephone Encounter (Signed)
Spoke with the patient and scheduled him to have his IVC filter removed Tuesday 07/31/2018 with Dr. Gilda Crease. Patient was given pre-procedure instructions as well as the zero visitor policy information. Patient agreed to have his procedure on 07/31/2018.

## 2018-07-31 ENCOUNTER — Encounter
Admission: RE | Disposition: A | Discharge status: Home / Self Care | Payer: Self-pay | Source: Home / Self Care | Attending: Vascular Surgery

## 2018-07-31 ENCOUNTER — Encounter: Payer: Self-pay | Admitting: *Deleted

## 2018-07-31 ENCOUNTER — Ambulatory Visit
Admission: RE | Admit: 2018-07-31 | Discharge: 2018-07-31 | Disposition: A | Discharge status: Home / Self Care | Payer: No Typology Code available for payment source | Attending: Vascular Surgery | Admitting: Vascular Surgery

## 2018-07-31 ENCOUNTER — Other Ambulatory Visit: Payer: Self-pay

## 2018-07-31 DIAGNOSIS — I2699 Other pulmonary embolism without acute cor pulmonale: Secondary | ICD-10-CM | POA: Diagnosis not present

## 2018-07-31 DIAGNOSIS — M1712 Unilateral primary osteoarthritis, left knee: Secondary | ICD-10-CM | POA: Diagnosis not present

## 2018-07-31 DIAGNOSIS — Z95828 Presence of other vascular implants and grafts: Secondary | ICD-10-CM | POA: Diagnosis not present

## 2018-07-31 DIAGNOSIS — I87009 Postthrombotic syndrome without complications of unspecified extremity: Secondary | ICD-10-CM | POA: Insufficient documentation

## 2018-07-31 DIAGNOSIS — R0600 Dyspnea, unspecified: Secondary | ICD-10-CM | POA: Insufficient documentation

## 2018-07-31 DIAGNOSIS — E7800 Pure hypercholesterolemia, unspecified: Secondary | ICD-10-CM | POA: Diagnosis not present

## 2018-07-31 DIAGNOSIS — Z87891 Personal history of nicotine dependence: Secondary | ICD-10-CM | POA: Diagnosis not present

## 2018-07-31 DIAGNOSIS — Z7901 Long term (current) use of anticoagulants: Secondary | ICD-10-CM

## 2018-07-31 DIAGNOSIS — I82409 Acute embolism and thrombosis of unspecified deep veins of unspecified lower extremity: Secondary | ICD-10-CM

## 2018-07-31 DIAGNOSIS — Z882 Allergy status to sulfonamides status: Secondary | ICD-10-CM | POA: Diagnosis not present

## 2018-07-31 HISTORY — PX: IVC FILTER REMOVAL: CATH118246

## 2018-07-31 SURGERY — IVC FILTER REMOVAL
Anesthesia: Moderate Sedation

## 2018-07-31 MED ORDER — HYDROMORPHONE HCL 1 MG/ML IJ SOLN: 1.0000 mg | Freq: Once | INTRAMUSCULAR | Status: DC | PRN

## 2018-07-31 MED ORDER — MIDAZOLAM HCL 2 MG/ML PO SYRP: 8.0000 mg | ORAL_SOLUTION | Freq: Once | ORAL | Status: DC | PRN

## 2018-07-31 MED ORDER — FAMOTIDINE 20 MG PO TABS: 40.0000 mg | ORAL_TABLET | Freq: Once | ORAL | Status: DC | PRN

## 2018-07-31 MED ORDER — ONDANSETRON HCL 4 MG/2ML IJ SOLN: 4.0000 mg | Freq: Four times a day (QID) | INTRAMUSCULAR | Status: DC | PRN

## 2018-07-31 MED ORDER — LIDOCAINE HCL (PF) 1 % IJ SOLN
INTRAMUSCULAR | Status: AC
  Filled 2018-07-31: qty 30

## 2018-07-31 MED ORDER — HEPARIN SODIUM (PORCINE) 1000 UNIT/ML IJ SOLN
INTRAMUSCULAR | Status: AC
  Filled 2018-07-31: qty 1

## 2018-07-31 MED ORDER — IOHEXOL 300 MG/ML  SOLN
INTRAMUSCULAR | Status: DC | PRN
  Administered 2018-07-31: 15 mL via INTRAVENOUS

## 2018-07-31 MED ORDER — MIDAZOLAM HCL 5 MG/5ML IJ SOLN
INTRAMUSCULAR | Status: AC
  Filled 2018-07-31: qty 5

## 2018-07-31 MED ORDER — FENTANYL CITRATE (PF) 100 MCG/2ML IJ SOLN
INTRAMUSCULAR | Status: AC
  Filled 2018-07-31: qty 2

## 2018-07-31 MED ORDER — DIPHENHYDRAMINE HCL 50 MG/ML IJ SOLN: 50.0000 mg | Freq: Once | INTRAMUSCULAR | Status: DC | PRN

## 2018-07-31 MED ORDER — SODIUM CHLORIDE 0.9 % IV SOLN
INTRAVENOUS | Status: DC
  Administered 2018-07-31: 08:00:00 via INTRAVENOUS

## 2018-07-31 MED ORDER — MIDAZOLAM HCL 2 MG/2ML IJ SOLN
INTRAMUSCULAR | Status: DC | PRN
  Administered 2018-07-31 (×2): 1 mg via INTRAVENOUS
  Administered 2018-07-31: 2 mg via INTRAVENOUS
  Administered 2018-07-31: 1 mg via INTRAVENOUS

## 2018-07-31 MED ORDER — METHYLPREDNISOLONE SODIUM SUCC 125 MG IJ SOLR: 125.0000 mg | Freq: Once | INTRAMUSCULAR | Status: DC | PRN

## 2018-07-31 MED ORDER — FENTANYL CITRATE (PF) 100 MCG/2ML IJ SOLN
INTRAMUSCULAR | Status: DC | PRN
  Administered 2018-07-31 (×2): 25 ug via INTRAVENOUS
  Administered 2018-07-31: 50 ug via INTRAVENOUS

## 2018-07-31 SURGICAL SUPPLY — 6 items
CATH BEACON 5 .038 100 VERT TP (CATHETERS) ×3 IMPLANT
NEEDLE ENTRY 21GA 7CM ECHOTIP (NEEDLE) ×3 IMPLANT
PACK ANGIOGRAPHY (CUSTOM PROCEDURE TRAY) ×3 IMPLANT
SET INTRO CAPELLA COAXIAL (SET/KITS/TRAYS/PACK) ×3 IMPLANT
SET VENACAVA FILTER RETRIEVAL (MISCELLANEOUS) ×3 IMPLANT
WIRE J 3MM .035X145CM (WIRE) ×3 IMPLANT

## 2018-07-31 NOTE — H&P (Signed)
[facility] History & Physical Update  The patient was interviewed and re-examined.  The patient's previous History and Physical has been reviewed and is unchanged.  There is no change in the plan of care. We plan to proceed with the scheduled procedure.  Levora Dredge, MD  07/31/2018, 8:08 AM

## 2018-07-31 NOTE — Op Note (Signed)
  OPERATIVE NOTE   PRE-OPERATIVE DIAGNOSIS: DVT   POST-OPERATIVE DIAGNOSIS: Same  PROCEDURE: 1. Retrieval of IVC Filter 2. Inferior Vena Cavagram  SURGEON: Renford Dills, M.D.  ANESTHESIA:  Conscious sedation was administered under my direct supervision by the interventional radiology RN. IV Versed plus fentanyl were utilized. Continuous ECG, pulse oximetry and blood pressure was monitored throughout the entire procedure. Conscious sedation was for a total of 25 minutes.  ESTIMATED BLOOD LOSS: Minimal cc  FINDING(S):inferior vena cava is widely patent filter is in place in good position. Filter is removed without incident  SPECIMEN(S):  IVC filter intact  INDICATIONS:   Frank Rivera is a 58 y.o. male who presents with DVT and PE. The patient has now tolerated anticoagulation for several months. Therefore, the IVC filter is recommended to be removed. The risks and benefits were reviewed with the patient all questions were answered and they agreed to proceed with IVC filter retrieval. Oral anticoagulation will be continued.  DESCRIPTION: After obtaining full informed written consent, the patient was brought back to the Special Procedure Suite and placed in the supine position.  The patient received IV antibiotics prior to induction.  After obtaining adequate sedation, the patient was prepped and draped in the standard fashion and appropriate time out is called.     Ultrasound was placed in a sterile sleeve.The right neck was then imaged with ultrasound.   Jugular vein was identified it is echolucent and homogeneous indicating patency. 1% lidocaine is then infiltrated under ultrasound visualization and subsequently a Seldinger needle is inserted under real-time ultrasound guidance.  J-wire is then advanced into the inferior vena cava under fluoroscopic guidance. With the tip of the sheath at the confluence of the iliac veins inferior vena caval imaging is performed.  After review of  the image the sheath is repositioned to above the filter and the snares introduced. Snares opened and the hook is secured without difficulty. The filter is then collapsed within the sheath and removed without difficulty.  Sheath is removed by pressures held the patient tolerated the procedure well and there were no immediate complications.  Interpretation: inferior vena cava is widely patent filter is in place in good position. Filter is removed without incident.     COMPLICATIONS: None  CONDITION: [name], M.D. [facility] Office: [phone]   07/31/2018, 9:01 AM

## 2018-07-31 NOTE — Discharge Instructions (Signed)
Inferior Vena Cava Filter Removal, Care After This sheet gives you information about how to care for yourself after your procedure. Your health care provider may also give you more specific instructions. If you have problems or questions, contact your health care provider. What can I expect after the procedure? After your procedure, it is common to have: Mild pain in the area where the filter was removed. Mild bruising in the area where the filter was removed.  Follow these instructions at home: Removal  site care Follow instructions from your health care provider about how to take care of the site where a catheter was removed at your neck.. Make sure you: Wash your hands with soap and water before you change your bandage (dressing). If soap and water are not available, use hand sanitizer. Leave bandage on for 48hrs.. Check your removal site every day for signs of infection. Check for: More redness, swelling, or pain. More fluid or blood. Warmth. Pus or a bad smell. Keep the removal site clean and dry. You may shower tomorrow, do not scrub or rub over the dressing site. General instructions Take over-the-counter and prescription medicines only as told by your health care provider. Avoid heavy lifting or hard activities for 48 hours after the procedure or as told by your health care provider. Do not drive for 24 hours if you were given a a medicine to help you relax (sedative). Do not drive or use heavy machinery while taking prescription pain medicine. Do not go back to school or work until your health care provider approves. Keep all follow-up visits as told by your health care provider. This is important. Contact a health care provider if: You have more redness, swelling, or pain around your removal site. You have more fluid or blood coming from you removalr site. Your removal site feels warm to the touch. You have pus or a bad smell coming from your  removal site. You have a  fever. You are dizzy. You have nausea and vomiting. You develop a rash. Get help right away if: You develop chest pain, a cough, or difficulty breathing. You develop shortness of breath, feel faint, or pass out. You cough up blood. You have severe pain in your abdomen. You develop swelling and discoloration or pain in your legs. Your legs become pale and cold or blue. You develop weakness, difficulty moving your arms or legs, or balance problems. You develop problems with speech or vision. These symptoms may represent a serious problem that is an emergency. Do not wait to see if the symptoms will go away. Get medical help right away. Call your local emergency services (911 in the [place] Do not drive yourself to the hospital. Summary   

## 2018-08-13 ENCOUNTER — Ambulatory Visit (INDEPENDENT_AMBULATORY_CARE_PROVIDER_SITE_OTHER): Payer: PRIVATE HEALTH INSURANCE | Admitting: Vascular Surgery

## 2018-08-14 ENCOUNTER — Ambulatory Visit (INDEPENDENT_AMBULATORY_CARE_PROVIDER_SITE_OTHER): Payer: PRIVATE HEALTH INSURANCE | Admitting: Nurse Practitioner

## 2018-08-14 ENCOUNTER — Other Ambulatory Visit: Payer: Self-pay

## 2018-08-14 ENCOUNTER — Encounter (INDEPENDENT_AMBULATORY_CARE_PROVIDER_SITE_OTHER): Payer: Self-pay | Admitting: Nurse Practitioner

## 2018-08-14 VITALS — BP 185/110 | HR 94 | Resp 12 | Ht 68.0 in | Wt 280.0 lb

## 2018-08-14 DIAGNOSIS — I87009 Postthrombotic syndrome without complications of unspecified extremity: Secondary | ICD-10-CM | POA: Diagnosis not present

## 2018-08-14 DIAGNOSIS — M1712 Unilateral primary osteoarthritis, left knee: Secondary | ICD-10-CM

## 2018-08-14 DIAGNOSIS — Z720 Tobacco use: Secondary | ICD-10-CM | POA: Diagnosis not present

## 2018-08-20 NOTE — Progress Notes (Signed)
SUBJECTIVE:  Patient ID: Frank Rivera, male    DOB: [birth year], 58 y.o.   MRN: [id number] Chief Complaint  Patient presents with  . Follow-up    HPI  Frank Rivera is a 58 y.o. male that presents today for IVC filter removal follow up.  The patient denies any issues following the procedure.  However, he continues to have significant post phlebetic edema bilaterally.  He endorses wearing compression stockings with elevation daily but he still continues to have significant swelling.  He exercises which somewhat helps with swelling but overall it is lifestyle limiting at times.  He denies fever or chills.    Past Medical History:  Diagnosis Date  . Dyspnea   . Dyspnea on exertion   . Elevated lipids   . Hx of blood clots    legs  . Pulmonary emboli (HCC)   . Pulmonary embolism (HCC) 2011  . Pulmonary embolism (HCC)    2017    Past Surgical History:  Procedure Laterality Date  . APPENDECTOMY     as child  . IVC FILTER INSERTION Right 02/21/2018   Procedure: IVC FILTER INSERTION;  Surgeon: Renford Dills, MD;  Location: [facility];  Service: Cardiovascular;  Laterality: Right;  . IVC FILTER REMOVAL N/A 07/31/2018   Procedure: IVC FILTER REMOVAL;  Surgeon: Renford Dills, MD;  Location: [facility];  Service: Cardiovascular;  Laterality: N/A;  . TONSILLECTOMY AND ADENOIDECTOMY     as child  . TOTAL KNEE ARTHROPLASTY Bilateral 02/27/2018   Procedure: TOTAL KNEE BILATERAL;  Surgeon: Kennedy Bucker, MD;  Location: [facility] ORS;  Service: Orthopedics;  Laterality: Bilateral;    Social History   Socioeconomic History  . Marital status: Married    Spouse name: Not on file  . Number of children: Not on file  . Years of education: Not on file  . Highest education level: Not on file  Occupational History  . Not on file  Social Needs  . Financial resource strain: Not on file  . Food insecurity:    Worry: Not on file    Inability: Not on file  . Transportation  needs:    Medical: Not on file    Non-medical: Not on file  Tobacco Use  . Smoking status: Former Smoker    Packs/day: 1.00    Years: 25.00    Pack years: 25.00    Types: Cigarettes    Last attempt to quit: 11/17/2015    Years since quitting: 2.7  . Smokeless tobacco: Never Used  Substance and Sexual Activity  . Alcohol use: Yes    Comment: rare  . Drug use: No  . Sexual activity: Yes    Partners: Female  Lifestyle  . Physical activity:    Days per week: Not on file    Minutes per session: Not on file  . Stress: Not on file  Relationships  . Social connections:    Talks on phone: Not on file    Gets together: Not on file    Attends religious service: Not on file    Active member of club or organization: Not on file    Attends meetings of clubs or organizations: Not on file    Relationship status: Not on file  . Intimate partner violence:    Fear of current or ex partner: Not on file    Emotionally abused: Not on file    Physically abused: Not on file    Forced sexual  activity: Not on file  Other Topics Concern  . Not on file  Social History Narrative  . Not on file    Family History  Problem Relation Age of Onset  . Melanoma Father        face  . Colon cancer Paternal Uncle        died of colon ca - dx at age 5's  . Diabetes Neg Hx     Allergies  Allergen Reactions  . Sulfa Antibiotics Other (See Comments)    As a child-told he had reaction to sulfa drugs.     Review of Systems   Review of Systems: Negative Unless Checked Constitutional: Weight loss  Fever  Chills Cardiac: Chest pain    Atrial Fibrillation  Palpitations   Shortness of breath when laying flat   Shortness of breath with exertion. Shortness of breath at rest Vascular:  Pain in legs with walking   Pain in legs with standing Pain in legs when laying flat   Claudication    Pain in feet when laying flat    History of DVT   Phlebitis   Swelling in legs    Varicose veins   Non-healing ulcers Pulmonary:   Uses home oxygen   Productive cough   Hemoptysis   Wheeze  COPD   Asthma Neurologic:  Dizziness   Seizures  Blackouts History of stroke   History of TIA  Aphasia   Temporary Blindness   Weakness or numbness in arm   Weakness or numbness in leg Musculoskeletal:   Joint swelling   Joint pain   Low back pain   History of Knee Replacement Arthritis back Surgeries   Spinal Stenosis    Hematologic:  Easy bruising  Easy bleeding   Hypercoagulable state   Anemic Gastrointestinal:  Diarrhea   Vomiting  Gastroesophageal reflux/heartburn   Difficulty swallowing. Abdominal pain Genitourinary:  Chronic kidney disease   Difficult urination  Anuric   Blood in urine Frequent urination  Burning with urination   Hematuria Skin:  Rashes   Ulcers Wounds Psychological:  History of anxiety    History of major depression   Memory Difficulties      OBJECTIVE:   Physical Exam  BP (!) 185/110 (BP Location: Left Arm, Patient Position: Sitting, Cuff Size: Large)   Pulse 94   Resp 12   Ht  (1.727 m)   Wt 280 lb (127 kg)   BMI 42.57 kg/m   Gen: WD/WN, NAD Head: [place]/AT, No temporalis wasting.  Ear/Nose/Throat: Hearing grossly intact, nares w/o erythema or drainage Eyes: PER, EOMI, sclera nonicteric.  Neck: Supple, no masses.  No JVD.  Pulmonary:  Good air movement, no use of accessory muscles.  Cardiac: RRR Vascular: 3+ LE edema bilaterally  Vessel Right Left  Radial Palpable Palpable   Gastrointestinal: soft, non-distended. No guarding/no peritoneal signs.  Musculoskeletal: M/S 5/5 throughout.  No deformity or atrophy.  Neurologic: Pain and light touch intact in extremities.  Symmetrical.  Speech is fluent. Motor exam as listed above. Psychiatric: Judgment intact, Mood & affect appropriate for pt's clinical situation. Dermatologic: bilateral stasis dermatits No  Ulcers Noted.  No changes consistent with cellulitis. Lymph : No Cervical lymphadenopathy, bilateral dermal thickening       ASSESSMENT AND PLAN:  1. 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  The patient is also a good candidate for a lymph  pump we will reach out to the company to see what can be done to try to facilitate a lymphedema pump for the patient.  Previously he had issues with payment.  We will follow-up with the patient in 4 weeks in order to determine if Unna wrap therapy has been adequate and if he can be removed. 2. Primary osteoarthritis of left knee Continue NSAID medications as already ordered, these medications have been reviewed and there are no changes at this time.  Continued activity and therapy was stressed.   3. Tobacco use Smoking cessation was discussed, 3-10 minutes spent on this topic specifically    Current Outpatient Medications on File Prior to Visit  Medication Sig Dispense Refill  . diphenhydramine-acetaminophen (TYLENOL PM) 25-500 MG TABS tablet Take 1-2 tablets by mouth at bedtime.     . methocarbamol (ROBAXIN) 500 MG tablet Take 1 tablet (500 mg total) by mouth every 6 (six) hours as needed for muscle spasms. 30 tablet 0  . oxyCODONE (OXY IR/ROXICODONE) 5 MG immediate release tablet Take 5 mg by mouth 2 (two) times a day.    . simvastatin (ZOCOR) 40 MG tablet Take 60 mg by mouth at bedtime.     Marland Kitchen. warfarin (COUMADIN) 5 MG tablet Take 5 mg by mouth every Monday, Wednesday, and Friday.     . metoprolol tartrate (LOPRESSOR) 25 MG tablet Take 1 tablet (25 mg total) by mouth 2 (two) times daily. (Patient not taking: Reported on 07/19/2018) 60 tablet 0   No current facility-administered medications on file prior to visit.     There are no Patient Instructions on file for this visit. Return in about 4 weeks (around 09/11/2018) for Unna Check.    Georgiana SpinnerFallon E Dung Prien, NP  This note was completed with Office managerDragon Dictation.  Any errors are purely unintentional.

## 2018-08-21 ENCOUNTER — Encounter (INDEPENDENT_AMBULATORY_CARE_PROVIDER_SITE_OTHER): Payer: Self-pay

## 2018-08-21 ENCOUNTER — Ambulatory Visit (INDEPENDENT_AMBULATORY_CARE_PROVIDER_SITE_OTHER): Payer: PRIVATE HEALTH INSURANCE | Admitting: Nurse Practitioner

## 2018-08-21 ENCOUNTER — Other Ambulatory Visit: Payer: Self-pay

## 2018-08-21 DIAGNOSIS — L97929 Non-pressure chronic ulcer of unspecified part of left lower leg with unspecified severity: Secondary | ICD-10-CM

## 2018-08-21 DIAGNOSIS — L97919 Non-pressure chronic ulcer of unspecified part of right lower leg with unspecified severity: Secondary | ICD-10-CM

## 2018-08-21 DIAGNOSIS — I890 Lymphedema, not elsewhere classified: Secondary | ICD-10-CM | POA: Diagnosis not present

## 2018-08-21 NOTE — Progress Notes (Signed)
History of Present Illness  There is no documented history at this time  Assessments & Plan   There are no diagnoses linked to this encounter.    Additional instructions  Subjective:  Patient presents with venous ulcer of the Bilateral lower extremity.    Procedure:  3 layer unna wrap was placed Bilateral lower extremity.   Plan:   Follow up in one week.  

## 2018-08-28 ENCOUNTER — Encounter (INDEPENDENT_AMBULATORY_CARE_PROVIDER_SITE_OTHER): Payer: Self-pay | Admitting: Nurse Practitioner

## 2018-08-28 ENCOUNTER — Other Ambulatory Visit: Payer: Self-pay

## 2018-08-28 ENCOUNTER — Ambulatory Visit (INDEPENDENT_AMBULATORY_CARE_PROVIDER_SITE_OTHER): Payer: PRIVATE HEALTH INSURANCE | Admitting: Nurse Practitioner

## 2018-08-28 VITALS — BP 184/110 | HR 87 | Resp 16 | Ht 68.0 in | Wt 276.0 lb

## 2018-08-28 DIAGNOSIS — I890 Lymphedema, not elsewhere classified: Secondary | ICD-10-CM | POA: Diagnosis not present

## 2018-08-28 DIAGNOSIS — L97919 Non-pressure chronic ulcer of unspecified part of right lower leg with unspecified severity: Secondary | ICD-10-CM | POA: Diagnosis not present

## 2018-08-28 DIAGNOSIS — L97929 Non-pressure chronic ulcer of unspecified part of left lower leg with unspecified severity: Secondary | ICD-10-CM | POA: Diagnosis not present

## 2018-08-28 NOTE — Progress Notes (Signed)
History of Present Illness  There is no documented history at this time  Assessments & Plan   There are no diagnoses linked to this encounter.    Additional instructions  Subjective:  Patient presents with venous ulcer of the Bilateral lower extremity.    Procedure:  3 layer unna wrap was placed Bilateral lower extremity.   Plan:   Follow up in one week.  

## 2018-09-04 ENCOUNTER — Other Ambulatory Visit: Payer: Self-pay

## 2018-09-04 ENCOUNTER — Encounter (INDEPENDENT_AMBULATORY_CARE_PROVIDER_SITE_OTHER): Payer: Self-pay | Admitting: Nurse Practitioner

## 2018-09-04 ENCOUNTER — Ambulatory Visit (INDEPENDENT_AMBULATORY_CARE_PROVIDER_SITE_OTHER): Payer: PRIVATE HEALTH INSURANCE | Admitting: Nurse Practitioner

## 2018-09-04 VITALS — BP 194/106 | HR 87 | Resp 14 | Ht 68.0 in | Wt 274.0 lb

## 2018-09-04 DIAGNOSIS — L97929 Non-pressure chronic ulcer of unspecified part of left lower leg with unspecified severity: Secondary | ICD-10-CM

## 2018-09-04 DIAGNOSIS — I890 Lymphedema, not elsewhere classified: Secondary | ICD-10-CM

## 2018-09-04 DIAGNOSIS — L97919 Non-pressure chronic ulcer of unspecified part of right lower leg with unspecified severity: Secondary | ICD-10-CM | POA: Diagnosis not present

## 2018-09-04 NOTE — Progress Notes (Signed)
History of Present Illness  There is no documented history at this time  Assessments & Plan   There are no diagnoses linked to this encounter.    Additional instructions  Subjective:  Patient presents with venous ulcer of the Bilateral lower extremity.    Procedure:  3 layer unna wrap was placed Bilateral lower extremity.   Plan:   Follow up in one week.  

## 2018-09-11 ENCOUNTER — Encounter (INDEPENDENT_AMBULATORY_CARE_PROVIDER_SITE_OTHER): Payer: Self-pay | Admitting: Nurse Practitioner

## 2018-09-11 ENCOUNTER — Ambulatory Visit (INDEPENDENT_AMBULATORY_CARE_PROVIDER_SITE_OTHER): Payer: PRIVATE HEALTH INSURANCE | Admitting: Nurse Practitioner

## 2018-09-11 ENCOUNTER — Other Ambulatory Visit: Payer: Self-pay

## 2018-09-11 VITALS — BP 187/110 | HR 89 | Resp 12 | Ht 68.0 in | Wt 276.0 lb

## 2018-09-11 DIAGNOSIS — Z79899 Other long term (current) drug therapy: Secondary | ICD-10-CM | POA: Diagnosis not present

## 2018-09-11 DIAGNOSIS — I890 Lymphedema, not elsewhere classified: Secondary | ICD-10-CM | POA: Diagnosis not present

## 2018-09-11 DIAGNOSIS — E7800 Pure hypercholesterolemia, unspecified: Secondary | ICD-10-CM | POA: Diagnosis not present

## 2018-09-11 DIAGNOSIS — M1712 Unilateral primary osteoarthritis, left knee: Secondary | ICD-10-CM | POA: Diagnosis not present

## 2018-09-11 NOTE — Progress Notes (Signed)
SUBJECTIVE:  Patient ID: Frank Rivera Bertino, male    DOB: 06/27/1960, 58 y.o.   MRN: [id number] Chief Complaint  Patient presents with  . Follow-up    HPI  Frank Rivera Imm is Rivera 58 y.o. male that follows up today for evaluation of his lower extremities after 4 weeks of [place] wraps.  Today the legs appear much better with decreased swelling.  The patient reports that he removed his wraps on Saturday after getting dirty wound cutting grass and since that time he has been wearing medical grade 1 compression stockings without difficulty.  He states that wearing the has his legs feeling much better on Rivera daily basis.  He states his only complaint is that he is having some numbness and tingling in his right lower extremity.  However, he denies any claudication-like symptoms rest pain or ulcerations.  He denies any chest pain or shortness of breath.  He denies any TIA-like symptoms.  He denies any fever, chills, nausea, vomiting or diarrhea.  Past Medical History:  Diagnosis Date  . Dyspnea   . Dyspnea on exertion   . Elevated lipids   . Hx of blood clots    legs  . Pulmonary emboli (HCC)   . Pulmonary embolism (HCC) 2011  . Pulmonary embolism (HCC)    2017    Past Surgical History:  Procedure Laterality Date  . APPENDECTOMY     as child  . IVC FILTER INSERTION Right 02/21/2018   Procedure: IVC FILTER INSERTION;  Surgeon: Renford DillsSchnier, Gregory G, MD;  Location: [facility];  Service: Cardiovascular;  Laterality: Right;  . IVC FILTER REMOVAL N/Rivera 07/31/2018   Procedure: IVC FILTER REMOVAL;  Surgeon: Renford DillsSchnier, Gregory G, MD;  Location: [facility];  Service: Cardiovascular;  Laterality: N/Rivera;  . TONSILLECTOMY AND ADENOIDECTOMY     as child  . TOTAL KNEE ARTHROPLASTY Bilateral 02/27/2018   Procedure: TOTAL KNEE BILATERAL;  Surgeon: Kennedy BuckerMenz, Michael, MD;  Location: [facility];  Service: Orthopedics;  Laterality: Bilateral;    Social History   Socioeconomic History  . Marital status: Married   Spouse name: Not on file  . Number of children: Not on file  . Years of education: Not on file  . Highest education level: Not on file  Occupational History  . Not on file  Social Needs  . Financial resource strain: Not on file  . Food insecurity    Worry: Not on file    Inability: Not on file  . Transportation needs    Medical: Not on file    Non-medical: Not on file  Tobacco Use  . Smoking status: Former Smoker    Packs/day: 1.00    Years: 25.00    Pack years: 25.00    Types: Cigarettes    Quit date: 11/17/2015    Years since quitting: 2.8  . Smokeless tobacco: Never Used  Substance and Sexual Activity  . Alcohol use: Yes    Comment: rare  . Drug use: No  . Sexual activity: Yes    Partners: Female  Lifestyle  . Physical activity    Days per week: Not on file    Minutes per session: Not on file  . Stress: Not on file  Relationships  . Social Musicianconnections    Talks on phone: Not on file    Gets together: Not on file    Attends religious service: Not on file    Active member of club or organization: Not on file  Attends meetings of clubs or organizations: Not on file    Relationship status: Not on file  . Intimate partner violence    Fear of current or ex partner: Not on file    Emotionally abused: Not on file    Physically abused: Not on file    Forced sexual activity: Not on file  Other Topics Concern  . Not on file  Social History Narrative  . Not on file    Family History  Problem Relation Age of Onset  . Melanoma Father        face  . Colon cancer Paternal Uncle        died of colon ca - dx at age 56's  . Diabetes Neg Hx     Allergies  Allergen Reactions  . Sulfa Antibiotics Other (See Comments)    As Rivera child-told he had reaction to sulfa drugs.     Review of Systems   Review of Systems: Negative Unless Checked Constitutional: [] Weight loss  [] Fever  [] Chills Cardiac: [] Chest pain   []  Atrial Fibrillation  [] Palpitations   [] Shortness of  breath when laying flat   [x] Shortness of breath with exertion. [] Shortness of breath at rest Vascular:  [] Pain in legs with walking   [] Pain in legs with standing [] Pain in legs when laying flat   [] Claudication    [] Pain in feet when laying flat    [] History of DVT   [] Phlebitis   [x] Swelling in legs   [x] Varicose veins   [] Non-healing ulcers Pulmonary:   [] Uses home oxygen   [] Productive cough   [] Hemoptysis   [] Wheeze  [] COPD   [] Asthma Neurologic:  [] Dizziness   [] Seizures  [] Blackouts [] History of stroke   [] History of TIA  [] Aphasia   [] Temporary Blindness   [] Weakness or numbness in arm   [x] Weakness or numbness in leg Musculoskeletal:   [] Joint swelling   [] Joint pain   [] Low back pain  []  History of Knee Replacement [x] Arthritis [] back Surgeries  []  Spinal Stenosis    Hematologic:  [] Easy bruising  [] Easy bleeding   [] Hypercoagulable state   [] Anemic Gastrointestinal:  [] Diarrhea   [] Vomiting  [] Gastroesophageal reflux/heartburn   [] Difficulty swallowing. [] Abdominal pain Genitourinary:  [] Chronic kidney disease   [] Difficult urination  [] Anuric   [] Blood in urine [] Frequent urination  [] Burning with urination   [] Hematuria Skin:  [] Rashes   [] Ulcers [] Wounds Psychological:  [] History of anxiety   []  History of major depression  []  Memory Difficulties      OBJECTIVE:   Physical Exam  BP (!) 187/110 (BP Location: Left Arm, Patient Position: Sitting, Cuff Size: Large)   Pulse 89   Resp 12   Ht 5\' 8"  (1.727 m)   Wt 276 lb (125.2 kg)   BMI 41.97 kg/m   Gen: WD/WN, NAD Head: [place]/AT, No temporalis wasting.  Ear/Nose/Throat: Hearing grossly intact, nares w/o erythema or drainage Eyes: PER, EOMI, sclera nonicteric.  Neck: Supple, no masses.  No JVD.  Pulmonary:  Good air movement, no use of accessory muscles.  Cardiac: RRR Vascular:  2+ soft edema bilaterally, unable to palpate pedal pulses due to body habitus Vessel Right Left  Radial Palpable Palpable   Gastrointestinal: soft,  non-distended. No guarding/no peritoneal signs.  Musculoskeletal: M/S 5/5 throughout.  No deformity or atrophy.  Neurologic: Pain and light touch intact in extremities.  Symmetrical.  Speech is fluent. Motor exam as listed above. Psychiatric: Judgment intact, Mood & affect appropriate for pt's clinical situation. Dermatologic:  Bilateral stasis  dermatitis.  No Ulcers Noted.  No changes consistent with cellulitis. Lymph : No Cervical lymphadenopathy, no lichenification or skin changes of chronic lymphedema.       ASSESSMENT AND PLAN:  1. Lymphedema Patient's swelling is much more under control and he is able to transition into medical grade 1 compression stockings, therefore we will remove him from [place] wraps.  The patient is advised to continue to wear his medical grade 1 compression stockings on Rivera daily basis, from morning to evening.  He is also encouraged to elevate his lower extremities as much as possible as well as exercise.  The patient's complaint of numbness and tingling in his right lower extremity may be due to some postphlebitic symptoms however based on his symptoms I do not feel that they are entirely vascular in nature.  I have recommended for the patient follow-up with his primary care for further work-up of right lower extremity numbness and tingling.  I have also offered to do ABIs if there are no other causes found.  Patient will follow-up in 6 months.  2. Primary osteoarthritis of left knee Continue NSAID medications as already ordered, these medications have been reviewed and there are no changes at this time.  Continued activity and therapy was stressed.   3. Hypercholesteremia Continue statin as ordered and reviewed, no changes at this time    Current Outpatient Medications on File Prior to Visit  Medication Sig Dispense Refill  . diphenhydramine-acetaminophen (TYLENOL PM) 25-500 MG TABS tablet Take 1-2 tablets by mouth at bedtime.     . methocarbamol (ROBAXIN)  500 MG tablet Take 1 tablet (500 mg total) by mouth every 6 (six) hours as needed for muscle spasms. 30 tablet 0  . oxyCODONE (OXY IR/ROXICODONE) 5 MG immediate release tablet Take 5 mg by mouth 2 (two) times Rivera day.    . simvastatin (ZOCOR) 40 MG tablet Take 60 mg by mouth at bedtime.     Marland Kitchen warfarin (COUMADIN) 5 MG tablet Take 5 mg by mouth every Monday, Wednesday, and Friday.     . metoprolol tartrate (LOPRESSOR) 25 MG tablet Take 1 tablet (25 mg total) by mouth 2 (two) times daily. (Patient not taking: Reported on 07/19/2018) 60 tablet 0   No current facility-administered medications on file prior to visit.     There are no Patient Instructions on file for this visit. No follow-ups on file.   Kris Hartmann, NP  This note was completed with Sales executive.  Any errors are purely unintentional.

## 2018-09-17 DIAGNOSIS — R0602 Shortness of breath: Secondary | ICD-10-CM | POA: Insufficient documentation

## 2019-03-14 ENCOUNTER — Other Ambulatory Visit: Payer: Self-pay

## 2019-03-14 ENCOUNTER — Encounter (INDEPENDENT_AMBULATORY_CARE_PROVIDER_SITE_OTHER): Payer: Self-pay

## 2019-03-14 ENCOUNTER — Encounter (INDEPENDENT_AMBULATORY_CARE_PROVIDER_SITE_OTHER): Payer: Self-pay | Admitting: Nurse Practitioner

## 2019-03-14 ENCOUNTER — Ambulatory Visit (INDEPENDENT_AMBULATORY_CARE_PROVIDER_SITE_OTHER): Payer: PRIVATE HEALTH INSURANCE | Admitting: Nurse Practitioner

## 2019-03-14 VITALS — BP 138/84 | HR 77 | Resp 16 | Wt 277.8 lb

## 2019-03-14 DIAGNOSIS — I83893 Varicose veins of bilateral lower extremities with other complications: Secondary | ICD-10-CM | POA: Diagnosis not present

## 2019-03-14 DIAGNOSIS — E7800 Pure hypercholesterolemia, unspecified: Secondary | ICD-10-CM | POA: Diagnosis not present

## 2019-03-14 DIAGNOSIS — I890 Lymphedema, not elsewhere classified: Secondary | ICD-10-CM

## 2019-03-14 NOTE — Progress Notes (Signed)
SUBJECTIVE:  Patient ID: Frank Rivera, male    DOB: 01/29/1961, 58 y.o.   MRN: [id number] Chief Complaint  Patient presents with  . Follow-up    50month follow up    HPI  Frank Rivera is a 58 y.o. male the presents today for follow-up of leg swelling.  The patient received his lymphedema pump approximately 6 months ago and states that it has been a very positive factor in his care.  The patient began to struggle with leg swelling following a history of several DVTs.  He states that with wearing his medical grade 1 compression stockings in addition to his lymphedema pump his leg swelling has been much better under control however he continues to have some issues with tiredness, heaviness and leg pain.  The patient relates burning and stinging which worsened steadily throughout the course of the day, particularly with standing. The patient also notes an aching and throbbing pain over the varicosities, particularly with prolonged dependent positions. The symptoms are significantly improved with elevation.  The patient also notes that during hot weather the symptoms are greatly intensified. The patient states the pain from the varicose veins interferes with work, daily exercise, shopping and household maintenance. At this point, the symptoms are persistent and severe enough that they're having a negative impact on lifestyle and are interfering with daily activities.  There is history of DVT, PE or superficial thrombophlebitis. There is no history of ulceration or hemorrhage. The patient denies a significant family history of varicose veins.  The patient has worn graduated compression for well over a year in addition to using over-the-counter analgesics. There is no history of prior surgical intervention or sclerotherapy.     Past Medical History:  Diagnosis Date  . Dyspnea   . Dyspnea on exertion   . Elevated lipids   . Hx of blood clots    legs  . Pulmonary emboli (HCC)   . Pulmonary  embolism (HCC) 2011  . Pulmonary embolism (HCC)    2017    Past Surgical History:  Procedure Laterality Date  . APPENDECTOMY     as child  . IVC FILTER INSERTION Right 02/21/2018   Procedure: IVC FILTER INSERTION;  Surgeon: Renford DillsSchnier, Gregory G, MD;  Location: [facility];  Service: Cardiovascular;  Laterality: Right;  . IVC FILTER REMOVAL N/A 07/31/2018   Procedure: IVC FILTER REMOVAL;  Surgeon: Renford DillsSchnier, Gregory G, MD;  Location: [facility];  Service: Cardiovascular;  Laterality: N/A;  . TONSILLECTOMY AND ADENOIDECTOMY     as child  . TOTAL KNEE ARTHROPLASTY Bilateral 02/27/2018   Procedure: TOTAL KNEE BILATERAL;  Surgeon: Kennedy BuckerMenz, Michael, MD;  Location: [facility];  Service: Orthopedics;  Laterality: Bilateral;    Social History   Socioeconomic History  . Marital status: Married    Spouse name: Not on file  . Number of children: Not on file  . Years of education: Not on file  . Highest education level: Not on file  Occupational History  . Not on file  Tobacco Use  . Smoking status: Former Smoker    Packs/day: 1.00    Years: 25.00    Pack years: 25.00    Types: Cigarettes    Quit date: 11/17/2015    Years since quitting: 3.3  . Smokeless tobacco: Never Used  Substance and Sexual Activity  . Alcohol use: Yes    Comment: rare  . Drug use: No  . Sexual activity: Yes    Partners: Female  Other Topics Concern  . Not on file  Social History Narrative  . Not on file   Social Determinants of Health   Financial Resource Strain:   . Difficulty of Paying Living Expenses: Not on file  Food Insecurity:   . Worried About Programme researcher, broadcasting/film/video in the Last Year: Not on file  . Ran Out of Food in the Last Year: Not on file  Transportation Needs:   . Lack of Transportation (Medical): Not on file  . Lack of Transportation (Non-Medical): Not on file  Physical Activity:   . Days of Exercise per Week: Not on file  . Minutes of Exercise per Session: Not on file  Stress:    . Feeling of Stress : Not on file  Social Connections:   . Frequency of Communication with Friends and Family: Not on file  . Frequency of Social Gatherings with Friends and Family: Not on file  . Attends Religious Services: Not on file  . Active Member of Clubs or Organizations: Not on file  . Attends Banker Meetings: Not on file  . Marital Status: Not on file  Intimate Partner Violence:   . Fear of Current or Ex-Partner: Not on file  . Emotionally Abused: Not on file  . Physically Abused: Not on file  . Sexually Abused: Not on file    Family History  Problem Relation Age of Onset  . Melanoma Father        face  . Colon cancer Paternal Uncle        died of colon ca - dx at age 36's  . Diabetes Neg Hx     Allergies  Allergen Reactions  . Sulfa Antibiotics Other (See Comments)    As a child-told he had reaction to sulfa drugs.     Review of Systems   Review of Systems: Negative Unless Checked Constitutional: [] Weight loss  [] Fever  [] Chills Cardiac: [] Chest pain   []  Atrial Fibrillation  [] Palpitations   [] Shortness of breath when laying flat   [x] Shortness of breath with exertion. [] Shortness of breath at rest Vascular:  [] Pain in legs with walking   [] Pain in legs with standing [] Pain in legs when laying flat   [] Claudication    [] Pain in feet when laying flat    [] History of DVT   [] Phlebitis   [x] Swelling in legs   [x] Varicose veins   [] Non-healing ulcers Pulmonary:   [] Uses home oxygen   [] Productive cough   [] Hemoptysis   [] Wheeze  [] COPD   [] Asthma Neurologic:  [] Dizziness   [] Seizures  [] Blackouts [] History of stroke   [] History of TIA  [] Aphasia   [] Temporary Blindness   [] Weakness or numbness in arm   [x] Weakness or numbness in leg Musculoskeletal:   [] Joint swelling   [] Joint pain   [] Low back pain  []  History of Knee Replacement [x] Arthritis [] back Surgeries  []  Spinal Stenosis    Hematologic:  [] Easy bruising  [] Easy bleeding   [] Hypercoagulable  state   [] Anemic Gastrointestinal:  [] Diarrhea   [] Vomiting  [] Gastroesophageal reflux/heartburn   [] Difficulty swallowing. [] Abdominal pain Genitourinary:  [] Chronic kidney disease   [] Difficult urination  [] Anuric   [] Blood in urine [] Frequent urination  [] Burning with urination   [] Hematuria Skin:  [] Rashes   [] Ulcers [] Wounds Psychological:  [] History of anxiety   []  History of major depression  []  Memory Difficulties      OBJECTIVE:   Physical Exam  BP 138/84 (BP Location: Right Arm)   Pulse 77  Resp 16   Wt 277 lb 12.8 oz (126 kg)   BMI 42.24 kg/m   Gen: WD/WN, NAD Head: [place]/AT, No temporalis wasting.  Ear/Nose/Throat: Hearing grossly intact, nares w/o erythema or drainage Eyes: PER, EOMI, sclera nonicteric.  Neck: Supple, no masses.  No JVD.  Pulmonary:  Good air movement, no use of accessory muscles.  Cardiac: RRR Vascular: scattered varicosities present bilaterally.  Mild venous stasis changes to the legs bilaterally.  2+ soft edema  Vessel Right Left  Radial Palpable Palpable  Dorsalis Pedis Palpable Palpable  Posterior Tibial Palpable Palpable   Gastrointestinal: soft, non-distended. No guarding/no peritoneal signs.  Musculoskeletal: M/S 5/5 throughout.  No deformity or atrophy.  Neurologic: Pain and light touch intact in extremities.  Symmetrical.  Speech is fluent. Motor exam as listed above. Psychiatric: Judgment intact, Mood & affect appropriate for pt's clinical situation. Dermatologic: No Venous rashes. No Ulcers Noted.  No changes consistent with cellulitis. Lymph : No Cervical lymphadenopathy, no lichenification or skin changes of chronic lymphedema.       ASSESSMENT AND PLAN:  1. Varicose veins of bilateral lower extremities with other complications  Recommend:  The patient has large symptomatic varicose veins that are painful and associated with swelling.  I have had a long discussion with the patient regarding  varicose veins and why they cause  symptoms.  Patient will continue wearing graduated compression stockings class 1 on a daily basis, beginning first thing in the morning and removing them in the evening. The patient is instructed specifically not to sleep in the stockings.    The patient  will also continue using over-the-counter analgesics such as Motrin 600 mg po TID to help control the symptoms.    In addition, behavioral modification including elevation during the day will be continued    An  ultrasound of the venous system will be obtained.   Further plans will be based on the ultrasound results and whether conservative therapies are successful at eliminating the pain and swelling.   2. Lymphedema No surgery or intervention at this point in time.    I have reviewed my discussion with the patient regarding venous insufficiency and secondary lymph edema and why it  causes symptoms. I have discussed with the patient the chronic skin changes that accompany these problems and the long term sequela such as ulceration and infection.  Patient will continue wearing graduated compression stockings class 1 (20-30 mmHg) on a daily basis a prescription was given to the patient to keep this updated. The patient will  put the stockings on first thing in the morning and removing them in the evening. The patient is instructed specifically not to sleep in the stockings.  In addition, behavioral modification including elevation during the day will be continued.  Diet and salt restriction was also discussed.    The patient can be assessed for a Lymph Pump at that time.  However, at this time the patient states they are satisfied with the control compression and elevation is yielding.    3. Hypercholesteremia Continue statin as ordered and reviewed, no changes at this time     Current Outpatient Medications on File Prior to Visit  Medication Sig Dispense Refill  . diphenhydramine-acetaminophen (TYLENOL PM) 25-500 MG TABS tablet Take 1-2  tablets by mouth at bedtime.     . methocarbamol (ROBAXIN) 500 MG tablet Take 1 tablet (500 mg total) by mouth every 6 (six) hours as needed for muscle spasms. 30 tablet 0  . oxyCODONE (  OXY IR/ROXICODONE) 5 MG immediate release tablet Take 5 mg by mouth 2 (two) times a day.    . simvastatin (ZOCOR) 40 MG tablet Take 60 mg by mouth at bedtime.     . metoprolol tartrate (LOPRESSOR) 25 MG tablet Take 1 tablet (25 mg total) by mouth 2 (two) times daily. (Patient not taking: Reported on 03/14/2019) 60 tablet 0  . warfarin (COUMADIN) 5 MG tablet Take 5 mg by mouth every Monday, Wednesday, and Friday.      No current facility-administered medications on file prior to visit.    There are no Patient Instructions on file for this visit. No follow-ups on file.   Kris Hartmann, NP  This note was completed with Sales executive.  Any errors are purely unintentional.

## 2019-03-18 ENCOUNTER — Other Ambulatory Visit (INDEPENDENT_AMBULATORY_CARE_PROVIDER_SITE_OTHER): Payer: Self-pay | Admitting: Nurse Practitioner

## 2019-03-18 DIAGNOSIS — I83893 Varicose veins of bilateral lower extremities with other complications: Secondary | ICD-10-CM

## 2019-03-25 ENCOUNTER — Encounter (INDEPENDENT_AMBULATORY_CARE_PROVIDER_SITE_OTHER): Payer: Self-pay | Admitting: Nurse Practitioner

## 2019-03-25 ENCOUNTER — Ambulatory Visit (INDEPENDENT_AMBULATORY_CARE_PROVIDER_SITE_OTHER): Payer: PRIVATE HEALTH INSURANCE | Admitting: Nurse Practitioner

## 2019-03-25 ENCOUNTER — Other Ambulatory Visit: Payer: Self-pay

## 2019-03-25 ENCOUNTER — Ambulatory Visit (INDEPENDENT_AMBULATORY_CARE_PROVIDER_SITE_OTHER): Payer: PRIVATE HEALTH INSURANCE

## 2019-03-25 VITALS — BP 151/80 | HR 79 | Resp 16 | Wt 280.0 lb

## 2019-03-25 DIAGNOSIS — I83893 Varicose veins of bilateral lower extremities with other complications: Secondary | ICD-10-CM

## 2019-03-25 DIAGNOSIS — M1712 Unilateral primary osteoarthritis, left knee: Secondary | ICD-10-CM | POA: Diagnosis not present

## 2019-03-25 DIAGNOSIS — E7800 Pure hypercholesterolemia, unspecified: Secondary | ICD-10-CM | POA: Diagnosis not present

## 2019-04-01 ENCOUNTER — Encounter (INDEPENDENT_AMBULATORY_CARE_PROVIDER_SITE_OTHER): Payer: Self-pay | Admitting: Nurse Practitioner

## 2019-04-01 NOTE — Progress Notes (Signed)
SUBJECTIVE:  Patient ID: Frank Rivera, male    DOB: 02-01-1961, 59 y.o.   MRN: [id number] Chief Complaint  Patient presents with  . Follow-up    ultrasound follow up    HPI  Frank Rivera is a 59 y.o. male The patient returns for followup evaluation  after the initial visit. The patient continues to have pain in the lower extremities with dependency. The pain is lessened with elevation. Graduated compression stockings, Class I (20-30 mmHg), have been worn but the stockings do not eliminate the leg pain. Over-the-counter analgesics do not improve the symptoms. The degree of discomfort continues to interfere with daily activities. The patient notes the pain in the legs is causing problems with daily exercise, at the workplace and even with household activities and maintenance such as standing in the kitchen preparing meals and doing dishes. The patient has a history of multiple thrombotic events following orthopedic surgeries.  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  The bilateral lower extremities have partial compressibility with the exception of the popliteal vein bilaterally.  The patient also has evidence of chronic superficial vein thrombosis bilaterally.  The right has it in the small saphenous vein whereas the left has it in the great saphenous vein has small saphenous vein.  There is no evidence of acute changes currently.  There is also reflux in the bilateral deep systems as well as the bilateral great saphenous veins.  Past Medical History:  Diagnosis Date  . Dyspnea   . Dyspnea on exertion   . Elevated lipids   . Hx of blood clots    legs  . Pulmonary emboli (HCC)   . Pulmonary embolism (HCC) 2011  . Pulmonary embolism (HCC)    2017    Past Surgical History:  Procedure Laterality Date  . APPENDECTOMY     as child  . IVC FILTER INSERTION Right 02/21/2018   Procedure: IVC FILTER INSERTION;  Surgeon: Renford Dills,  MD;  Location: [facility];  Service: Cardiovascular;  Laterality: Right;  . IVC FILTER REMOVAL N/A 07/31/2018   Procedure: IVC FILTER REMOVAL;  Surgeon: Renford Dills, MD;  Location: [facility];  Service: Cardiovascular;  Laterality: N/A;  . TONSILLECTOMY AND ADENOIDECTOMY     as child  . TOTAL KNEE ARTHROPLASTY Bilateral 02/27/2018   Procedure: TOTAL KNEE BILATERAL;  Surgeon: Kennedy Bucker, MD;  Location: [facility];  Service: Orthopedics;  Laterality: Bilateral;    Social History   Socioeconomic History  . Marital status: Married    Spouse name: Not on file  . Number of children: Not on file  . Years of education: Not on file  . Highest education level: Not on file  Occupational History  . Not on file  Tobacco Use  . Smoking status: Former Smoker    Packs/day: 1.00    Years: 25.00    Pack years: 25.00    Types: Cigarettes    Quit date: 11/17/2015    Years since quitting: 3.3  . Smokeless tobacco: Never Used  Substance and Sexual Activity  . Alcohol use: Yes    Comment: rare  . Drug use: No  . Sexual activity: Yes    Partners: Female  Other Topics Concern  . Not on file  Social History Narrative  . Not on file   Social Determinants of Health   Financial Resource Strain:   . Difficulty of Paying Living Expenses: Not on  file  Food Insecurity:   . Worried About Charity fundraiser in the Last Year: Not on file  . Ran Out of Food in the Last Year: Not on file  Transportation Needs:   . Lack of Transportation (Medical): Not on file  . Lack of Transportation (Non-Medical): Not on file  Physical Activity:   . Days of Exercise per Week: Not on file  . Minutes of Exercise per Session: Not on file  Stress:   . Feeling of Stress : Not on file  Social Connections:   . Frequency of Communication with Friends and Family: Not on file  . Frequency of Social Gatherings with Friends and Family: Not on file  . Attends Religious Services: Not on file  .  Active Member of Clubs or Organizations: Not on file  . Attends Archivist Meetings: Not on file  . Marital Status: Not on file  Intimate Partner Violence:   . Fear of Current or Ex-Partner: Not on file  . Emotionally Abused: Not on file  . Physically Abused: Not on file  . Sexually Abused: Not on file    Family History  Problem Relation Age of Onset  . Melanoma Father        face  . Colon cancer Paternal Uncle        died of colon ca - dx at age 105's  . Diabetes Neg Hx     Allergies  Allergen Reactions  . Sulfa Antibiotics Other (See Comments)    As a child-told he had reaction to sulfa drugs.     Review of Systems   Review of Systems: Negative Unless Checked Constitutional: [] Weight loss  [] Fever  [] Chills Cardiac: [] Chest pain   []  Atrial Fibrillation  [] Palpitations   [] Shortness of breath when laying flat   [x] Shortness of breath with exertion. [] Shortness of breath at rest Vascular:  [] Pain in legs with walking   [] Pain in legs with standing [] Pain in legs when laying flat   [] Claudication    [] Pain in feet when laying flat    [] History of DVT   [] Phlebitis   [x] Swelling in legs   [x] Varicose veins   [] Non-healing ulcers Pulmonary:   [] Uses home oxygen   [] Productive cough   [] Hemoptysis   [] Wheeze  [] COPD   [] Asthma Neurologic:  [] Dizziness   [] Seizures  [] Blackouts [] History of stroke   [] History of TIA  [] Aphasia   [] Temporary Blindness   [] Weakness or numbness in arm   [] Weakness or numbness in leg Musculoskeletal:   [] Joint swelling   [] Joint pain   [] Low back pain  []  History of Knee Replacement [x] Arthritis [] back Surgeries  []  Spinal Stenosis    Hematologic:  [] Easy bruising  [] Easy bleeding   [] Hypercoagulable state   [] Anemic Gastrointestinal:  [] Diarrhea   [] Vomiting  [] Gastroesophageal reflux/heartburn   [] Difficulty swallowing. [] Abdominal pain Genitourinary:  [] Chronic kidney disease   [] Difficult urination  [] Anuric   [] Blood in urine [] Frequent  urination  [] Burning with urination   [] Hematuria Skin:  [] Rashes   [] Ulcers [] Wounds Psychological:  [] History of anxiety   []  History of major depression  []  Memory Difficulties      OBJECTIVE:   Physical Exam  BP (!) 151/80 (BP Location: Right Arm)   Pulse 79   Resp 16   Wt 280 lb (127 kg)   BMI 42.57 kg/m   Gen: WD/WN, NAD Head: [place]/AT, No temporalis wasting.  Ear/Nose/Throat: Hearing grossly intact, nares w/o erythema or drainage Eyes:  PER, EOMI, sclera nonicteric.  Neck: Supple, no masses.  No JVD.  Pulmonary:  Good air movement, no use of accessory muscles.  Cardiac: RRR Vascular: scattered varicosities bilaterally. Mild stasis dermatitis bilaterally 2+ soft edema  Vessel Right Left  Radial Palpable Palpable  Dorsalis Pedis Palpable Palpable  Posterior Tibial Palpable Palpable   Gastrointestinal: soft, non-distended. No guarding/no peritoneal signs.  Musculoskeletal: M/S 5/5 throughout.  No deformity or atrophy.  Neurologic: Pain and light touch intact in extremities.  Symmetrical.  Speech is fluent. Motor exam as listed above. Psychiatric: Judgment intact, Mood & affect appropriate for pt's clinical situation. Dermatologic: No Ulcers Noted.  No changes consistent with cellulitis. Lymph : No Cervical lymphadenopathy, no lichenification or skin changes of chronic lymphedema.       ASSESSMENT AND PLAN:  1. Varicose veins of bilateral lower extremities with other complications Based on the patient's noninvasive studies he currently has reflux in both the deep system as well as the superficial system.  However the patient has had numerous thrombotic events and now he has partial compressibility and evidence of chronic thrombus in both his deep and superficial systems in his bilateral lower extremities.  Based on this, proceeding with endovenous laser ablation purely for purposes of relieving inflammation and discomfort there would not be great benefit.  In this instance if  the patient were to develop venous ulcerations then we can consider undergoing endovenous laser ablation however until that time the patient should continue conservative therapy.  The patient is in agreement with this plan.  We will have the patient follow-up in 1 year.  2. Primary osteoarthritis of left knee Continue NSAID medications as already ordered, these medications have been reviewed and there are no changes at this time.  Continued activity and therapy was stressed.   3. Hypercholesteremia Continue statin as ordered and reviewed, no changes at this time    Current Outpatient Medications on File Prior to Visit  Medication Sig Dispense Refill  . diphenhydramine-acetaminophen (TYLENOL PM) 25-500 MG TABS tablet Take 1-2 tablets by mouth at bedtime.     . methocarbamol (ROBAXIN) 500 MG tablet Take 1 tablet (500 mg total) by mouth every 6 (six) hours as needed for muscle spasms. 30 tablet 0  . oxyCODONE (OXY IR/ROXICODONE) 5 MG immediate release tablet Take 5 mg by mouth 2 (two) times a day.    . simvastatin (ZOCOR) 40 MG tablet Take 60 mg by mouth at bedtime.     . metoprolol tartrate (LOPRESSOR) 25 MG tablet Take 1 tablet (25 mg total) by mouth 2 (two) times daily. (Patient not taking: Reported on 03/14/2019) 60 tablet 0   No current facility-administered medications on file prior to visit.    There are no Patient Instructions on file for this visit. No follow-ups on file.   Georgiana Spinner, NP  This note was completed with Office manager.  Any errors are purely unintentional.

## 2019-06-11 ENCOUNTER — Telehealth (INDEPENDENT_AMBULATORY_CARE_PROVIDER_SITE_OTHER): Payer: Self-pay

## 2019-06-11 ENCOUNTER — Other Ambulatory Visit (INDEPENDENT_AMBULATORY_CARE_PROVIDER_SITE_OTHER): Payer: Self-pay | Admitting: Nurse Practitioner

## 2019-06-11 DIAGNOSIS — Z86718 Personal history of other venous thrombosis and embolism: Secondary | ICD-10-CM

## 2019-06-11 DIAGNOSIS — M7989 Other specified soft tissue disorders: Secondary | ICD-10-CM

## 2019-06-11 MED ORDER — APIXABAN (ELIQUIS) VTE STARTER PACK (10MG AND 5MG): ORAL_TABLET | ORAL | 0 refills | Status: DC

## 2019-06-11 NOTE — Telephone Encounter (Signed)
I will send in a prescription for Eliquis.  Generally I would like to verify that the patient has a DVT before sending in anticoagulation however, the patient had extensive DVTs in both lower extremities.  I will send in the prescription however the patient will need to present to the office sometime this week to see myself or Dr. Gilda Crease with a right lower extremity DVT study.

## 2019-06-11 NOTE — Telephone Encounter (Signed)
Patient has been made aware with medical advice and has been schedule for 10:30 on 06/12/19

## 2019-06-12 ENCOUNTER — Ambulatory Visit (INDEPENDENT_AMBULATORY_CARE_PROVIDER_SITE_OTHER): Payer: PRIVATE HEALTH INSURANCE

## 2019-06-12 ENCOUNTER — Other Ambulatory Visit: Payer: Self-pay

## 2019-06-12 ENCOUNTER — Ambulatory Visit (INDEPENDENT_AMBULATORY_CARE_PROVIDER_SITE_OTHER): Payer: PRIVATE HEALTH INSURANCE | Admitting: Nurse Practitioner

## 2019-06-12 ENCOUNTER — Encounter (INDEPENDENT_AMBULATORY_CARE_PROVIDER_SITE_OTHER): Payer: Self-pay | Admitting: Nurse Practitioner

## 2019-06-12 VITALS — BP 143/76 | HR 105 | Resp 20 | Ht 69.0 in | Wt 287.0 lb

## 2019-06-12 DIAGNOSIS — M7989 Other specified soft tissue disorders: Secondary | ICD-10-CM | POA: Diagnosis not present

## 2019-06-12 DIAGNOSIS — I890 Lymphedema, not elsewhere classified: Secondary | ICD-10-CM | POA: Diagnosis not present

## 2019-06-12 DIAGNOSIS — I8001 Phlebitis and thrombophlebitis of superficial vessels of right lower extremity: Secondary | ICD-10-CM

## 2019-06-12 DIAGNOSIS — Z86718 Personal history of other venous thrombosis and embolism: Secondary | ICD-10-CM

## 2019-06-14 ENCOUNTER — Encounter (INDEPENDENT_AMBULATORY_CARE_PROVIDER_SITE_OTHER): Payer: Self-pay | Admitting: Nurse Practitioner

## 2019-06-14 NOTE — Progress Notes (Signed)
SUBJECTIVE:  Patient ID: Frank Rivera, male    DOB: 03-14-1961, 59 y.o.   MRN: [id number] Chief Complaint  Patient presents with  . Follow-up    HPI  Frank Rivera is a 59 y.o. male that contacted our office on 06/11/2019 with concern for possible new DVT.  The patient has had an extensive history of chronic DVTs bilaterally as well as superficial venous thrombosis.  The patient states that he had a hard somewhat painful area on the back of his right calf and was concerned could be related to a new DVT.  He requested anticoagulation to be started.  Prescription was called in for the patient with the advice for [name] to do lower extremity noninvasive studies.  The patient states that since his contact with [name] the pain has subsided substantially.  Today patient does have evidence of chronic thrombosis in the bilateral lower extremities that is consistent with his previous study on 03/25/2019.  There are no new acute findings.  Past Medical History:  Diagnosis Date  . Dyspnea   . Dyspnea on exertion   . Elevated lipids   . Hx of blood clots    legs  . Pulmonary emboli (HCC)   . Pulmonary embolism (HCC) 2011  . Pulmonary embolism (HCC)    2017    Past Surgical History:  Procedure Laterality Date  . APPENDECTOMY     as child  . IVC FILTER INSERTION Right 02/21/2018   Procedure: IVC FILTER INSERTION;  Surgeon: Renford Dills, MD;  Location: [facility];  Service: Cardiovascular;  Laterality: Right;  . IVC FILTER REMOVAL N/A 07/31/2018   Procedure: IVC FILTER REMOVAL;  Surgeon: Renford Dills, MD;  Location: [facility];  Service: Cardiovascular;  Laterality: N/A;  . TONSILLECTOMY AND ADENOIDECTOMY     as child  . TOTAL KNEE ARTHROPLASTY Bilateral 02/27/2018   Procedure: TOTAL KNEE BILATERAL;  Surgeon: Kennedy Bucker, MD;  Location: [facility];  Service: Orthopedics;  Laterality: Bilateral;    Social History   Socioeconomic History  . Marital status: Married   Spouse name: Not on file  . Number of children: Not on file  . Years of education: Not on file  . Highest education level: Not on file  Occupational History  . Not on file  Tobacco Use  . Smoking status: Former Smoker    Packs/day: 1.00    Years: 25.00    Pack years: 25.00    Types: Cigarettes    Quit date: 11/17/2015    Years since quitting: 3.5  . Smokeless tobacco: Never Used  Substance and Sexual Activity  . Alcohol use: Yes    Comment: rare  . Drug use: No  . Sexual activity: Yes    Partners: Female  Other Topics Concern  . Not on file  Social History Narrative  . Not on file   Social Determinants of Health   Financial Resource Strain:   . Difficulty of Paying Living Expenses:   Food Insecurity:   . Worried About Programme researcher, broadcasting/film/video in the Last Year:   . Barista in the Last Year:   Transportation Needs:   . Freight forwarder (Medical):   Marland Kitchen Lack of Transportation (Non-Medical):   Physical Activity:   . Days of Exercise per Week:   . Minutes of Exercise per Session:   Stress:   . Feeling of Stress :   Social Connections:   . Frequency of Communication  with Friends and Family:   . Frequency of Social Gatherings with Friends and Family:   . Attends Religious Services:   . Active Member of Clubs or Organizations:   . Attends Banker Meetings:   Marland Kitchen Marital Status:   Intimate Partner Violence:   . Fear of Current or Ex-Partner:   . Emotionally Abused:   Marland Kitchen Physically Abused:   . Sexually Abused:     Family History  Problem Relation Age of Onset  . Melanoma Father        face  . Colon cancer Paternal Uncle        died of colon ca - dx at age 76's  . Diabetes Neg Hx     Allergies  Allergen Reactions  . Sulfa Antibiotics Other (See Comments)    As a child-told he had reaction to sulfa drugs.     Review of Systems   Review of Systems: Negative Unless Checked Constitutional: [] Weight loss  [] Fever  [] Chills Cardiac: [] Chest  pain   []  Atrial Fibrillation  [] Palpitations   [] Shortness of breath when laying flat   [] Shortness of breath with exertion. [] Shortness of breath at rest Vascular:  [] Pain in legs with walking   [] Pain in legs with standing [] Pain in legs when laying flat   [] Claudication    [] Pain in feet when laying flat    [x] History of DVT   [] Phlebitis   [x] Swelling in legs   [] Varicose veins   [] Non-healing ulcers Pulmonary:   [] Uses home oxygen   [] Productive cough   [] Hemoptysis   [] Wheeze  [] COPD   [] Asthma Neurologic:  [] Dizziness   [] Seizures  [] Blackouts [] History of stroke   [] History of TIA  [] Aphasia   [] Temporary Blindness   [] Weakness or numbness in arm   [] Weakness or numbness in leg Musculoskeletal:   [] Joint swelling   [] Joint pain   [] Low back pain  []  History of Knee Replacement [] Arthritis [] back Surgeries  []  Spinal Stenosis    Hematologic:  [] Easy bruising  [] Easy bleeding   [] Hypercoagulable state   [] Anemic Gastrointestinal:  [] Diarrhea   [] Vomiting  [] Gastroesophageal reflux/heartburn   [] Difficulty swallowing. [] Abdominal pain Genitourinary:  [] Chronic kidney disease   [] Difficult urination  [] Anuric   [] Blood in urine [] Frequent urination  [] Burning with urination   [] Hematuria Skin:  [] Rashes   [] Ulcers [] Wounds Psychological:  [] History of anxiety   []  History of major depression  []  Memory Difficulties      OBJECTIVE:   Physical Exam  BP (!) 143/76 (BP Location: Right Arm)   Pulse (!) 105   Resp 20   Ht 5\' 9"  (1.753 m)   Wt 287 lb (130.2 kg)   BMI 42.38 kg/m   Gen: WD/WN, NAD Head: [place]/AT, No temporalis wasting.  Ear/Nose/Throat: Hearing grossly intact, nares w/o erythema or drainage Eyes: PER, EOMI, sclera nonicteric.  Neck: Supple, no masses.  No JVD.  Pulmonary:  Good air movement, no use of accessory muscles.  Cardiac: RRR Vascular:  Scattered varicosities bilaterally.  Hardened area on posterior right calf.  2+ edema bilaterally Vessel Right Left  Radial  Palpable Palpable  Dorsalis Pedis Palpable Palpable  Posterior Tibial Palpable Palpable   Gastrointestinal: soft, non-distended. No guarding/no peritoneal signs.  Musculoskeletal: M/S 5/5 throughout.  No deformity or atrophy.  Neurologic: Pain and light touch intact in extremities.  Symmetrical.  Speech is fluent. Motor exam as listed above. Psychiatric: Judgment intact, Mood & affect appropriate for pt's clinical situation. Dermatologic:  Bilateral  stasis dermatitis.. No Ulcers Noted.  No changes consistent with cellulitis. Lymph : No Cervical lymphadenopathy, no lichenification or skin changes of chronic lymphedema.       ASSESSMENT AND PLAN:  1. Superficial thrombophlebitis of right leg After discussion with the patient he will not begin anticoagulation at this time.  The hardware in his calf at risky scenario could be a superficial venous thrombosis which we would typically treat with aspirin.  Patient advised to be taking aspirin daily to help with pain and discomfort.  Patient has a follow-up appointment in December and he will continue to follow-up with [name] at that time unless there are new changes.  2. Lymphedema Patient will continue with medical grade 1 conservative therapy including elevating his lower extremities exercise as well as wearing medical grade 1 compression stockings.  The patient has been diligent with his conservative therapy and feels that his swelling is well controlled at this time.   Current Outpatient Medications on File Prior to Visit  Medication Sig Dispense Refill  . diphenhydramine-acetaminophen (TYLENOL PM) 25-500 MG TABS tablet Take 1-2 tablets by mouth at bedtime.     . methocarbamol (ROBAXIN) 500 MG tablet Take 1 tablet (500 mg total) by mouth every 6 (six) hours as needed for muscle spasms. 30 tablet 0  . metoprolol tartrate (LOPRESSOR) 25 MG tablet Take 1 tablet (25 mg total) by mouth 2 (two) times daily. 60 tablet 0  . oxyCODONE (OXY IR/ROXICODONE) 5  MG immediate release tablet Take 5 mg by mouth 2 (two) times a day.    . simvastatin (ZOCOR) 40 MG tablet Take 60 mg by mouth at bedtime.     Marland Kitchen Apixaban Starter Pack (ELIQUIS STARTER PACK) 5 MG TBPK Take as directed on package: start with two-5mg  tablets twice daily for 7 days. On day 8, switch to one-5mg  tablet twice daily. (Patient not taking: Reported on 06/12/2019) 1 each 0   No current facility-administered medications on file prior to visit.    There are no Patient Instructions on file for this visit. No follow-ups on file.   Kris Hartmann, NP  This note was completed with Sales executive.  Any errors are purely unintentional.

## 2019-11-24 IMAGING — CT CT KNEE*R* W/O CM
4 of 8 series · 12 of 33 positions shown, 13 images · non-contrast
Comparison: None.

CLINICAL DATA: Pre Op MY KNEE Planning. Patient complains of
worsening bilateral knee swelling and pain due to osteoarthritis.

EXAM:
CT OF THE RIGHT KNEE WITHOUT CONTRAST
TECHNIQUE: Multidetector CT imaging of the RIGHT knee was performed according
to the standard protocol. Multiplanar CT image reconstructions were
also generated.

[Series 3: right axial bone knee · axial · 0.39mm/px · z∈[+975,+1167]mm · 3 of 194 slices shown, 4 images]
[im 49/194  soft-tissue]
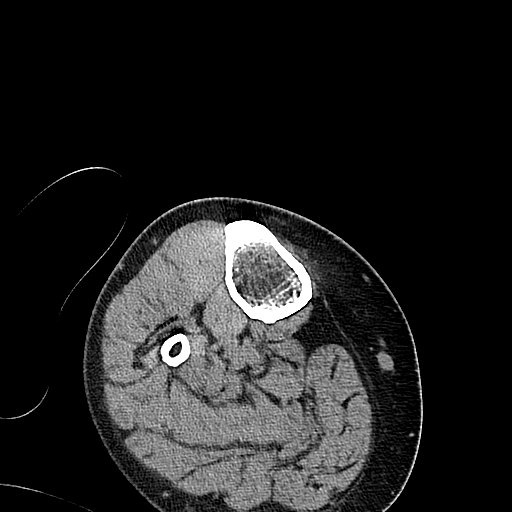
[im 49/194  bone]
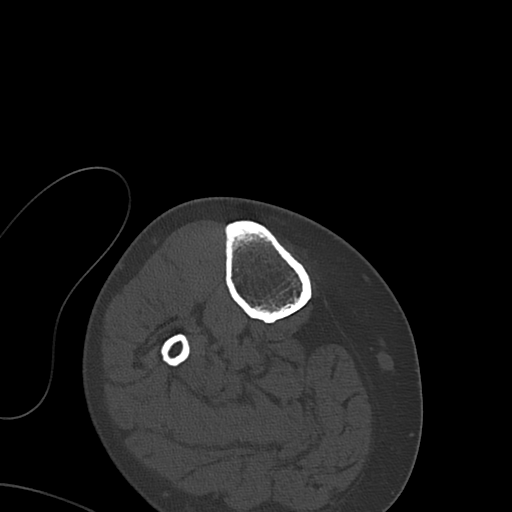
[im 97/194  bone]
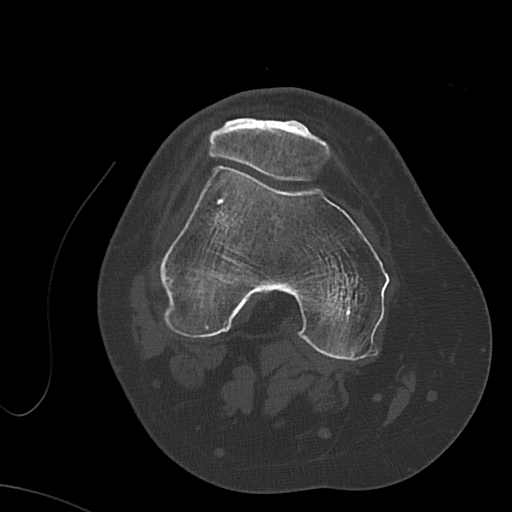
[im 145/194  bone]
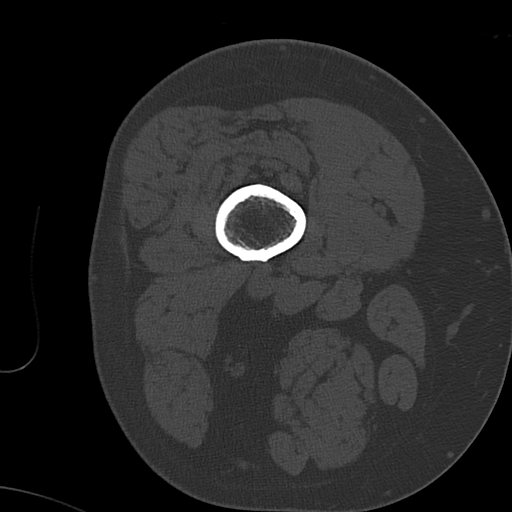

[Series 4: right axial st knee · axial · 0.39mm/px · z∈[+975,+1167]mm · 3 of 194 slices shown]
[im 49/194  bone]
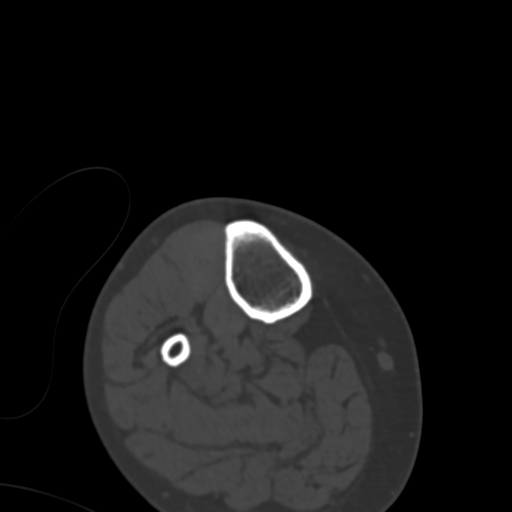
[im 97/194  bone]
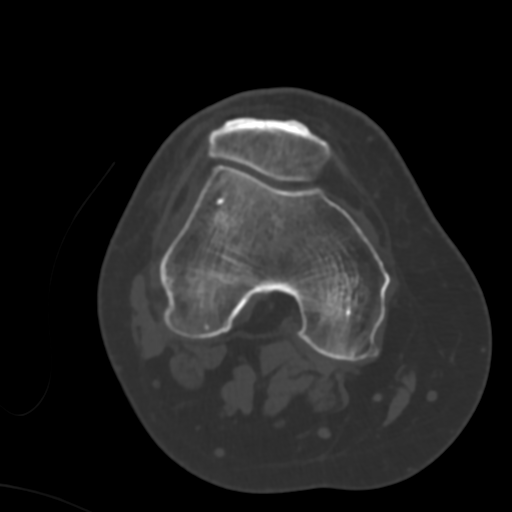
[im 145/194  bone]
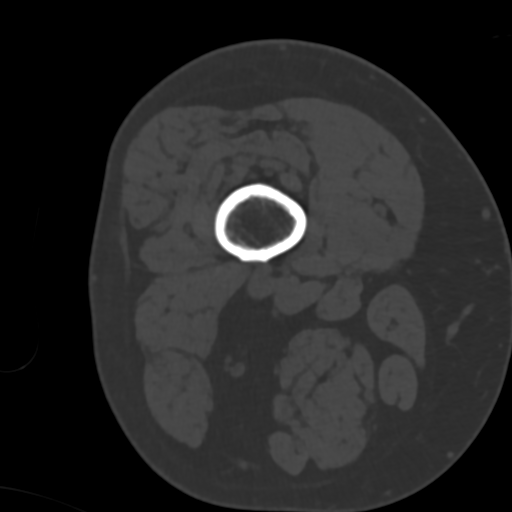

[Series 5: right coronal bone knee · coronal · 0.39mm/px · 1 of 100 slices shown]
[im 50/100  bone]
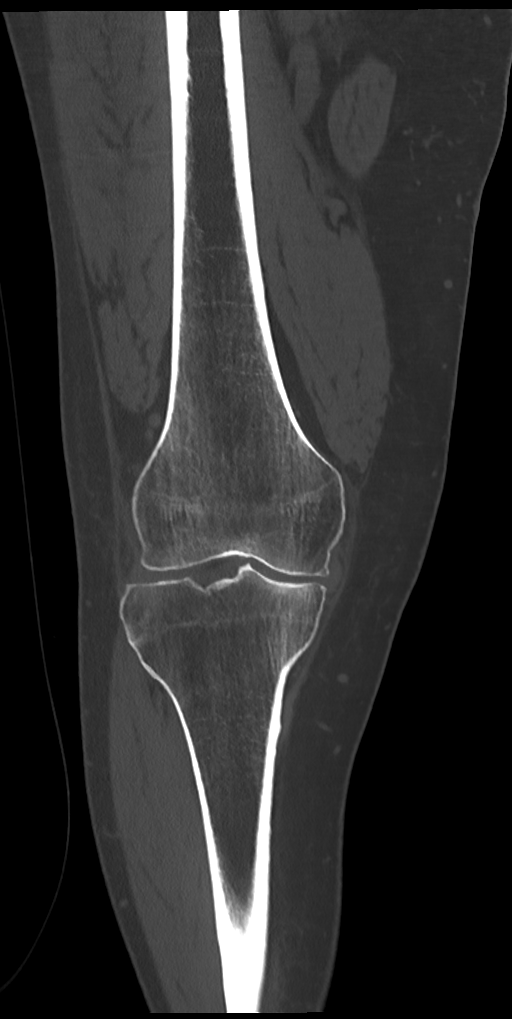

[Series 7: right sagittal bone knee · sagittal · 0.39mm/px · 5 of 100 slices shown]
[im 17/100  bone]
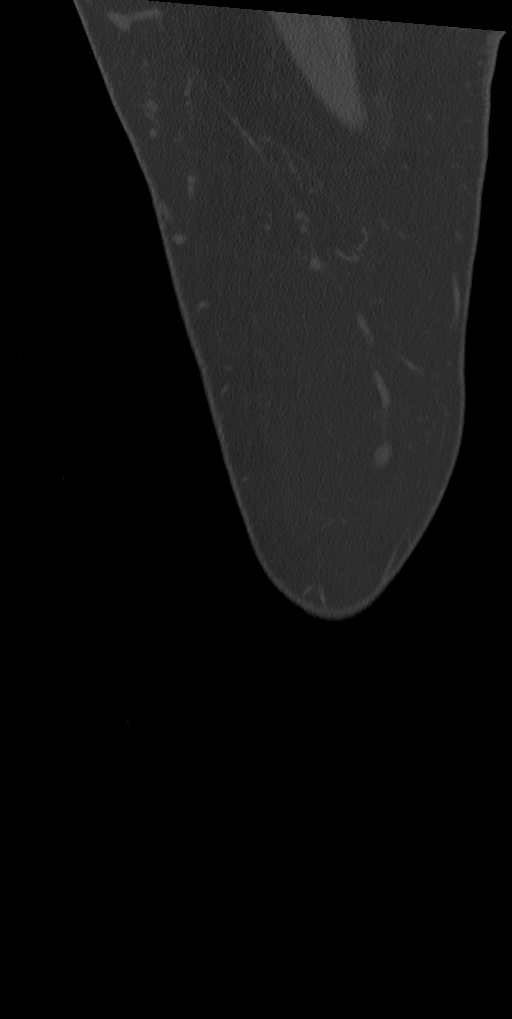
[im 34/100  bone]
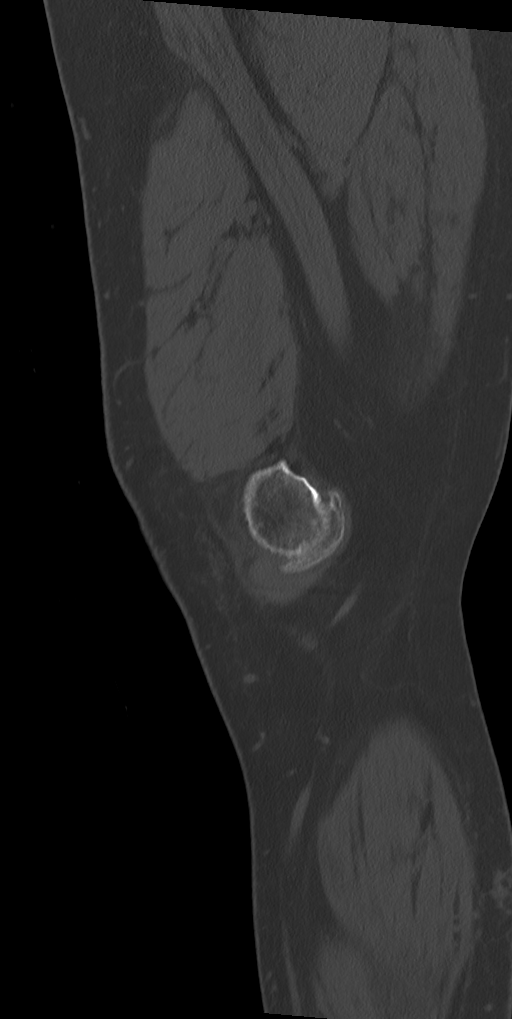
[im 50/100  bone]
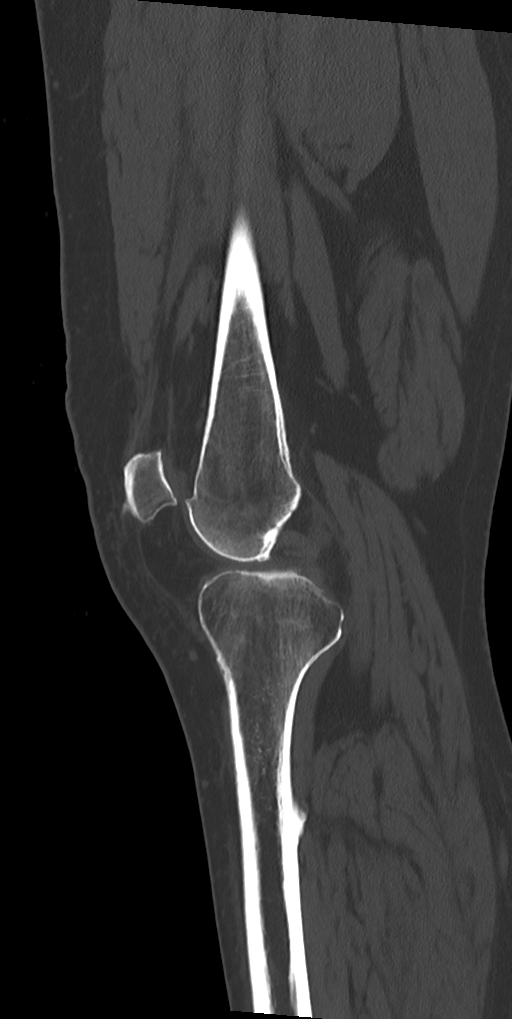
[im 67/100  bone]
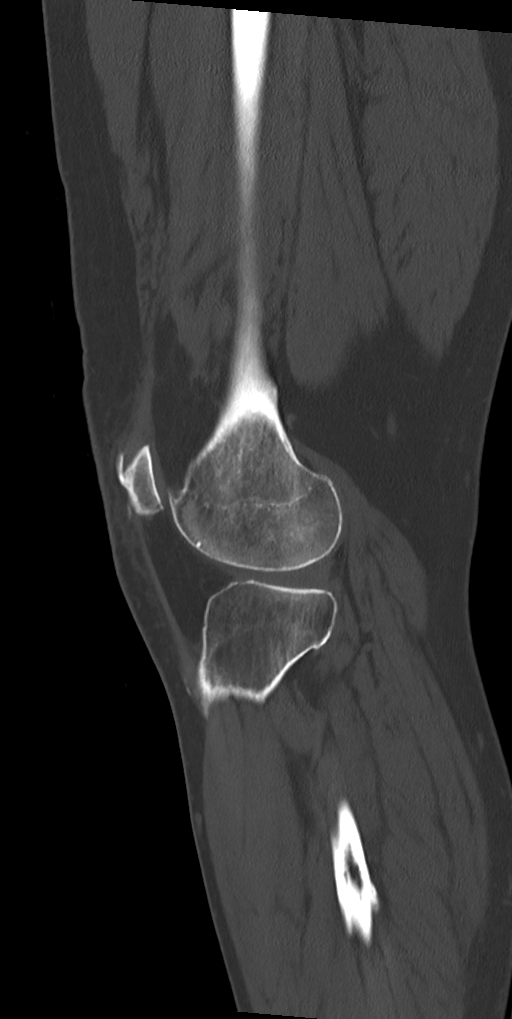
[im 83/100  bone]
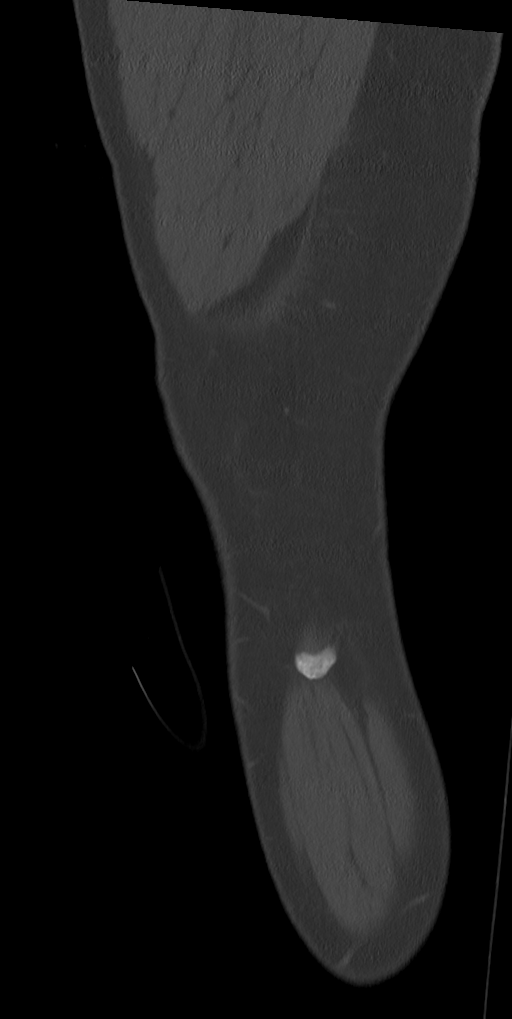

[12 of 33 positions shown; findings below may reference images not displayed]

FINDINGS: Bones/Joint/Cartilage

The hip demonstrates no fracture or dislocation. There is no lytic
or blastic lesion. There is moderate osteoarthritis of the right
sacroiliac joint.

The knee demonstrates no fracture or dislocation. There is no
aggressive lytic or blastic lesion. There is moderate-severe medial
femorotibial compartment joint space narrowing. There is mild
lateral femorotibial compartment joint space narrowing. There is
mild patellofemoral compartment joint space narrowing. There is no
significant joint effusion. There is no Baker's cyst.

The ankle demonstrates no fracture or dislocation. There is no lytic
or blastic lesion. Incidental note made of a bipartite os
naviculare. There are enthesopathic changes at the Achilles tendon
insertion. There is a small plantar calcaneal spur.

Ligaments

Suboptimally assessed by CT.

Muscles and Tendons

The muscles are normal. The quadriceps tendon and patellar tendon
are intact.

Soft tissues

There is no fluid collection or hematoma. There is no soft tissue
mass. There is peripheral vascular atherosclerotic disease.
IMPRESSION: 1. Tricompartmental osteoarthritis as described above.
2.  No acute osseous injury of the right knee.

## 2020-01-28 ENCOUNTER — Other Ambulatory Visit (INDEPENDENT_AMBULATORY_CARE_PROVIDER_SITE_OTHER): Payer: Self-pay | Admitting: Nurse Practitioner

## 2020-01-28 ENCOUNTER — Other Ambulatory Visit: Payer: Self-pay

## 2020-01-28 ENCOUNTER — Ambulatory Visit (INDEPENDENT_AMBULATORY_CARE_PROVIDER_SITE_OTHER): Payer: PRIVATE HEALTH INSURANCE

## 2020-01-28 ENCOUNTER — Encounter (INDEPENDENT_AMBULATORY_CARE_PROVIDER_SITE_OTHER): Payer: Self-pay | Admitting: Nurse Practitioner

## 2020-01-28 ENCOUNTER — Ambulatory Visit (INDEPENDENT_AMBULATORY_CARE_PROVIDER_SITE_OTHER): Payer: PRIVATE HEALTH INSURANCE | Admitting: Nurse Practitioner

## 2020-01-28 VITALS — BP 136/84 | HR 90 | Resp 16 | Wt 289.0 lb

## 2020-01-28 DIAGNOSIS — M1712 Unilateral primary osteoarthritis, left knee: Secondary | ICD-10-CM | POA: Diagnosis not present

## 2020-01-28 DIAGNOSIS — Z86718 Personal history of other venous thrombosis and embolism: Secondary | ICD-10-CM | POA: Diagnosis not present

## 2020-01-28 DIAGNOSIS — I82412 Acute embolism and thrombosis of left femoral vein: Secondary | ICD-10-CM

## 2020-01-28 DIAGNOSIS — M79605 Pain in left leg: Secondary | ICD-10-CM

## 2020-01-28 DIAGNOSIS — M7989 Other specified soft tissue disorders: Secondary | ICD-10-CM | POA: Diagnosis not present

## 2020-01-28 DIAGNOSIS — E7800 Pure hypercholesterolemia, unspecified: Secondary | ICD-10-CM | POA: Diagnosis not present

## 2020-01-28 MED ORDER — WARFARIN SODIUM 5 MG PO TABS: 5.0000 mg | ORAL_TABLET | Freq: Once | ORAL | 5 refills | Status: DC

## 2020-01-29 ENCOUNTER — Other Ambulatory Visit (INDEPENDENT_AMBULATORY_CARE_PROVIDER_SITE_OTHER): Payer: Self-pay | Admitting: Nurse Practitioner

## 2020-01-30 LAB — CBC WITH DIFFERENTIAL/PLATELET
Basophils Absolute: 0 10*3/uL (ref 0.0–0.2)
Basos: 1 %
EOS (ABSOLUTE): 0.1 10*3/uL (ref 0.0–0.4)
Eos: 3 %
Hematocrit: 39.5 % (ref 37.5–51.0)
Hemoglobin: 13.4 g/dL (ref 13.0–17.7)
Immature Grans (Abs): 0 10*3/uL (ref 0.0–0.1)
Immature Granulocytes: 0 %
Lymphocytes Absolute: 1.7 10*3/uL (ref 0.7–3.1)
Lymphs: 31 %
MCH: 32.6 pg (ref 26.6–33.0)
MCHC: 33.9 g/dL (ref 31.5–35.7)
MCV: 96 fL (ref 79–97)
Monocytes Absolute: 0.6 10*3/uL (ref 0.1–0.9)
Monocytes: 11 %
Neutrophils Absolute: 3.1 10*3/uL (ref 1.4–7.0)
Neutrophils: 54 %
Platelets: 261 10*3/uL (ref 150–450)
RBC: 4.11 x10E6/uL — ABNORMAL LOW (ref 4.14–5.80)
RDW: 12.1 % (ref 11.6–15.4)
WBC: 5.6 10*3/uL (ref 3.4–10.8)

## 2020-01-30 LAB — PROTIME-INR
INR: 1 (ref 0.9–1.2)
Prothrombin Time: 10.2 s (ref 9.1–12.0)

## 2020-02-02 ENCOUNTER — Encounter (INDEPENDENT_AMBULATORY_CARE_PROVIDER_SITE_OTHER): Payer: Self-pay | Admitting: Nurse Practitioner

## 2020-02-02 NOTE — Progress Notes (Signed)
Subjective:    Patient ID: Frank Rivera, male    DOB: [birth year], 59 y.o.   MRN: [id number] Chief Complaint  Patient presents with  . Follow-up    left leg pain    The patient presents today for evaluation regarding lower extremity leg pain.  The patient has a history of multiple DVTs bilaterally.  The patient noticed that his leg suddenly seemed more swollen and painful than normal.  He also notes that the leg was hotter than normal.  These were all signs symptoms consistent with his previous DVTs.  The patient has previously been on Coumadin for his DVTs.  He notes that he previously took Eliquis and does not believe that he did well on it.  He denies any chest pain or shortness of breath.  He denies any TIA or amaurosis fugax-like symptoms.  Today noninvasive studies show evidence of acute thrombus extending from the left common femoral vein through to the left profundofemoral vein as well as at the saphenofemoral junction.  There is also findings consistent with a acute superficial thrombophlebitis in the left great saphenous vein.   Review of Systems  Respiratory: Negative for shortness of breath.   Cardiovascular: Positive for leg swelling.  All other systems reviewed and are negative.      Objective:   Physical Exam Vitals reviewed.  HENT:     Head: Normocephalic.  Cardiovascular:     Rate and Rhythm: Normal rate.     Pulses: Normal pulses.  Pulmonary:     Effort: Pulmonary effort is normal.  Musculoskeletal:     Right lower leg: Edema present.     Left lower leg: Edema present.  Skin:    General: Skin is warm.     Findings: Erythema present.  Neurological:     Mental Status: He is alert and oriented to person, place, and time.  Psychiatric:        Mood and Affect: Mood normal.        Behavior: Behavior normal.        Thought Content: Thought content normal.        Judgment: Judgment normal.     BP 136/84   Pulse 90   Resp 16   Wt 289 lb (131.1 kg)   BMI  42.68 kg/m   Past Medical History:  Diagnosis Date  . Dyspnea   . Dyspnea on exertion   . Elevated lipids   . Hx of blood clots    legs  . Pulmonary emboli (HCC)   . Pulmonary embolism (HCC) 2011  . Pulmonary embolism (HCC)    2017    Social History   Socioeconomic History  . Marital status: Married    Spouse name: Not on file  . Number of children: Not on file  . Years of education: Not on file  . Highest education level: Not on file  Occupational History  . Not on file  Tobacco Use  . Smoking status: Former Smoker    Packs/day: 1.00    Years: 25.00    Pack years: 25.00    Types: Cigarettes    Quit date: 11/17/2015    Years since quitting: 4.2  . Smokeless tobacco: Never Used  Vaping Use  . Vaping Use: Never used  Substance and Sexual Activity  . Alcohol use: Yes    Comment: rare  . Drug use: No  . Sexual activity: Yes    Partners: Female  Other Topics Concern  . Not on file  Social History Narrative  . Not on file   Social Determinants of Health   Financial Resource Strain:   . Difficulty of Paying Living Expenses: Not on file  Food Insecurity:   . Worried About Programme researcher, broadcasting/film/video in the Last Year: Not on file  . Ran Out of Food in the Last Year: Not on file  Transportation Needs:   . Lack of Transportation (Medical): Not on file  . Lack of Transportation (Non-Medical): Not on file  Physical Activity:   . Days of Exercise per Week: Not on file  . Minutes of Exercise per Session: Not on file  Stress:   . Feeling of Stress : Not on file  Social Connections:   . Frequency of Communication with Friends and Family: Not on file  . Frequency of Social Gatherings with Friends and Family: Not on file  . Attends Religious Services: Not on file  . Active Member of Clubs or Organizations: Not on file  . Attends Banker Meetings: Not on file  . Marital Status: Not on file  Intimate Partner Violence:   . Fear of Current or Ex-Partner: Not on  file  . Emotionally Abused: Not on file  . Physically Abused: Not on file  . Sexually Abused: Not on file    Past Surgical History:  Procedure Laterality Date  . APPENDECTOMY     as child  . IVC FILTER INSERTION Right 02/21/2018   Procedure: IVC FILTER INSERTION;  Surgeon: Renford Dills, MD;  Location: [facility];  Service: Cardiovascular;  Laterality: Right;  . IVC FILTER REMOVAL N/A 07/31/2018   Procedure: IVC FILTER REMOVAL;  Surgeon: Renford Dills, MD;  Location: [facility];  Service: Cardiovascular;  Laterality: N/A;  . TONSILLECTOMY AND ADENOIDECTOMY     as child  . TOTAL KNEE ARTHROPLASTY Bilateral 02/27/2018   Procedure: TOTAL KNEE BILATERAL;  Surgeon: Kennedy Bucker, MD;  Location: [facility];  Service: Orthopedics;  Laterality: Bilateral;    Family History  Problem Relation Age of Onset  . Melanoma Father        face  . Colon cancer Paternal Uncle        died of colon ca - dx at age 56's  . Diabetes Neg Hx     Allergies  Allergen Reactions  . Sulfa Antibiotics Other (See Comments)    As a child-told he had reaction to sulfa drugs.    CBC Latest Ref Rng & Units 01/29/2020 03/05/2018 03/04/2018  WBC 3.4 - 10.8 x10E3/uL 5.6 6.6 7.8  Hemoglobin 13.0 - 17.7 g/dL 79.8 9.2(J) 1.9(E)  Hematocrit 37.5 - 51.0 % 39.5 25.8(L) 26.3(L)  Platelets 150 - 450 x10E3/uL 261 219 213      CMP     Component Value Date/Time   NA 137 03/03/2018 0308   K 3.8 03/03/2018 0308   CL 101 03/03/2018 0308   CO2 25 03/03/2018 0308   GLUCOSE 125 (H) 03/03/2018 0308   BUN 29 (H) 03/03/2018 0308   CREATININE 0.63 03/06/2018 0451   CALCIUM 8.4 (L) 03/03/2018 0308   PROT 7.4 11/18/2015 1804   ALBUMIN 3.5 11/18/2015 1804   AST 17 11/18/2015 1804   ALT 27 11/18/2015 1804   ALKPHOS 90 11/18/2015 1804   BILITOT 1.7 (H) 11/18/2015 1804   GFRNONAA >60 03/06/2018 0451   GFRAA >60 03/06/2018 0451     @COAG @  Radiology     Assessment & Plan:   1. Acute deep  vein thrombosis (DVT) of femoral vein of left lower extremity (HCC) The patient previously has taken Coumadin and done well on this.  We will obtain lab work and patient will begin Coumadin following.  We will continue to monitor INR levels until patient is stable.  We will have the patient follow-up in 6 weeks for follow-up studies. - warfarin (COUMADIN) 5 MG tablet; Take 1 tablet (5 mg total) by mouth one time only at 4 PM for 180 doses.  Dispense: 30 tablet; Refill: 5  2. Primary osteoarthritis of left knee Continue NSAID medications as already ordered, these medications have been reviewed and there are no changes at this time.  Continued activity and therapy was stressed.   3. Hypercholesteremia Continue statin as ordered and reviewed, no changes at this time    Current Outpatient Medications on File Prior to Visit  Medication Sig Dispense Refill  . aspirin EC 81 MG tablet Take 81 mg by mouth daily. Swallow whole.    . diphenhydramine-acetaminophen (TYLENOL PM) 25-500 MG TABS tablet Take 1-2 tablets by mouth at bedtime.     . methocarbamol (ROBAXIN) 500 MG tablet Take 1 tablet (500 mg total) by mouth every 6 (six) hours as needed for muscle spasms. 30 tablet 0  . metoprolol tartrate (LOPRESSOR) 25 MG tablet Take 1 tablet (25 mg total) by mouth 2 (two) times daily. 60 tablet 0  . oxyCODONE (OXY IR/ROXICODONE) 5 MG immediate release tablet Take 5 mg by mouth 2 (two) times a day.    . simvastatin (ZOCOR) 40 MG tablet Take 60 mg by mouth at bedtime.      No current facility-administered medications on file prior to visit.    There are no Patient Instructions on file for this visit. No follow-ups on file.   Georgiana Spinner, NP

## 2020-02-03 NOTE — Progress Notes (Signed)
Can we call Frank Rivera and see about him picking up a [facility] order form to retest his INR

## 2020-02-03 NOTE — Progress Notes (Signed)
Patient will be coming by the office

## 2020-02-04 ENCOUNTER — Other Ambulatory Visit (INDEPENDENT_AMBULATORY_CARE_PROVIDER_SITE_OTHER): Payer: Self-pay | Admitting: Nurse Practitioner

## 2020-02-05 LAB — PROTIME-INR
INR: 3.1 — ABNORMAL HIGH (ref 0.9–1.2)
Prothrombin Time: 30.7 s — ABNORMAL HIGH (ref 9.1–12.0)

## 2020-02-10 NOTE — Progress Notes (Signed)
His INR is a touch high...not enough that I want to change it drastically.  Let's have him take a half pill on fridays and we'll check it again in a couple of weeks.

## 2020-02-11 NOTE — Progress Notes (Signed)
Patient was made aware with medical advice and informed that he did not take a tablet last night due to the inr level. The patient will start taking a 1/2 tablet Wednesday,Saturday,Sunday and full tablet Thursday and Friday. Per Sheppard Plumber NP if the patient is not in therapeutic level for the recheck there will be dosage adjustments.

## 2020-03-02 ENCOUNTER — Telehealth (INDEPENDENT_AMBULATORY_CARE_PROVIDER_SITE_OTHER): Payer: Self-pay

## 2020-03-02 ENCOUNTER — Other Ambulatory Visit (INDEPENDENT_AMBULATORY_CARE_PROVIDER_SITE_OTHER): Payer: Self-pay | Admitting: Nurse Practitioner

## 2020-03-02 DIAGNOSIS — M79605 Pain in left leg: Secondary | ICD-10-CM

## 2020-03-02 DIAGNOSIS — I82412 Acute embolism and thrombosis of left femoral vein: Secondary | ICD-10-CM

## 2020-03-02 DIAGNOSIS — M7989 Other specified soft tissue disorders: Secondary | ICD-10-CM

## 2020-03-02 NOTE — Telephone Encounter (Signed)
Let's bring him for a bilateral Dvt..we also need an INR level

## 2020-03-03 ENCOUNTER — Other Ambulatory Visit (INDEPENDENT_AMBULATORY_CARE_PROVIDER_SITE_OTHER): Payer: Self-pay | Admitting: Nurse Practitioner

## 2020-03-04 ENCOUNTER — Other Ambulatory Visit: Payer: Self-pay

## 2020-03-04 ENCOUNTER — Ambulatory Visit (INDEPENDENT_AMBULATORY_CARE_PROVIDER_SITE_OTHER): Payer: Self-pay

## 2020-03-04 ENCOUNTER — Ambulatory Visit (INDEPENDENT_AMBULATORY_CARE_PROVIDER_SITE_OTHER): Payer: Self-pay | Admitting: Nurse Practitioner

## 2020-03-04 VITALS — BP 170/88 | HR 84 | Ht 68.0 in | Wt 301.0 lb

## 2020-03-04 DIAGNOSIS — R600 Localized edema: Secondary | ICD-10-CM

## 2020-03-04 DIAGNOSIS — I82412 Acute embolism and thrombosis of left femoral vein: Secondary | ICD-10-CM

## 2020-03-04 DIAGNOSIS — I10 Essential (primary) hypertension: Secondary | ICD-10-CM

## 2020-03-04 DIAGNOSIS — M79605 Pain in left leg: Secondary | ICD-10-CM

## 2020-03-04 DIAGNOSIS — I890 Lymphedema, not elsewhere classified: Secondary | ICD-10-CM

## 2020-03-04 DIAGNOSIS — M7989 Other specified soft tissue disorders: Secondary | ICD-10-CM

## 2020-03-04 LAB — PROTIME-INR
INR: 6.2 (ref 0.9–1.2)
Prothrombin Time: 60.9 s — ABNORMAL HIGH (ref 9.1–12.0)

## 2020-03-04 MED ORDER — METOPROLOL TARTRATE 25 MG PO TABS: 25.0000 mg | ORAL_TABLET | Freq: Two times a day (BID) | ORAL | 11 refills | Status: DC

## 2020-03-04 MED ORDER — METOPROLOL TARTRATE 25 MG PO TABS: 25.0000 mg | ORAL_TABLET | Freq: Two times a day (BID) | ORAL | 11 refills | Status: AC

## 2020-03-08 NOTE — Progress Notes (Signed)
Subjective:    Patient ID: Frank Rivera, male    DOB: 03-09-1961, 59 y.o.   MRN: [id number] Chief Complaint  Patient presents with  . Follow-up    U/S follow up    The patient presents today for evaluation after noting that he has been having pain in his right lower extremity as well as worsening pain and swelling in his left lower extremity.  The patient has had a previous history of multiple DVTs bilaterally.  The most recent study on 01/28/2020 showed a new acute DVT in his left lower extremity.  The patient was started on Coumadin which the patient notes has worked well for him previously.  However, it was recently noted that the patient had an INR of 6.  The patient believes that he may have seen some slight blood in his urine but denies any other episodes of bleeding.  He has been taking 5 mg on Monday Tuesday Thursday and Friday with 2.5 mg on Wednesday Saturday and Sunday.  Patient also notes that he ran out of his blood pressure medicines a few weeks ago and noticed that he has gained approximately 10 pounds.  The patient is short of breath however that is a constant for him since he has had multiple pulmonary embolisms.  He denies any worsening shortness of breath, fever, chills, nausea, vomiting or diarrhea.  Today noninvasive studies show findings consistent with a chronic DVT in the right lower extremity.  There is also findings consistent with a chronic DVT in the left femoral vein and saphenofemoral junction.  There is also findings consistent with chronic superficial venous thrombosis in the small saphenous vein.  Overall these findings are improving from the previous exam.   Review of Systems  Cardiovascular: Positive for leg swelling.  All other systems reviewed and are negative.      Objective:   Physical Exam Vitals reviewed.  HENT:     Head: Normocephalic.  Cardiovascular:     Rate and Rhythm: Normal rate.  Pulmonary:     Effort: Pulmonary effort is normal.   Musculoskeletal:     Right lower leg: Edema present.     Left lower leg: Edema present.  Skin:    General: Skin is warm and dry.  Neurological:     Mental Status: He is alert and oriented to person, place, and time.  Psychiatric:        Mood and Affect: Mood normal.        Behavior: Behavior normal.        Thought Content: Thought content normal.        Judgment: Judgment normal.     BP (!) 170/88   Pulse 84   Ht 5\' 8"  (1.727 m)   Wt (!) 301 lb (136.5 kg)   BMI 45.77 kg/m   Past Medical History:  Diagnosis Date  . Dyspnea   . Dyspnea on exertion   . Elevated lipids   . Hx of blood clots    legs  . Pulmonary emboli (HCC)   . Pulmonary embolism (HCC) 2011  . Pulmonary embolism (HCC)    2017    Social History   Socioeconomic History  . Marital status: Married    Spouse name: Not on file  . Number of children: Not on file  . Years of education: Not on file  . Highest education level: Not on file  Occupational History  . Not on file  Tobacco Use  . Smoking status: Former Smoker  Packs/day: 1.00    Years: 25.00    Pack years: 25.00    Types: Cigarettes    Quit date: 11/17/2015    Years since quitting: 4.3  . Smokeless tobacco: Never Used  Vaping Use  . Vaping Use: Never used  Substance and Sexual Activity  . Alcohol use: Yes    Comment: rare  . Drug use: No  . Sexual activity: Yes    Partners: Female  Other Topics Concern  . Not on file  Social History Narrative  . Not on file   Social Determinants of Health   Financial Resource Strain: Not on file  Food Insecurity: Not on file  Transportation Needs: Not on file  Physical Activity: Not on file  Stress: Not on file  Social Connections: Not on file  Intimate Partner Violence: Not on file    Past Surgical History:  Procedure Laterality Date  . APPENDECTOMY     as child  . IVC FILTER INSERTION Right 02/21/2018   Procedure: IVC FILTER INSERTION;  Surgeon: Renford Dills, MD;  Location:  [facility];  Service: Cardiovascular;  Laterality: Right;  . IVC FILTER REMOVAL N/A 07/31/2018   Procedure: IVC FILTER REMOVAL;  Surgeon: Renford Dills, MD;  Location: [facility];  Service: Cardiovascular;  Laterality: N/A;  . TONSILLECTOMY AND ADENOIDECTOMY     as child  . TOTAL KNEE ARTHROPLASTY Bilateral 02/27/2018   Procedure: TOTAL KNEE BILATERAL;  Surgeon: Kennedy Bucker, MD;  Location: [facility];  Service: Orthopedics;  Laterality: Bilateral;    Family History  Problem Relation Age of Onset  . Melanoma Father        face  . Colon cancer Paternal Uncle        died of colon ca - dx at age 13's  . Diabetes Neg Hx     Allergies  Allergen Reactions  . Sulfa Antibiotics Other (See Comments)    As a child-told he had reaction to sulfa drugs.    CBC Latest Ref Rng & Units 01/29/2020 03/05/2018 03/04/2018  WBC 3.4 - 10.8 x10E3/uL 5.6 6.6 7.8  Hemoglobin 13.0 - 17.7 g/dL 07.3 7.1(G) 6.2(I)  Hematocrit 37.5 - 51.0 % 39.5 25.8(L) 26.3(L)  Platelets 150 - 450 x10E3/uL 261 219 213      CMP     Component Value Date/Time   NA 137 03/03/2018 0308   K 3.8 03/03/2018 0308   CL 101 03/03/2018 0308   CO2 25 03/03/2018 0308   GLUCOSE 125 (H) 03/03/2018 0308   BUN 29 (H) 03/03/2018 0308   CREATININE 0.63 03/06/2018 0451   CALCIUM 8.4 (L) 03/03/2018 0308   PROT 7.4 11/18/2015 1804   ALBUMIN 3.5 11/18/2015 1804   AST 17 11/18/2015 1804   ALT 27 11/18/2015 1804   ALKPHOS 90 11/18/2015 1804   BILITOT 1.7 (H) 11/18/2015 1804   GFRNONAA >60 03/06/2018 0451   GFRAA >60 03/06/2018 0451     No results found.     Assessment & Plan:   1. Acute deep vein thrombosis (DVT) of left femoral vein (HCC) Noninvasive studies did not show any new or worsening DVT in either lower extremity.  The patient's INR was noted to be a 6 by [facility].  We will have the patient hold his Coumadin for 3 days and then at that time he will begin taking a half a tablet daily.  The patient is  advised to recheck his INR in 1 week.  Patient also has an  upcoming visit to our office in 2 weeks to reevaluate his lower extremity pain.  Patient thinks that he also noticed some slightly bloody urine.  The patient is advised that if he begins to turn bright red in color or becomes painful he should present to the urgent care for evaluation.  2. Lymphedema  No surgery or intervention at this point in time.    I have reviewed my discussion with the patient regarding lymphedema and why it  causes symptoms.  Patient will continue wearing graduated compression stockings class 1 (20-30 mmHg) on a daily basis a prescription was given. The patient is reminded to put the stockings on first thing in the morning and removing them in the evening. The patient is instructed specifically not to sleep in the stockings.   In addition, behavioral modification throughout the day will be continued.  This will include frequent elevation (such as in a recliner), use of over the counter pain medications as needed and exercise such as walking.  I have reviewed systemic causes for chronic edema such as liver, kidney and cardiac etiologies and there does not appear to be any significant changes in these organ systems over the past year.  The patient is under the impression that these organ systems are all stable and unchanged.       3. Essential hypertension Continue antihypertensive medications as already ordered, these medications have been reviewed and there are no changes at this time.  - metoprolol tartrate (LOPRESSOR) 25 MG tablet; Take 1 tablet (25 mg total) by mouth 2 (two) times daily.  Dispense: 60 tablet; Refill: 11   Current Outpatient Medications on File Prior to Visit  Medication Sig Dispense Refill  . aspirin EC 81 MG tablet Take 81 mg by mouth daily. Swallow whole.    . diphenhydramine-acetaminophen (TYLENOL PM) 25-500 MG TABS tablet Take 1-2 tablets by mouth at bedtime.     . methocarbamol  (ROBAXIN) 500 MG tablet Take 1 tablet (500 mg total) by mouth every 6 (six) hours as needed for muscle spasms. 30 tablet 0  . oxyCODONE (OXY IR/ROXICODONE) 5 MG immediate release tablet Take 5 mg by mouth 2 (two) times a day.    . simvastatin (ZOCOR) 40 MG tablet Take 60 mg by mouth at bedtime.     Marland Kitchen warfarin (COUMADIN) 5 MG tablet Take 1 tablet (5 mg total) by mouth one time only at 4 PM for 180 doses. 30 tablet 5   No current facility-administered medications on file prior to visit.    There are no Patient Instructions on file for this visit. No follow-ups on file.   Georgiana Spinner, NP

## 2020-03-09 ENCOUNTER — Encounter (INDEPENDENT_AMBULATORY_CARE_PROVIDER_SITE_OTHER): Payer: Self-pay | Admitting: Nurse Practitioner

## 2020-03-09 NOTE — Progress Notes (Signed)
This is the result prior to most recent visit.  These results were discussed with patients and changes implemented to reduce INR . No action necessary .

## 2020-03-13 ENCOUNTER — Other Ambulatory Visit (INDEPENDENT_AMBULATORY_CARE_PROVIDER_SITE_OTHER): Payer: Self-pay | Admitting: Nurse Practitioner

## 2020-03-14 LAB — PROTIME-INR
INR: 1.9 — ABNORMAL HIGH (ref 0.9–1.2)
Prothrombin Time: 19.5 s — ABNORMAL HIGH (ref 9.1–12.0)

## 2020-03-18 NOTE — Progress Notes (Signed)
Let's have him take 1 full tablet on Mondays and stay with 1/2 a tab the rest of days.  We'll give him a form to check at this upcoming follow up visit

## 2020-03-18 NOTE — Progress Notes (Signed)
Patient was made with medical recommendations and verbalized understanding

## 2020-03-24 ENCOUNTER — Ambulatory Visit (INDEPENDENT_AMBULATORY_CARE_PROVIDER_SITE_OTHER): Payer: PRIVATE HEALTH INSURANCE | Admitting: Nurse Practitioner

## 2020-03-24 ENCOUNTER — Encounter (INDEPENDENT_AMBULATORY_CARE_PROVIDER_SITE_OTHER): Payer: PRIVATE HEALTH INSURANCE

## 2020-03-25 ENCOUNTER — Ambulatory Visit (INDEPENDENT_AMBULATORY_CARE_PROVIDER_SITE_OTHER): Payer: PRIVATE HEALTH INSURANCE | Admitting: Nurse Practitioner

## 2020-03-25 ENCOUNTER — Other Ambulatory Visit: Payer: Self-pay

## 2020-03-25 ENCOUNTER — Encounter (INDEPENDENT_AMBULATORY_CARE_PROVIDER_SITE_OTHER): Payer: Self-pay | Admitting: Nurse Practitioner

## 2020-03-25 VITALS — BP 171/93 | HR 83 | Ht 68.0 in | Wt 298.0 lb

## 2020-03-25 DIAGNOSIS — I10 Essential (primary) hypertension: Secondary | ICD-10-CM | POA: Diagnosis not present

## 2020-03-25 DIAGNOSIS — I82412 Acute embolism and thrombosis of left femoral vein: Secondary | ICD-10-CM | POA: Diagnosis not present

## 2020-03-25 DIAGNOSIS — I890 Lymphedema, not elsewhere classified: Secondary | ICD-10-CM

## 2020-03-29 ENCOUNTER — Encounter (INDEPENDENT_AMBULATORY_CARE_PROVIDER_SITE_OTHER): Payer: Self-pay | Admitting: Nurse Practitioner

## 2020-03-29 NOTE — Progress Notes (Signed)
Subjective:    Patient ID: Frank Rivera, male    DOB: May 28, 1960, 60 y.o.   MRN: [id number] Chief Complaint  Patient presents with  . Follow-up    No studies    The patient presents today for follow-up evaluation of lower extremity edema and pain in the setting of acute DVT.  The patient has a previous history of multiple DVTs.  Previous noninvasive studies show no worsening of his DVT besides the previous acute DVT that was found and that is actually showing signs symptoms of improvement.  Patient continues with his Coumadin as prescribed.  Most recent INR was 1.9.  The patient denies any fever, chills, nausea, vomiting or diarrhea.  The pain he notes that his legs has decreased somewhat.  Overall patient is doing well.   Review of Systems  Cardiovascular: Positive for leg swelling.  All other systems reviewed and are negative.      Objective:   Physical Exam Vitals reviewed.  HENT:     Head: Normocephalic.  Cardiovascular:     Rate and Rhythm: Normal rate.  Pulmonary:     Effort: Pulmonary effort is normal.  Musculoskeletal:     Right lower leg: Edema present.     Left lower leg: Edema present.  Neurological:     Mental Status: He is alert and oriented to person, place, and time.  Psychiatric:        Mood and Affect: Mood normal.        Behavior: Behavior normal.        Thought Content: Thought content normal.        Judgment: Judgment normal.     BP (!) 171/93   Pulse 83   Ht 5\' 8"  (1.727 m)   Wt 298 lb (135.2 kg)   BMI 45.31 kg/m   Past Medical History:  Diagnosis Date  . Dyspnea   . Dyspnea on exertion   . Elevated lipids   . Hx of blood clots    legs  . Pulmonary emboli ([place])   . Pulmonary embolism ([place]) 2011  . Pulmonary embolism ([place])    2017    Social History   Socioeconomic History  . Marital status: Married    Spouse name: Not on file  . Number of children: Not on file  . Years of education: Not on file  . Highest education level: Not on  file  Occupational History  . Not on file  Tobacco Use  . Smoking status: Former Smoker    Packs/day: 1.00    Years: 25.00    Pack years: 25.00    Types: Cigarettes    Quit date: 11/17/2015    Years since quitting: 4.3  . Smokeless tobacco: Never Used  Vaping Use  . Vaping Use: Never used  Substance and Sexual Activity  . Alcohol use: Yes    Comment: rare  . Drug use: No  . Sexual activity: Yes    Partners: Female  Other Topics Concern  . Not on file  Social History Narrative  . Not on file   Social Determinants of Health   Financial Resource Strain: Not on file  Food Insecurity: Not on file  Transportation Needs: Not on file  Physical Activity: Not on file  Stress: Not on file  Social Connections: Not on file  Intimate Partner Violence: Not on file    Past Surgical History:  Procedure Laterality Date  . APPENDECTOMY     as child  . IVC FILTER  INSERTION Right 02/21/2018   Procedure: IVC FILTER INSERTION;  Surgeon: Renford Dills, MD;  Location: [facility];  Service: Cardiovascular;  Laterality: Right;  . IVC FILTER REMOVAL N/A 07/31/2018   Procedure: IVC FILTER REMOVAL;  Surgeon: Renford Dills, MD;  Location: [facility];  Service: Cardiovascular;  Laterality: N/A;  . TONSILLECTOMY AND ADENOIDECTOMY     as child  . TOTAL KNEE ARTHROPLASTY Bilateral 02/27/2018   Procedure: TOTAL KNEE BILATERAL;  Surgeon: Kennedy Bucker, MD;  Location: [facility];  Service: Orthopedics;  Laterality: Bilateral;    Family History  Problem Relation Age of Onset  . Melanoma Father        face  . Colon cancer Paternal Uncle        died of colon ca - dx at age 30's  . Diabetes Neg Hx     Allergies  Allergen Reactions  . Sulfa Antibiotics Other (See Comments)    As a child-told he had reaction to sulfa drugs.    CBC Latest Ref Rng & Units 01/29/2020 03/05/2018 03/04/2018  WBC 3.4 - 10.8 x10E3/uL 5.6 6.6 7.8  Hemoglobin 13.0 - 17.7 g/dL 36.1 4.4(R) 1.5(Q)   Hematocrit 37.5 - 51.0 % 39.5 25.8(L) 26.3(L)  Platelets 150 - 450 x10E3/uL 261 219 213      CMP     Component Value Date/Time   NA 137 03/03/2018 0308   K 3.8 03/03/2018 0308   CL 101 03/03/2018 0308   CO2 25 03/03/2018 0308   GLUCOSE 125 (H) 03/03/2018 0308   BUN 29 (H) 03/03/2018 0308   CREATININE 0.63 03/06/2018 0451   CALCIUM 8.4 (L) 03/03/2018 0308   PROT 7.4 11/18/2015 1804   ALBUMIN 3.5 11/18/2015 1804   AST 17 11/18/2015 1804   ALT 27 11/18/2015 1804   ALKPHOS 90 11/18/2015 1804   BILITOT 1.7 (H) 11/18/2015 1804   GFRNONAA >60 03/06/2018 0451   GFRAA >60 03/06/2018 0451     No results found.     Assessment & Plan:   1. Acute deep vein thrombosis (DVT) of left femoral vein (HCC) Previous noninvasive studies have shown that the DVT has decreased in size.  Patient will continue with anticoagulation as planned and we will plan on regular INR follow-ups based on previous INR results.  By the patient return to the office in 3 months to discuss anticoagulation.  2. Lymphedema No surgery or intervention at this point in time.  I have reviewed my discussion with the patient regarding venous insufficiency and why it causes symptoms. I have discussed with the patient the chronic skin changes that accompany venous insufficiency and the long term sequela such as ulceration. Patient will contnue wearing graduated compression stockings on a daily basis, as this has provided excellent control of his edema. The patient will put the stockings on first thing in the morning and removing them in the evening. The patient is reminded not to sleep in the stockings.  In addition, behavioral modification including elevation during the day will be initiated. Exercise is strongly encouraged.   3. Essential hypertension Continue antihypertensive medications as already ordered, these medications have been reviewed and there are no changes at this time.    Current Outpatient Medications  on File Prior to Visit  Medication Sig Dispense Refill  . aspirin EC 81 MG tablet Take 81 mg by mouth daily. Swallow whole.    . diphenhydramine-acetaminophen (TYLENOL PM) 25-500 MG TABS tablet Take 1-2 tablets by mouth at bedtime.    Marland Kitchen  methocarbamol (ROBAXIN) 500 MG tablet Take 1 tablet (500 mg total) by mouth every 6 (six) hours as needed for muscle spasms. 30 tablet 0  . metoprolol tartrate (LOPRESSOR) 25 MG tablet Take 1 tablet (25 mg total) by mouth 2 (two) times daily. 60 tablet 11  . oxyCODONE (OXY IR/ROXICODONE) 5 MG immediate release tablet Take 5 mg by mouth 2 (two) times a day.    . simvastatin (ZOCOR) 40 MG tablet Take 60 mg by mouth at bedtime.     Marland Kitchen warfarin (COUMADIN) 5 MG tablet Take 1 tablet (5 mg total) by mouth one time only at 4 PM for 180 doses. 30 tablet 5   No current facility-administered medications on file prior to visit.    There are no Patient Instructions on file for this visit. No follow-ups on file.   Georgiana Spinner, NP

## 2020-04-07 ENCOUNTER — Other Ambulatory Visit (INDEPENDENT_AMBULATORY_CARE_PROVIDER_SITE_OTHER): Payer: Self-pay | Admitting: Nurse Practitioner

## 2020-04-08 LAB — BASIC METABOLIC PANEL
BUN/Creatinine Ratio: 9 (ref 9–20)
BUN: 8 mg/dL (ref 6–24)
CO2: 25 mmol/L (ref 20–29)
Calcium: 9.8 mg/dL (ref 8.7–10.2)
Chloride: 103 mmol/L (ref 96–106)
Creatinine, Ser: 0.87 mg/dL (ref 0.76–1.27)
GFR calc Af Amer: 111 mL/min/{1.73_m2} (ref >59)
GFR calc non Af Amer: 96 mL/min/{1.73_m2} (ref >59)
Glucose: 138 mg/dL — ABNORMAL HIGH (ref 65–99)
Potassium: 4.2 mmol/L (ref 3.5–5.2)
Sodium: 142 mmol/L (ref 134–144)

## 2020-04-08 LAB — PROTIME-INR
INR: 2.9 — ABNORMAL HIGH (ref 0.9–1.2)
Prothrombin Time: 29.4 s — ABNORMAL HIGH (ref 9.1–12.0)

## 2020-04-09 NOTE — Progress Notes (Signed)
Have him return to the 1/2 tab daily as he is at 3 today.  We can check it again in a month

## 2020-04-09 NOTE — Progress Notes (Signed)
Patient was made aware with medical recommendations and verbalized that he will be coming by the office at a later time to pick up the lab sheet

## 2020-06-15 ENCOUNTER — Ambulatory Visit (INDEPENDENT_AMBULATORY_CARE_PROVIDER_SITE_OTHER): Payer: Self-pay | Admitting: Vascular Surgery

## 2020-06-15 ENCOUNTER — Other Ambulatory Visit: Payer: Self-pay

## 2020-06-15 ENCOUNTER — Encounter (INDEPENDENT_AMBULATORY_CARE_PROVIDER_SITE_OTHER): Payer: Self-pay | Admitting: Vascular Surgery

## 2020-06-15 VITALS — BP 161/91 | HR 77 | Resp 16 | Wt 303.0 lb

## 2020-06-15 DIAGNOSIS — I890 Lymphedema, not elsewhere classified: Secondary | ICD-10-CM

## 2020-06-15 DIAGNOSIS — E7800 Pure hypercholesterolemia, unspecified: Secondary | ICD-10-CM

## 2020-06-15 DIAGNOSIS — I87009 Postthrombotic syndrome without complications of unspecified extremity: Secondary | ICD-10-CM

## 2020-06-15 NOTE — Progress Notes (Signed)
MRN : [id number]  Frank Rivera is a 60 y.o. (1960/08/11) male who presents with chief complaint of No chief complaint on file. Marland Kitchen  History of Present Illness:   The patient returns to the office for followup evaluation regarding leg swelling and increasing discoloration.  The swelling has persisted and the pain associated with swelling continues. There have not been any interval development of a ulcerations or wounds.  Since the previous visit the patient has not been wearing graduated compression stockings because he noted little if any improvement in the lymphedema.   The patient also states elevation during the day and exercise is being done too.   No outpatient medications have been marked as taking for the 06/15/20 encounter (Appointment) with Gilda Crease, Latina Craver, MD.    Past Medical History:  Diagnosis Date  . Dyspnea   . Dyspnea on exertion   . Elevated lipids   . Hx of blood clots    legs  . Pulmonary emboli (HCC)   . Pulmonary embolism (HCC) 2011  . Pulmonary embolism (HCC)    2017    Past Surgical History:  Procedure Laterality Date  . APPENDECTOMY     as child  . IVC FILTER INSERTION Right 02/21/2018   Procedure: IVC FILTER INSERTION;  Surgeon: Renford Dills, MD;  Location: [facility];  Service: Cardiovascular;  Laterality: Right;  . IVC FILTER REMOVAL N/A 07/31/2018   Procedure: IVC FILTER REMOVAL;  Surgeon: Renford Dills, MD;  Location: [facility];  Service: Cardiovascular;  Laterality: N/A;  . TONSILLECTOMY AND ADENOIDECTOMY     as child  . TOTAL KNEE ARTHROPLASTY Bilateral 02/27/2018   Procedure: TOTAL KNEE BILATERAL;  Surgeon: Kennedy Bucker, MD;  Location: [facility];  Service: Orthopedics;  Laterality: Bilateral;    Social History Social History   Tobacco Use  . Smoking status: Former Smoker    Packs/day: 1.00    Years: 25.00    Pack years: 25.00    Types: Cigarettes    Quit date: 11/17/2015    Years since quitting: 4.5  .  Smokeless tobacco: Never Used  Vaping Use  . Vaping Use: Never used  Substance Use Topics  . Alcohol use: Yes    Comment: rare  . Drug use: No    Family History Family History  Problem Relation Age of Onset  . Melanoma Father        face  . Colon cancer Paternal Uncle        died of colon ca - dx at age 63's  . Diabetes Neg Hx     Allergies  Allergen Reactions  . Sulfa Antibiotics Other (See Comments)    As a child-told he had reaction to sulfa drugs.     REVIEW OF SYSTEMS (Negative unless checked)  Constitutional: [] Weight loss  [] Fever  [] Chills Cardiac: [] Chest pain   [] Chest pressure   [] Palpitations   [] Shortness of breath when laying flat   [] Shortness of breath with exertion. Vascular:  [] Pain in legs with walking   [] Pain in legs at rest  [] History of DVT   [] Phlebitis   [x] Swelling in legs   [] Varicose veins   [] Non-healing ulcers Pulmonary:   [] Uses home oxygen   [] Productive cough   [] Hemoptysis   [] Wheeze  [] COPD   [] Asthma Neurologic:  [] Dizziness   [] Seizures   [] History of stroke   [] History of TIA  [] Aphasia   [] Vissual changes   [] Weakness or numbness in arm   []   Weakness or numbness in leg Musculoskeletal:   [] Joint swelling   [] Joint pain   [] Low back pain Hematologic:  [] Easy bruising  [] Easy bleeding   [] Hypercoagulable state   [] Anemic Gastrointestinal:  [] Diarrhea   [] Vomiting  [] Gastroesophageal reflux/heartburn   [] Difficulty swallowing. Genitourinary:  [] Chronic kidney disease   [] Difficult urination  [] Frequent urination   [] Blood in urine Skin:  [x] Rashes   [] Ulcers  Psychological:  [] History of anxiety   []  History of major depression.  Physical Examination  There were no vitals filed for this visit. There is no height or weight on file to calculate BMI. Gen: WD/WN, NAD Head: [place]/AT, No temporalis wasting.  Ear/Nose/Throat: Hearing grossly intact, nares w/o erythema or drainage Eyes: PER, EOMI, sclera nonicteric.  Neck: Supple, no large  masses.   Pulmonary:  Good air movement, no audible wheezing bilaterally, no use of accessory muscles.  Cardiac: RRR, no JVD Vascular: scattered varicosities present bilaterally.  Moderate to severe venous stasis changes to the legs bilaterally.  3+  hard pitting edema. Vessel Right Left  Radial Palpable Palpable  Gastrointestinal: Non-distended. No guarding/no peritoneal signs.  Musculoskeletal: M/S 5/5 throughout.  No deformity or atrophy.  Neurologic: CN 2-12 intact. Symmetrical.  Speech is fluent. Motor exam as listed above. Psychiatric: Judgment intact, Mood & affect appropriate for pt's clinical situation. Dermatologic: Venous rashes no ulcers noted.  No changes consistent with cellulitis. Lymph : + lichenification or skin changes of chronic lymphedema.  CBC Lab Results  Component Value Date   WBC 5.6 01/29/2020   HGB 13.4 01/29/2020   HCT 39.5 01/29/2020   MCV 96 01/29/2020   PLT 261 01/29/2020    BMET    Component Value Date/Time   NA 142 04/07/2020 0852   K 4.2 04/07/2020 0852   CL 103 04/07/2020 0852   CO2 25 04/07/2020 0852   GLUCOSE 138 (H) 04/07/2020 0852   GLUCOSE 125 (H) 03/03/2018 0308   BUN 8 04/07/2020 0852   CREATININE 0.87 04/07/2020 0852   CALCIUM 9.8 04/07/2020 0852   GFRNONAA 96 04/07/2020 0852   GFRAA 111 04/07/2020 0852   CrCl cannot be calculated (Patient's most recent lab result is older than the maximum 21 days allowed.).  COAG Lab Results  Component Value Date   INR 2.9 (H) 04/07/2020   INR 1.9 (H) 03/13/2020   INR 6.2 (HH) 03/03/2020    Radiology No results found.   Assessment/Plan 1. 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  I have reviewed my discussion with the patient regarding venous insufficiency and why it causes symptoms. I have discussed with the patient the chronic skin changes that accompany venous insufficiency and the long term sequela such as ulceration. Patient  should wear graduated compression stockings on a daily basis. Compression wraps were also reviewed.  Lymph pump was also discussed and the patient said he will think about it.  In addition, behavioral modification including elevation during the day will be initiated.  Exercise is encouraged.  The patient will follow up with me PRN should anything change.  The patient voices agreement with this plan.   2. Hypercholesteremia Continue statin as ordered and reviewed, no changes at this time   3. Lymphedema No surgery or intervention at this point in time.  I have reviewed my discussion with the patient regarding venous insufficiency and why it causes symptoms. I have discussed with the patient the chronic skin changes that accompany venous insufficiency and the long term  sequela such as ulceration. Patient should wear graduated compression stockings on a daily basis. Compression wraps were also reviewed.  Lymph pump was also discussed and the patient said he will think about it.  In addition, behavioral modification including elevation during the day will be initiated.  Exercise is encouraged.  The patient will follow up with me PRN should anything change.  The patient voices agreement with this plan.   Levora Dredge, MD  06/15/2020 8:49 AM

## 2020-12-26 ENCOUNTER — Other Ambulatory Visit (INDEPENDENT_AMBULATORY_CARE_PROVIDER_SITE_OTHER): Payer: Self-pay | Admitting: Nurse Practitioner

## 2020-12-26 DIAGNOSIS — I82412 Acute embolism and thrombosis of left femoral vein: Secondary | ICD-10-CM

## 2021-04-08 ENCOUNTER — Other Ambulatory Visit (INDEPENDENT_AMBULATORY_CARE_PROVIDER_SITE_OTHER): Payer: Self-pay | Admitting: Nurse Practitioner

## 2021-04-08 DIAGNOSIS — I10 Essential (primary) hypertension: Secondary | ICD-10-CM

## 2021-04-16 ENCOUNTER — Other Ambulatory Visit (INDEPENDENT_AMBULATORY_CARE_PROVIDER_SITE_OTHER): Payer: Self-pay | Admitting: Nurse Practitioner

## 2021-04-16 DIAGNOSIS — I10 Essential (primary) hypertension: Secondary | ICD-10-CM

## 2021-08-04 ENCOUNTER — Other Ambulatory Visit (INDEPENDENT_AMBULATORY_CARE_PROVIDER_SITE_OTHER): Payer: Self-pay | Admitting: Nurse Practitioner

## 2021-08-04 DIAGNOSIS — I82412 Acute embolism and thrombosis of left femoral vein: Secondary | ICD-10-CM

## 2021-09-21 ENCOUNTER — Other Ambulatory Visit: Payer: Self-pay | Admitting: *Deleted

## 2021-09-21 DIAGNOSIS — Z87891 Personal history of nicotine dependence: Secondary | ICD-10-CM

## 2021-09-21 DIAGNOSIS — Z122 Encounter for screening for malignant neoplasm of respiratory organs: Secondary | ICD-10-CM

## 2021-10-06 ENCOUNTER — Ambulatory Visit (INDEPENDENT_AMBULATORY_CARE_PROVIDER_SITE_OTHER): Payer: No Typology Code available for payment source | Admitting: Acute Care

## 2021-10-06 ENCOUNTER — Encounter: Payer: Self-pay | Admitting: Acute Care

## 2021-10-06 DIAGNOSIS — Z87891 Personal history of nicotine dependence: Secondary | ICD-10-CM

## 2021-10-06 NOTE — Patient Instructions (Signed)
Thank you for participating in the [facility]. It was our pleasure to meet you today. We will call you with the results of your scan within the next few days. Your scan will be assigned a Lung RADS category score by the physicians reading the scans.  This Lung RADS score determines follow up scanning.  See below for description of categories, and follow up screening recommendations. We will be in touch to schedule your follow up screening annually or based on recommendations of our providers. We will fax a copy of your scan results to your Primary Care Physician, or the physician who referred you to the program, to ensure they have the results. Please call the office if you have any questions or concerns regarding your scanning experience or results.  Our office number is [phone]. Please speak with Denise Phelps, RN. , or  Denise Buckner RN, They are  our Lung Cancer Screening RN.'s If They are unavailable when you call, Please leave a message on the voice mail. We will return your call at our earliest convenience.This voice mail is monitored several times a day.  Remember, if your scan is normal, we will scan you annually as long as you continue to meet the criteria for the program. (Age 55-77, Current smoker or smoker who has quit within the last 15 years). If you are a smoker, remember, quitting is the single most powerful action that you can take to decrease your risk of lung cancer and other pulmonary, breathing related problems. We know quitting is hard, and we are here to help.  Please let us know if there is anything we can do to help you meet your goal of quitting. If you are a former smoker, congratulations. We are proud of you! Remain smoke free! Remember you can refer friends or family members through the number above.  We will screen them to make sure they meet criteria for the program. Thank you for helping us take better care of you by  participating in Lung Screening.  You can receive free nicotine replacement therapy ( patches, gum or mints) by calling 1-800-QUIT NOW. Please call so we can get you on the path to becoming  a non-smoker. I know it is hard, but you can do this!  Lung RADS Categories:  Lung RADS 1: no nodules or definitely non-concerning nodules.  Recommendation is for a repeat annual scan in 12 months.  Lung RADS 2:  nodules that are non-concerning in appearance and behavior with a very low likelihood of becoming an active cancer. Recommendation is for a repeat annual scan in 12 months.  Lung RADS 3: nodules that are probably non-concerning , includes nodules with a low likelihood of becoming an active cancer.  Recommendation is for a 6-month repeat screening scan. Often noted after an upper respiratory illness. We will be in touch to make sure you have no questions, and to schedule your 6-month scan.  Lung RADS 4 A: nodules with concerning findings, recommendation is most often for a follow up scan in 3 months or additional testing based on our provider's assessment of the scan. We will be in touch to make sure you have no questions and to schedule the recommended 3 month follow up scan.  Lung RADS 4 B:  indicates findings that are concerning. We will be in touch with you to schedule additional diagnostic testing based on our provider's  assessment of the scan.  Other options for assistance in smoking cessation (   As covered by your insurance benefits)  Hypnosis for smoking cessation  [employer] [phone]  Acupuncture for smoking cessation  [facility] [phone]   

## 2021-10-06 NOTE — Progress Notes (Signed)
Virtual Visit via Telephone Note  I connected with Frank Rivera on 10/06/21 at  9:30 AM EDT by telephone and verified that I am speaking with the correct person using two identifiers.  Location: Patient:  At home Provider:  [address] [place], [place], [address]    I discussed the limitations, risks, security and privacy concerns of performing an evaluation and management service by telephone and the availability of in person appointments. I also discussed with the patient that there may be a patient responsible charge related to this service. The patient expressed understanding and agreed to proceed.   Shared Decision Making Visit Lung Cancer Screening Program [phone])   Eligibility: Age 61 y.o. Pack Years Smoking History Calculation 28 pack year smoking history (# packs/per year x # years smoked) Recent History of coughing up blood  no Unexplained weight loss? no ( >Than 15 pounds within the last 6 months ) Prior History Lung / other cancer no (Diagnosis within the last 5 years already requiring surveillance chest CT Scans). Smoking Status Former Smoker Former Smokers: Years since quit: 5 years  Quit Date: 2018  Visit Components: Discussion included one or more decision making aids. yes Discussion included risk/benefits of screening. yes Discussion included potential follow up diagnostic testing for abnormal scans. yes Discussion included meaning and risk of over diagnosis. yes Discussion included meaning and risk of False Positives. yes Discussion included meaning of total radiation exposure. yes  Counseling Included: Importance of adherence to annual lung cancer LDCT screening. yes Impact of comorbidities on ability to participate in the program. yes Ability and willingness to under diagnostic treatment. yes  Smoking Cessation Counseling: Current Smokers:  Discussed importance of smoking cessation. yes Information about tobacco cessation classes and  interventions provided to patient. yes Patient provided with "ticket" for LDCT Scan. yes Symptomatic Patient. yes  Counseling NA Diagnosis Code: Tobacco Use Z72.0 Asymptomatic Patient yes  Counseling (Intermediate counseling: > three minutes counseling) [id number] Former Smokers:  Discussed the importance of maintaining cigarette abstinence. yes Diagnosis Code: Personal History of Nicotine Dependence. D66.440 Information about tobacco cessation classes and interventions provided to patient. Yes Patient provided with "ticket" for LDCT Scan. yes Written Order for Lung Cancer Screening with LDCT placed in Epic. Yes (CT Chest Lung Cancer Screening Low Dose W/O CM) [id number] Z12.2-Screening of respiratory organs Z87.891-Personal history of nicotine dependence  I spent 25 minutes of face to face time/virtual visit time  with  Mr. Dobratz discussing the risks and benefits of lung cancer screening. We took the time to pause the power point at intervals to allow for questions to be asked and answered to ensure understanding. We discussed that he had taken the single most powerful action possible to decrease his risk of developing lung cancer when he quit smoking. I counseled him to remain smoke free, and to contact me if he ever had the desire to smoke again so that I can provide resources and tools to help support the effort to remain smoke free. We discussed the time and location of the scan, and that either  Abigail Miyamoto RN, Karlton Lemon, RN or I  or I will call / send a letter with the results within  24-72 hours of receiving them. He has the office contact information in the event he needs to speak with me,  he verbalized understanding of all of the above and had no further questions upon leaving the office.     I explained to the patient that there  has been a high incidence of coronary artery disease noted on these exams. I explained that this is a non-gated exam therefore degree or severity cannot be  determined. This patient is on statin therapy. I have asked the patient to follow-up with their PCP regarding any incidental finding of coronary artery disease and management with diet or medication as they feel is clinically indicated. The patient verbalized understanding of the above and had no further questions.     Bevelyn Ngo, NP 10/06/2021

## 2021-10-07 ENCOUNTER — Ambulatory Visit
Admission: RE | Admit: 2021-10-07 | Discharge: 2021-10-07 | Disposition: A | Discharge status: Home / Self Care | Payer: No Typology Code available for payment source | Source: Ambulatory Visit | Attending: Acute Care | Admitting: Acute Care

## 2021-10-07 DIAGNOSIS — Z122 Encounter for screening for malignant neoplasm of respiratory organs: Secondary | ICD-10-CM | POA: Diagnosis present

## 2021-10-07 DIAGNOSIS — Z87891 Personal history of nicotine dependence: Secondary | ICD-10-CM | POA: Insufficient documentation

## 2021-10-08 ENCOUNTER — Other Ambulatory Visit: Payer: Self-pay | Admitting: Acute Care

## 2021-10-08 DIAGNOSIS — Z87891 Personal history of nicotine dependence: Secondary | ICD-10-CM

## 2021-10-08 DIAGNOSIS — Z122 Encounter for screening for malignant neoplasm of respiratory organs: Secondary | ICD-10-CM

## 2022-01-24 ENCOUNTER — Encounter (INDEPENDENT_AMBULATORY_CARE_PROVIDER_SITE_OTHER): Payer: Self-pay

## 2022-05-13 ENCOUNTER — Other Ambulatory Visit (INDEPENDENT_AMBULATORY_CARE_PROVIDER_SITE_OTHER): Payer: Self-pay | Admitting: Nurse Practitioner

## 2022-05-13 DIAGNOSIS — I82412 Acute embolism and thrombosis of left femoral vein: Secondary | ICD-10-CM

## 2022-08-29 ENCOUNTER — Other Ambulatory Visit: Payer: Self-pay | Admitting: Acute Care

## 2022-08-29 DIAGNOSIS — Z87891 Personal history of nicotine dependence: Secondary | ICD-10-CM

## 2022-08-29 DIAGNOSIS — Z122 Encounter for screening for malignant neoplasm of respiratory organs: Secondary | ICD-10-CM

## 2022-10-10 ENCOUNTER — Ambulatory Visit: Admission: RE | Admit: 2022-10-10 | Payer: No Typology Code available for payment source | Source: Ambulatory Visit

## 2022-11-02 ENCOUNTER — Ambulatory Visit
Admission: RE | Admit: 2022-11-02 | Discharge: 2022-11-02 | Disposition: A | Discharge status: Home / Self Care | Payer: No Typology Code available for payment source | Source: Ambulatory Visit | Attending: Acute Care | Admitting: Acute Care

## 2022-11-02 DIAGNOSIS — Z122 Encounter for screening for malignant neoplasm of respiratory organs: Secondary | ICD-10-CM | POA: Insufficient documentation

## 2022-11-02 DIAGNOSIS — Z87891 Personal history of nicotine dependence: Secondary | ICD-10-CM | POA: Diagnosis not present

## 2022-11-07 ENCOUNTER — Other Ambulatory Visit: Payer: Self-pay | Admitting: Acute Care

## 2022-11-07 DIAGNOSIS — Z122 Encounter for screening for malignant neoplasm of respiratory organs: Secondary | ICD-10-CM

## 2022-11-07 DIAGNOSIS — Z87891 Personal history of nicotine dependence: Secondary | ICD-10-CM

## 2022-11-07 DIAGNOSIS — F17210 Nicotine dependence, cigarettes, uncomplicated: Secondary | ICD-10-CM

## 2023-03-06 ENCOUNTER — Other Ambulatory Visit (INDEPENDENT_AMBULATORY_CARE_PROVIDER_SITE_OTHER): Payer: Self-pay | Admitting: Nurse Practitioner

## 2023-03-06 DIAGNOSIS — I82412 Acute embolism and thrombosis of left femoral vein: Secondary | ICD-10-CM

## 2023-08-15 ENCOUNTER — Encounter (INDEPENDENT_AMBULATORY_CARE_PROVIDER_SITE_OTHER): Payer: Self-pay

## 2023-10-25 ENCOUNTER — Encounter: Payer: Self-pay | Admitting: Acute Care

## 2023-12-19 ENCOUNTER — Other Ambulatory Visit: Payer: Self-pay | Admitting: Acute Care

## 2023-12-19 DIAGNOSIS — Z87891 Personal history of nicotine dependence: Secondary | ICD-10-CM

## 2023-12-19 DIAGNOSIS — Z122 Encounter for screening for malignant neoplasm of respiratory organs: Secondary | ICD-10-CM

## 2023-12-19 DIAGNOSIS — F17210 Nicotine dependence, cigarettes, uncomplicated: Secondary | ICD-10-CM

## 2024-06-05 ENCOUNTER — Ambulatory Visit: Payer: PRIVATE HEALTH INSURANCE | Admitting: Family
